# Patient Record
Sex: Male | Born: 1955 | Race: Black or African American | Hispanic: No | Marital: Married | Smoking: Never smoker
Health system: Southern US, Community
[De-identification: ages and names within clinical notes are randomized; demographics above are authoritative.]

## PROBLEM LIST (undated history)

## (undated) DIAGNOSIS — E669 Obesity, unspecified: Secondary | ICD-10-CM

## (undated) DIAGNOSIS — J189 Pneumonia, unspecified organism: Secondary | ICD-10-CM

## (undated) DIAGNOSIS — I5032 Chronic diastolic (congestive) heart failure: Secondary | ICD-10-CM

## (undated) DIAGNOSIS — D649 Anemia, unspecified: Secondary | ICD-10-CM

## (undated) DIAGNOSIS — N189 Chronic kidney disease, unspecified: Secondary | ICD-10-CM

## (undated) DIAGNOSIS — I10 Essential (primary) hypertension: Secondary | ICD-10-CM

## (undated) DIAGNOSIS — G4733 Obstructive sleep apnea (adult) (pediatric): Secondary | ICD-10-CM

## (undated) DIAGNOSIS — J9601 Acute respiratory failure with hypoxia: Secondary | ICD-10-CM

## (undated) HISTORY — DX: Obstructive sleep apnea (adult) (pediatric): G47.33

## (undated) HISTORY — PX: AV FISTULA PLACEMENT: SHX1204

## (undated) HISTORY — DX: Obesity, unspecified: E66.9

## (undated) HISTORY — DX: Chronic kidney disease, unspecified: N18.9

---

## 2002-01-27 ENCOUNTER — Emergency Department (HOSPITAL_COMMUNITY): Admission: EM | Admit: 2002-01-27 | Discharge: 2002-01-27 | Payer: Self-pay | Admitting: Emergency Medicine

## 2004-11-18 ENCOUNTER — Emergency Department (HOSPITAL_COMMUNITY): Admission: EM | Admit: 2004-11-18 | Discharge: 2004-11-18 | Payer: Self-pay | Admitting: Emergency Medicine

## 2006-08-25 ENCOUNTER — Emergency Department (HOSPITAL_COMMUNITY): Admission: EM | Admit: 2006-08-25 | Discharge: 2006-08-25 | Payer: Self-pay | Admitting: Family Medicine

## 2007-12-14 ENCOUNTER — Emergency Department (HOSPITAL_COMMUNITY): Admission: EM | Admit: 2007-12-14 | Discharge: 2007-12-14 | Payer: Self-pay | Admitting: Emergency Medicine

## 2007-12-16 ENCOUNTER — Emergency Department (HOSPITAL_COMMUNITY): Admission: EM | Admit: 2007-12-16 | Discharge: 2007-12-16 | Payer: Self-pay | Admitting: Family Medicine

## 2008-01-11 ENCOUNTER — Encounter
Admission: RE | Admit: 2008-01-11 | Discharge: 2008-01-11 | Payer: Self-pay | Admitting: Physical Medicine and Rehabilitation

## 2008-03-08 ENCOUNTER — Emergency Department (HOSPITAL_COMMUNITY): Admission: EM | Admit: 2008-03-08 | Discharge: 2008-03-08 | Payer: Self-pay | Admitting: Emergency Medicine

## 2011-06-16 LAB — POCT I-STAT, CHEM 8
BUN: 18
Calcium, Ion: 1.12
Chloride: 100
Creatinine, Ser: 1.3
Glucose, Bld: 287 — ABNORMAL HIGH
HCT: 43
Hemoglobin: 14.6
Potassium: 3.4 — ABNORMAL LOW
Sodium: 135
TCO2: 24

## 2011-06-16 LAB — POCT CARDIAC MARKERS
CKMB, poc: 1.1
CKMB, poc: 1.7
Myoglobin, poc: 56.5
Myoglobin, poc: 97.4
Operator id: 272551
Operator id: 277751
Troponin i, poc: <0.05
Troponin i, poc: <0.05

## 2011-06-16 LAB — BASIC METABOLIC PANEL WITH GFR
CO2: 23
Chloride: 99
Creatinine, Ser: 1.1
GFR calc Af Amer: >60
GFR calc non Af Amer: >60
Glucose, Bld: 295 — ABNORMAL HIGH
Potassium: 3.3 — ABNORMAL LOW
Sodium: 133 — ABNORMAL LOW

## 2011-06-16 LAB — DIFFERENTIAL
Monocytes Absolute: 0.5
Neutrophils Relative %: 38 — ABNORMAL LOW

## 2011-06-16 LAB — CBC
Hemoglobin: 14
MCHC: 34.4
RBC: 4.82
RDW: 14

## 2011-06-16 LAB — BASIC METABOLIC PANEL
BUN: 16
Calcium: 9.1

## 2011-06-16 LAB — PROTIME-INR
INR: 0.9
Prothrombin Time: 11.8

## 2011-06-16 LAB — D-DIMER, QUANTITATIVE: D-Dimer, Quant: <0.22

## 2011-06-16 LAB — APTT: aPTT: 23 — ABNORMAL LOW

## 2011-08-08 ENCOUNTER — Emergency Department (HOSPITAL_COMMUNITY)
Admission: EM | Admit: 2011-08-08 | Discharge: 2011-08-09 | Disposition: A | Discharge status: Home / Self Care | Payer: Commercial Indemnity | Attending: Emergency Medicine | Admitting: Emergency Medicine

## 2011-08-08 DIAGNOSIS — R109 Unspecified abdominal pain: Secondary | ICD-10-CM | POA: Insufficient documentation

## 2011-08-08 DIAGNOSIS — J3489 Other specified disorders of nose and nasal sinuses: Secondary | ICD-10-CM | POA: Insufficient documentation

## 2011-08-08 DIAGNOSIS — J029 Acute pharyngitis, unspecified: Secondary | ICD-10-CM | POA: Insufficient documentation

## 2011-08-08 DIAGNOSIS — R05 Cough: Secondary | ICD-10-CM | POA: Insufficient documentation

## 2011-08-08 DIAGNOSIS — R059 Cough, unspecified: Secondary | ICD-10-CM | POA: Insufficient documentation

## 2011-08-08 DIAGNOSIS — R0789 Other chest pain: Secondary | ICD-10-CM

## 2011-08-08 DIAGNOSIS — J40 Bronchitis, not specified as acute or chronic: Secondary | ICD-10-CM

## 2011-08-08 HISTORY — DX: Essential (primary) hypertension: I10

## 2011-08-09 ENCOUNTER — Encounter: Payer: Self-pay | Admitting: *Deleted

## 2011-08-09 ENCOUNTER — Emergency Department (HOSPITAL_COMMUNITY): Payer: Commercial Indemnity

## 2011-08-09 MED ORDER — ALBUTEROL SULFATE HFA 108 (90 BASE) MCG/ACT IN AERS
2.0000 | INHALATION_SPRAY | Freq: Once | RESPIRATORY_TRACT | Status: AC
  Administered 2011-08-09: 2 via RESPIRATORY_TRACT
  Filled 2011-08-09: qty 6.7

## 2011-08-09 MED ORDER — OXYCODONE-ACETAMINOPHEN 5-325 MG PO TABS
2.0000 | ORAL_TABLET | Freq: Once | ORAL | Status: AC
  Administered 2011-08-09: 2 via ORAL
  Filled 2011-08-09: qty 2

## 2011-08-09 MED ORDER — OXYCODONE-ACETAMINOPHEN 5-325 MG PO TABS: ORAL_TABLET | ORAL | Status: DC

## 2011-08-09 NOTE — ED Notes (Signed)
Patient back from xray.  Using splint pillow when coughing

## 2011-08-09 NOTE — Discharge Instructions (Signed)
 No pneumonia seen on chest xray.  No rib fractures seen.  Use inhaler 2 puffs every 6 hours as needed for difficulty breathing and coughing.      Antibiotic Nonuse  Your caregiver felt that the infection or problem was not one that would be helped with an antibiotic. Infections may be caused by viruses or bacteria. Only a caregiver can tell which one of these is the likely cause of an illness. A cold is the most common cause of infection in both adults and children. A cold is a virus. Antibiotic treatment will have no effect on a viral infection. Viruses can lead to many lost days of work caring for sick children and many missed days of school. Children may catch as many as 10 colds or flus per year during which they can be tearful, cranky, and uncomfortable. The goal of treating a virus is aimed at keeping the ill person comfortable. Antibiotics are medications used to help the body fight bacterial infections. There are relatively few types of bacteria that cause infections but there are hundreds of viruses. While both viruses and bacteria cause infection they are very different types of germs. A viral infection will typically go away by itself within 7 to 10 days. Bacterial infections may spread or get worse without antibiotic treatment. Examples of bacterial infections are:  Sore throats (like strep throat or tonsillitis).   Infection in the lung (pneumonia).   Ear and skin infections.  Examples of viral infections are:  Colds or flus.   Most coughs and bronchitis.   Sore throats not caused by Strep.   Runny noses.  It is often best not to take an antibiotic when a viral infection is the cause of the problem. Antibiotics can kill off the helpful bacteria that we have inside our body and allow harmful bacteria to start growing. Antibiotics can cause side effects such as allergies, nausea, and diarrhea without helping to improve the symptoms of the viral infection. Additionally, repeated  uses of antibiotics can cause bacteria inside of our body to become resistant. That resistance can be passed onto harmful bacterial. The next time you have an infection it may be harder to treat if antibiotics are used when they are not needed. Not treating with antibiotics allows our own immune system to develop and take care of infections more efficiently. Also, antibiotics will work better for us  when they are prescribed for bacterial infections. Treatments for a child that is ill may include:  Give extra fluids throughout the day to stay hydrated.   Get plenty of rest.   Only give your child over-the-counter or prescription medicines for pain, discomfort, or fever as directed by your caregiver.   The use of a cool mist humidifier may help stuffy noses.   Cold medications if suggested by your caregiver.  Your caregiver may decide to start you on an antibiotic if:  The problem you were seen for today continues for a longer length of time than expected.   You develop a secondary bacterial infection.  SEEK MEDICAL CARE IF:  Fever lasts longer than 5 days.   Symptoms continue to get worse after 5 to 7 days or become severe.   Difficulty in breathing develops.   Signs of dehydration develop (poor drinking, rare urinating, dark colored urine).   Changes in behavior or worsening tiredness (listlessness or lethargy).  Document Released: 11/17/2001 Document Revised: 05/21/2011 Document Reviewed: 05/16/2009 [facility] Patient Information 2012 [place], [place].Bronchitis Bronchitis is the body's way of  reacting to injury and/or infection (inflammation) of the bronchi. Bronchi are the air tubes that extend from the windpipe into the lungs. If the inflammation becomes severe, it may cause shortness of breath. CAUSES  Inflammation may be caused by:  A virus.   Germs (bacteria).   Dust.   Allergens.   Pollutants and many other irritants.  The cells lining the bronchial tree are covered  with tiny hairs (cilia). These constantly beat upward, away from the lungs, toward the mouth. This keeps the lungs free of pollutants. When these cells become too irritated and are unable to do their job, mucus begins to develop. This causes the characteristic cough of bronchitis. The cough clears the lungs when the cilia are unable to do their job. Without either of these protective mechanisms, the mucus would settle in the lungs. Then you would develop pneumonia. Smoking is a common cause of bronchitis and can contribute to pneumonia. Stopping this habit is the single most important thing you can do to help yourself. TREATMENT   Your caregiver may prescribe an antibiotic if the cough is caused by bacteria. Also, medicines that open up your airways make it easier to breathe. Your caregiver may also recommend or prescribe an expectorant. It will loosen the mucus to be coughed up. Only take over-the-counter or prescription medicines for pain, discomfort, or fever as directed by your caregiver.   Removing whatever causes the problem (smoking, for example) is critical to preventing the problem from getting worse.   Cough suppressants may be prescribed for relief of cough symptoms.   Inhaled medicines may be prescribed to help with symptoms now and to help prevent problems from returning.   For those with recurrent (chronic) bronchitis, there may be a need for steroid medicines.  SEEK IMMEDIATE MEDICAL CARE IF:   During treatment, you develop more pus-like mucus (purulent sputum).   You have a fever.   Your baby is older than 3 months with a rectal temperature of 102 F (38.9 C) or higher.   Your baby is 52 months old or younger with a rectal temperature of 100.4 F (38 C) or higher.   You become progressively more ill.   You have increased difficulty breathing, wheezing, or shortness of breath.  It is necessary to seek immediate medical care if you are elderly or sick from any other  disease. MAKE SURE YOU:   Understand these instructions.   Will watch your condition.   Will get help right away if you are not doing well or get worse.  Document Released: 09/08/2005 Document Revised: 05/21/2011 Document Reviewed: 07/18/2008 [facility] Patient Information 2012 [place], [place].Bronchitis Bronchitis is the body's way of reacting to injury and/or infection (inflammation) of the bronchi. Bronchi are the air tubes that extend from the windpipe into the lungs. If the inflammation becomes severe, it may cause shortness of breath. CAUSES  Inflammation may be caused by:  A virus.   Germs (bacteria).   Dust.   Allergens.   Pollutants and many other irritants.  The cells lining the bronchial tree are covered with tiny hairs (cilia). These constantly beat upward, away from the lungs, toward the mouth. This keeps the lungs free of pollutants. When these cells become too irritated and are unable to do their job, mucus begins to develop. This causes the characteristic cough of bronchitis. The cough clears the lungs when the cilia are unable to do their job. Without either of these protective mechanisms, the mucus would settle in the lungs.  Then you would develop pneumonia. Smoking is a common cause of bronchitis and can contribute to pneumonia. Stopping this habit is the single most important thing you can do to help yourself. TREATMENT   Your caregiver may prescribe an antibiotic if the cough is caused by bacteria. Also, medicines that open up your airways make it easier to breathe. Your caregiver may also recommend or prescribe an expectorant. It will loosen the mucus to be coughed up. Only take over-the-counter or prescription medicines for pain, discomfort, or fever as directed by your caregiver.   Removing whatever causes the problem (smoking, for example) is critical to preventing the problem from getting worse.   Cough suppressants may be prescribed for relief of cough  symptoms.   Inhaled medicines may be prescribed to help with symptoms now and to help prevent problems from returning.   For those with recurrent (chronic) bronchitis, there may be a need for steroid medicines.  SEEK IMMEDIATE MEDICAL CARE IF:   During treatment, you develop more pus-like mucus (purulent sputum).   You have a fever.    temperature of 100.4 F (38 C) or higher.   You become progressively more ill.   You have increased difficulty breathing, wheezing, or shortness of breath.  It is necessary to seek immediate medical care if you are elderly or sick from any other disease. MAKE SURE YOU:   Understand these instructions.   Will watch your condition.   Will get help right away if you are not doing well or get worse.  Document Released: 09/08/2005 Document Revised: 05/21/2011 Document Reviewed: 07/18/2008 [facility] Patient Information 2012 [place], [place].

## 2011-08-09 NOTE — ED Notes (Signed)
Patient stated about 930 tonight he started coughing and felt something pop to his right rib/abd area  Pain has continued 10/10

## 2011-08-09 NOTE — ED Provider Notes (Addendum)
History     CSN: [id number] Arrival date & time: 08/08/2011 11:56 PM   First MD Initiated Contact with Patient 08/09/11 0004      Chief Complaint  Patient presents with  . Abdominal Pain    (Consider location/radiation/quality/duration/timing/severity/associated sxs/prior treatment) HPI Comments: Patient with history of hypertension reports he has not taken his evening blood pressure medications. He is in moderate to severe pain in his right lower rib cage which he attributes to severe coughing. He reports that he began coughing earlier today. His granddaughter has been sick recently with similar symptoms. He denies known fevers. He also has been taking some Flexeril due to some left sciatica pains. He reports that he's had paroxysmal coughing described as moderate to severe and reports he began to have some pain in his right lower rib cage. More recently he had a severe episode of coughing and even though he was holding his right side felt like something crack or "busted" on that right side. He denies any radiation of the pain denies any weakness. He denies any sweats or nausea. Pain is not associated with eating. He denies smoking but has had pneumonia in the past, most recently about 2 or 3 years ago diagnosed here in the emergency department. He denies any blurred vision, focal numbness or weakness, slurred speech or any other focal symptoms related to his elevated blood pressure at this time.  Patient is a 55 y.o. male presenting with abdominal pain. The history is provided by the patient and the spouse.  Abdominal Pain The primary symptoms of the illness include abdominal pain. The primary symptoms of the illness do not include fever or shortness of breath.    History reviewed. No pertinent past medical history.  History reviewed. No pertinent past surgical history.  No family history on file.  History  Substance Use Topics  . Smoking status: Not on file  . Smokeless tobacco: Not  on file  . Alcohol Use: Not on file      Review of Systems  Constitutional: Negative for fever.  HENT: Positive for sore throat and rhinorrhea.   Respiratory: Positive for cough. Negative for chest tightness, shortness of breath and wheezing.   Cardiovascular: Positive for chest pain.  Gastrointestinal: Positive for abdominal pain.  Neurological: Negative for headaches.  Psychiatric/Behavioral: Negative for confusion.  All other systems reviewed and are negative.    Allergies  Review of patient's allergies indicates no known allergies.  Home Medications   Current Outpatient Rx  Name Route Sig Dispense Refill  . CARVEDILOL PO Oral Take 1 tablet by mouth 2 (two) times daily.      . CYCLOBENZAPRINE HCL 10 MG PO TABS Oral Take 5 mg by mouth 3 (three) times daily as needed. For muscle spasms.     . FUROSEMIDE PO Oral Take 1 tablet by mouth daily.      [name] Kitchen GLIPIZIDE PO Oral Take 2 tablets by mouth 2 (two) times daily.      [name] Kitchen LISINOPRIL PO Oral Take 1 tablet by mouth daily.      [name] Kitchen NAPROSYN PO Oral Take 1 tablet by mouth daily as needed.      [name] Kitchen SIMVASTATIN PO Oral Take 1 tablet by mouth daily.        BP 235/104  Pulse 73  Temp(Src) 98 F (36.7 C) (Oral)  Resp 18  SpO2 100%  Physical Exam  Constitutional: He appears well-developed and well-nourished. No distress.  HENT:  Head: Normocephalic and atraumatic.  Eyes:  Pupils are equal, round, and reactive to light. No scleral icterus.  Neck: Neck supple.  Cardiovascular: S1 normal, S2 normal and normal heart sounds.  Exam reveals no friction rub.   Pulmonary/Chest: Effort normal. No respiratory distress.         Patient with coughing but he is trying to hold it in 2 to the focal pain. His lung sounds with shallow breathing are clear in all fields. I do not hear a rub.    ED Course  Procedures (including critical care time)  Labs Reviewed - No data to display No results found.   No diagnosis found.  Sat is 100% on RA and  is normal.  MDM    Patient does not have any evidence of endorgan failure therefore I think his blood pressure albeit elevated is related to this he severity of his pain as well as his chronic history of hypertension having not taken his evening medications. He understands that it is important that he continue to take his medications as prescribed. He was focal pain I suspect the patient may have either fractured a rib or had muscular strain of his rib area. Will get rib x-rays as well as a chest x-ray to rule out a new pneumonia. He is afebrile and has a normal room air sat of 100%. Plan is to give patient 2 Percocet and will need a prescription for pain medicines at home as well. If he indeed does have pneumonia but certainly antibiotics will be prescribed as well.    2:02 AM  CXR which I reviewed shows no pneumonia, no rib fractures per radiologist.  Will d./c home with bronchitis and instructions on pain meds and inhaler.    Saddie Benders. Rupa Lagan, MD 08/09/11 0202  2:02 AM Pt's pain is imrpoved.    Saddie Benders. Dorna Mai, MD 08/09/11 [id number]

## 2012-08-03 ENCOUNTER — Encounter (HOSPITAL_BASED_OUTPATIENT_CLINIC_OR_DEPARTMENT_OTHER): Payer: Commercial Indemnity

## 2012-08-09 ENCOUNTER — Ambulatory Visit (HOSPITAL_BASED_OUTPATIENT_CLINIC_OR_DEPARTMENT_OTHER): Payer: Commercial Indemnity | Attending: Otolaryngology | Admitting: Radiology

## 2012-08-09 VITALS — Ht 69.0 in | Wt 255.0 lb

## 2012-08-09 DIAGNOSIS — G4733 Obstructive sleep apnea (adult) (pediatric): Secondary | ICD-10-CM | POA: Insufficient documentation

## 2012-08-14 DIAGNOSIS — R0989 Other specified symptoms and signs involving the circulatory and respiratory systems: Secondary | ICD-10-CM

## 2012-08-14 DIAGNOSIS — G4733 Obstructive sleep apnea (adult) (pediatric): Secondary | ICD-10-CM

## 2012-08-14 DIAGNOSIS — R0609 Other forms of dyspnea: Secondary | ICD-10-CM

## 2012-08-14 NOTE — Procedures (Cosign Needed)
Micheal [name], Micheal [name]                ACCOUNT NO.:  [id number]  MEDICAL RECORD NO.:  [id number]          PATIENT TYPE:  OUT  LOCATION:  SLEEP CENTER                 FACILITY:  [facility]  PHYSICIAN:  Clinton D. Annamaria Boots, MD, FCCP, FACPDATE OF BIRTH:  03-23-58  DATE OF STUDY:  08/09/2012                           NOCTURNAL POLYSOMNOGRAM  REFERRING PHYSICIAN:  Ileene Hutchinson T. Erik Obey, M.D.  REFERRING PHYSICIAN:  Ileene Hutchinson T. Wolicki, MD  This is unattended home sleep study.  The data records are filed in epic in the notes section.  INDICATION FOR STUDY:  Hypersomnia with sleep apnea.  EPWORTH SLEEPINESS SCORE:  4/24.  BMI 38, weight 255 pounds.  Height 5 feet 9 inches, neck 18 inches.  HOME MEDICATIONS:  Reviewed.  IMPRESSION: 1. Severe obstructive sleep apnea/hypopnea syndrome, AHI 71.5 per     hour. 2. Snoring with oxygen desaturation to a nadir of 85% with a mean     saturation through the study of 95% on room air. 3. Regular cardiac rhythm with mean heart rate 68.7 per minute.  RECOMMENDATIONS:  Scores in this range are usually addressed first with CPAP.  Arrangements can be made through the sleep disorders center for dedicated CPAP titration study if appropriate.     Clinton D. Annamaria Boots, MD, [facility], [place], [place] Board of Sleep Medicine    CDY/MEDQ  D:  08/14/2012 10:01:49  T:  08/14/2012 22:32:23  Job:  [id number]

## 2014-02-14 ENCOUNTER — Encounter: Payer: Self-pay | Admitting: Cardiology

## 2014-02-14 ENCOUNTER — Ambulatory Visit (INDEPENDENT_AMBULATORY_CARE_PROVIDER_SITE_OTHER): Payer: Commercial Indemnity | Admitting: Cardiology

## 2014-02-14 VITALS — BP 130/50 | HR 55 | Ht 69.0 in | Wt 261.0 lb

## 2014-02-14 DIAGNOSIS — E669 Obesity, unspecified: Secondary | ICD-10-CM | POA: Insufficient documentation

## 2014-02-14 DIAGNOSIS — I10 Essential (primary) hypertension: Secondary | ICD-10-CM | POA: Insufficient documentation

## 2014-02-14 DIAGNOSIS — N189 Chronic kidney disease, unspecified: Secondary | ICD-10-CM | POA: Insufficient documentation

## 2014-02-14 DIAGNOSIS — G4733 Obstructive sleep apnea (adult) (pediatric): Secondary | ICD-10-CM

## 2014-02-14 NOTE — Progress Notes (Signed)
[place], [place], [place]  [address] Phone: [phone] Fax:  [phone]  Date:  02/14/2014   ID:  Micheal [name], DOB [birth year], MRN [id number]  PCP:  No primary provider on file.  Cardiologist:  Fransico Him, MD     History of Present Illness: Micheal [name] is a 58 y.o. male with a history of HTN, DM and OSA who presents today for evaluation.  He had a PSG done showing severe OSA with an AHI of 71/hr.  He underwent home CPAP titration and the 95th % pressure was 14.3 with an AHI of 3.6/hr.  He has been followed by Dr. Erik Obey.  He is here today at his wife's request.  She says that he is not using the CPAP device.  He can only sleep for about an hour with it and then he says that his arms and hands go numb and starts having drainage and starts to cough.  He had been on Nasonex but has not used it recently.  He feels that the pressure is too high.  He uses a full face mask which he does not like.  He does not think he breathes through his mouth.  His wife says he is snoring.  He feels tired when he gets up in the am and has to nap occasionally during the day.     Wt Readings from Last 3 Encounters:  02/14/14 261 lb (118.389 kg)  08/09/12 255 lb (115.667 kg)     Past Medical History  Diagnosis Date  . Hypertension   . Diabetes mellitus   . OSA (obstructive sleep apnea)     severe with AHI 72/hr  . Obesity (BMI 30-39.9)   . Chronic kidney disease     he is s/p AVF but not on HD yet    Current Outpatient Prescriptions  Medication Sig Dispense Refill  . carvedilol (COREG) 25 MG tablet Take 25 mg by mouth 2 (two) times daily.        . Cholecalciferol (VITAMIN D-3) 5000 UNITS TABS Take 1 tablet by mouth once a week.        . furosemide (LASIX) 20 MG tablet Take 20 mg by mouth daily.        [name] Kitchen glipiZIDE (GLUCOTROL) 10 MG tablet Take 20 mg by mouth 2 (two) times daily.        . hydrALAZINE (APRESOLINE) 50 MG tablet Take 50 mg by mouth 3 (three) times daily.          . insulin NPH (HUMULIN N,NOVOLIN N) 100 UNIT/ML injection Inject 52 Units into the skin 2 (two) times daily.        [name] Kitchen lisinopril (PRINIVIL,ZESTRIL) 40 MG tablet Take 40 mg by mouth daily.        [name] Kitchen omega-3 acid ethyl esters (LOVAZA) 1 G capsule Take 1 g by mouth daily.        [name] Kitchen omeprazole (PRILOSEC) 20 MG capsule Take 20 mg by mouth daily.        . simvastatin (ZOCOR) 20 MG tablet Take 20 mg by mouth daily.         No current facility-administered medications for this visit.    Allergies:   No Known Allergies  Social History:  The patient  reports that he has never smoked. He does not have any smokeless tobacco history on file. He reports that he does not drink alcohol or use illicit drugs.   Family History:  The patient's family history  includes Heart disease in his father; Heart failure in his father and other.   ROS:  Please see the history of present illness.      All other systems reviewed and negative.   PHYSICAL EXAM: VS:  BP 130/50  Pulse 55  Ht 5\' 9"  (1.753 m)  Wt 261 lb (118.389 kg)  BMI 38.53 kg/m2 Well nourished, well developed, in no acute distress HEENT: normal Neck: no JVD Cardiac:  normal S1, S2; RRR; no murmur Lungs:  clear to auscultation bilaterally, no wheezing, rhonchi or rales Abd: soft, nontender, no hepatomegaly Ext: no edema Skin: warm and dry Neuro:  CNs 2-12 intact, no focal abnormalities noted  EKG:  NSR with rightward axis and no ST changes     ASSESSMENT AND PLAN:  1. Severe OSA on CPAP at 14cm H2O but not tolerating well - he feels he is getting too much air and is also not tolerating the mask.  He still has daytime sleepiness to the point that he has to take a nap - I have encouarged him to use Nasonex 1 spray each nostril daily and nasal saline spray BID - I will set him up for an in lab CPAP titration using a nasal pillow mask with chin strap 2. HTN well controlled - continue carvedilol/Hydralazine/lisinopril 3. Obesity  Followup with me  in 6 months  Signed, Fransico Him, MD 02/14/2014 10:04 AM

## 2014-02-14 NOTE — Addendum Note (Signed)
Addended by: Alvina Filbert B on: 02/14/2014 11:06 AM   Modules accepted: Orders

## 2014-02-14 NOTE — Patient Instructions (Signed)
START USING YOUR NASONEX 1 SPRAY EACH NOSTRIL DAILY  START NASAL SALINE 1 SPRAY EACH NOSTRIL TWICE A DAY  Will arrange for CPAP titration and call you with the information  Your physician wants you to follow-up in: 6 month ov You will receive a reminder letter in the mail two months in advance. If you don't receive a letter, please call our office to schedule the follow-up appointment.

## 2014-02-15 ENCOUNTER — Other Ambulatory Visit: Payer: Self-pay | Admitting: *Deleted

## 2014-02-15 MED ORDER — MOMETASONE FUROATE 50 MCG/ACT NA SUSP: NASAL | Status: DC

## 2014-03-13 ENCOUNTER — Telehealth: Payer: Self-pay | Admitting: Cardiology

## 2014-03-13 NOTE — Telephone Encounter (Signed)
New message    From last office visit patient was suppose to be set up for another sleep study.    Wife calling checking on status.

## 2014-03-13 NOTE — Telephone Encounter (Signed)
lmtrc

## 2014-03-13 NOTE — Telephone Encounter (Signed)
Called [facility] and spoke with AMY and they are waiting on PA from [place] still. They will schedule as soon as they receive PA approval  Will call pt and make aware.

## 2014-03-13 NOTE — Telephone Encounter (Signed)
Spoke to pts wife and made aware. She is going to call [place] and see why they are taking so long to PA. I told her that if [employer] needs anything they need to call us to make [place] aware.

## 2014-04-07 ENCOUNTER — Other Ambulatory Visit: Payer: Self-pay | Admitting: General Surgery

## 2014-04-07 DIAGNOSIS — G4733 Obstructive sleep apnea (adult) (pediatric): Secondary | ICD-10-CM

## 2014-05-26 ENCOUNTER — Encounter (HOSPITAL_BASED_OUTPATIENT_CLINIC_OR_DEPARTMENT_OTHER): Payer: Commercial Indemnity

## 2014-05-26 ENCOUNTER — Telehealth: Payer: Self-pay | Admitting: Cardiology

## 2014-05-26 NOTE — Telephone Encounter (Signed)
Pt's sleep study canceled.  Jodi Mourning, CMA for Dr. Fransico Him left message for pt to call back to complete info needed for [employer] to precert sleep study.  Waiting on pt to return call

## 2015-02-16 ENCOUNTER — Encounter (HOSPITAL_BASED_OUTPATIENT_CLINIC_OR_DEPARTMENT_OTHER): Payer: Self-pay

## 2015-02-16 ENCOUNTER — Emergency Department (HOSPITAL_BASED_OUTPATIENT_CLINIC_OR_DEPARTMENT_OTHER)
Admission: EM | Admit: 2015-02-16 | Discharge: 2015-02-16 | Disposition: A | Discharge status: Home / Self Care | Payer: 59 | Attending: Emergency Medicine | Admitting: Emergency Medicine

## 2015-02-16 DIAGNOSIS — N189 Chronic kidney disease, unspecified: Secondary | ICD-10-CM | POA: Insufficient documentation

## 2015-02-16 DIAGNOSIS — E119 Type 2 diabetes mellitus without complications: Secondary | ICD-10-CM | POA: Diagnosis not present

## 2015-02-16 DIAGNOSIS — I129 Hypertensive chronic kidney disease with stage 1 through stage 4 chronic kidney disease, or unspecified chronic kidney disease: Secondary | ICD-10-CM | POA: Diagnosis not present

## 2015-02-16 DIAGNOSIS — Z794 Long term (current) use of insulin: Secondary | ICD-10-CM | POA: Insufficient documentation

## 2015-02-16 DIAGNOSIS — E669 Obesity, unspecified: Secondary | ICD-10-CM | POA: Insufficient documentation

## 2015-02-16 DIAGNOSIS — Z79899 Other long term (current) drug therapy: Secondary | ICD-10-CM | POA: Insufficient documentation

## 2015-02-16 DIAGNOSIS — H81399 Other peripheral vertigo, unspecified ear: Secondary | ICD-10-CM | POA: Insufficient documentation

## 2015-02-16 DIAGNOSIS — R42 Dizziness and giddiness: Secondary | ICD-10-CM | POA: Diagnosis present

## 2015-02-16 LAB — CBC WITH DIFFERENTIAL/PLATELET
BASOS ABS: 0.1 10*3/uL (ref 0.0–0.1)
Basophils Relative: 1 % (ref 0–1)
Eosinophils Absolute: 0.2 10*3/uL (ref 0.0–0.7)
Eosinophils Relative: 2 % (ref 0–5)
HCT: 29.9 % — ABNORMAL LOW (ref 39.0–52.0)
Hemoglobin: 9.9 g/dL — ABNORMAL LOW (ref 13.0–17.0)
LYMPHS ABS: 2 10*3/uL (ref 0.7–4.0)
Lymphocytes Relative: 22 % (ref 12–46)
MCH: 26.8 pg (ref 26.0–34.0)
MCHC: 33.1 g/dL (ref 30.0–36.0)
MCV: 81 fL (ref 78.0–100.0)
MONO ABS: 0.8 10*3/uL (ref 0.1–1.0)
Monocytes Relative: 9 % (ref 3–12)
NEUTROS ABS: 6.1 10*3/uL (ref 1.7–7.7)
NEUTROS PCT: 66 % (ref 43–77)
Platelets: 257 10*3/uL (ref 150–400)
RBC: 3.69 MIL/uL — AB (ref 4.22–5.81)
RDW: 14.6 % (ref 11.5–15.5)
WBC: 9.2 10*3/uL (ref 4.0–10.5)

## 2015-02-16 LAB — BASIC METABOLIC PANEL
Anion gap: 10 (ref 5–15)
BUN: 76 mg/dL — ABNORMAL HIGH (ref 6–20)
CO2: 22 mmol/L (ref 22–32)
CREATININE: 4.86 mg/dL — AB (ref 0.61–1.24)
Calcium: 8.3 mg/dL — ABNORMAL LOW (ref 8.9–10.3)
Chloride: 103 mmol/L (ref 101–111)
GFR calc Af Amer: 14 mL/min — ABNORMAL LOW (ref >60)
GFR, EST NON AFRICAN AMERICAN: 12 mL/min — AB (ref >60)
Glucose, Bld: 191 mg/dL — ABNORMAL HIGH (ref 65–99)
Potassium: 4.6 mmol/L (ref 3.5–5.1)
Sodium: 135 mmol/L (ref 135–145)

## 2015-02-16 LAB — CBG MONITORING, ED: Glucose-Capillary: 185 mg/dL — ABNORMAL HIGH (ref 65–99)

## 2015-02-16 MED ORDER — MECLIZINE HCL 25 MG PO TABS: 25.0000 mg | ORAL_TABLET | Freq: Three times a day (TID) | ORAL | Status: DC | PRN

## 2015-02-16 MED ORDER — MECLIZINE HCL 25 MG PO TABS
25.0000 mg | ORAL_TABLET | Freq: Once | ORAL | Status: AC
  Administered 2015-02-16: 25 mg via ORAL
  Filled 2015-02-16: qty 1

## 2015-02-16 NOTE — Discharge Instructions (Signed)
Benign Positional Vertigo Vertigo means you feel like you or your surroundings are moving when they are not. Benign positional vertigo is the most common form of vertigo. Benign means that the cause of your condition is not serious. Benign positional vertigo is more common in older adults. CAUSES  Benign positional vertigo is the result of an upset in the labyrinth system. This is an area in the middle ear that helps control your balance. This may be caused by a viral infection, head injury, or repetitive motion. However, often no specific cause is found. SYMPTOMS  Symptoms of benign positional vertigo occur when you move your head or eyes in different directions. Some of the symptoms may include:  Loss of balance and falls.  Vomiting.  Blurred vision.  Dizziness.  Nausea.  Involuntary eye movements (nystagmus). DIAGNOSIS  Benign positional vertigo is usually diagnosed by physical exam. If the specific cause of your benign positional vertigo is unknown, your caregiver may perform imaging tests, such as magnetic resonance imaging (MRI) or computed tomography (CT). TREATMENT  Your caregiver may recommend movements or procedures to correct the benign positional vertigo. Medicines such as meclizine, benzodiazepines, and medicines for nausea may be used to treat your symptoms. In rare cases, if your symptoms are caused by certain conditions that affect the inner ear, you may need surgery. HOME CARE INSTRUCTIONS   Follow your caregiver's instructions.  Move slowly. Do not make sudden body or head movements.  Avoid driving.  Avoid operating heavy machinery.  Avoid performing any tasks that would be dangerous to you or others during a vertigo episode.  Drink enough fluids to keep your urine clear or pale yellow. SEEK IMMEDIATE MEDICAL CARE IF:   You develop problems with walking, weakness, numbness, or using your arms, hands, or legs.  You have difficulty speaking.  You develop  severe headaches.  Your nausea or vomiting continues or gets worse.  You develop visual changes.  Your family or friends notice any behavioral changes.  Your condition gets worse.  You have a fever.  You develop a stiff neck or sensitivity to light. MAKE SURE YOU:   Understand these instructions.  Will watch your condition.  Will get help right away if you are not doing well or get worse. Document Released: 06/16/2006 Document Revised: 12/01/2011 Document Reviewed: 05/29/2011 [employer] Patient Information 2015 [employer]. This information is not intended to replace advice given to you by your health care provider. Make sure you discuss any questions you have with your health care provider.    

## 2015-02-16 NOTE — ED Notes (Signed)
Pt ambulated per EDP order - able to ambulate without difficulty - steady gait - pt denies feeling dizzy with ambulation.

## 2015-02-16 NOTE — ED Provider Notes (Signed)
CSN: [id number]     Arrival date & time 02/16/15  1716 History   First MD Initiated Contact with Patient 02/16/15 1722     Chief Complaint  Patient presents with  . Dizziness     (Consider location/radiation/quality/duration/timing/severity/associated sxs/prior Treatment) Patient is a 59 y.o. male presenting with dizziness. The history is provided by the patient.  Dizziness Quality:  Imbalance Severity:  Moderate Onset quality:  Gradual Duration:  1 day Timing:  Intermittent Progression:  Unchanged Chronicity:  New Context: bending over, physical activity and standing up   Relieved by:  Being still Worsened by:  Movement and standing up Ineffective treatments:  None tried Associated symptoms: nausea   Associated symptoms: no chest pain, no diarrhea, no headaches, no palpitations, no shortness of breath, no vision changes, no vomiting and no weakness   Associated symptoms comment:  No unilateral numbness or weakness. No speech or swallowing difficulty. Last week patient had a headache and sinus congestion and started amoxicillin on Monday. Headache has resolved still some mild sinus congestion and still currently taking amoxicillin. Risk factors: no hx of stroke and no hx of vertigo   Risk factors comment:  Chronic kidney disease with fistula placed but not on dialysis. Taking all of his medications as prescribed and has been on those medications for a while except for the amoxicillin which is new   Past Medical History  Diagnosis Date  . Diabetes mellitus   . OSA (obstructive sleep apnea)     severe with AHI 72/hr  . Obesity (BMI 30-39.9)   . Chronic kidney disease     he is s/p AVF but not on HD yet  . Hypertension    Past Surgical History  Procedure Laterality Date  . Av fistula placement     Family History  Problem Relation Age of Onset  . Heart failure Father   . Heart disease Father   . Heart failure Other    History  Substance Use Topics  . Smoking status:  Never Smoker   . Smokeless tobacco: Not on file  . Alcohol Use: No    Review of Systems  Respiratory: Negative for shortness of breath.   Cardiovascular: Negative for chest pain and palpitations.  Gastrointestinal: Positive for nausea. Negative for vomiting and diarrhea.  Neurological: Positive for dizziness. Negative for weakness and headaches.  All other systems reviewed and are negative.     Allergies  Review of patient's allergies indicates no known allergies.  Home Medications   Prior to Admission medications   Medication Sig Start Date End Date Taking? Authorizing Provider  carvedilol (COREG) 25 MG tablet Take 25 mg by mouth 2 (two) times daily.      Historical Provider, MD  Cholecalciferol (VITAMIN D-3) 5000 UNITS TABS Take 1 tablet by mouth once a week.      Historical Provider, MD  furosemide (LASIX) 20 MG tablet Take 20 mg by mouth daily.      Historical Provider, MD  glipiZIDE (GLUCOTROL) 10 MG tablet Take 20 mg by mouth 2 (two) times daily.      Historical Provider, MD  hydrALAZINE (APRESOLINE) 50 MG tablet Take 50 mg by mouth 3 (three) times daily.      Historical Provider, MD  insulin NPH (HUMULIN N,NOVOLIN N) 100 UNIT/ML injection Inject 52 Units into the skin 2 (two) times daily.      Historical Provider, MD  lisinopril (PRINIVIL,ZESTRIL) 40 MG tablet Take 40 mg by mouth daily.  Historical Provider, MD  mometasone (NASONEX) 50 MCG/ACT nasal spray 1 spray each nostril daily 02/15/14   Sueanne Margarita, MD  omega-3 acid ethyl esters (LOVAZA) 1 G capsule Take 1 g by mouth daily.      Historical Provider, MD  omeprazole (PRILOSEC) 20 MG capsule Take 20 mg by mouth daily.      Historical Provider, MD  SALINE NASAL SPRAY NA Place 1 spray into the nose 2 (two) times daily. 1 spray in each nostril    Historical Provider, MD  simvastatin (ZOCOR) 20 MG tablet Take 20 mg by mouth daily.      Historical Provider, MD   BP 174/66 mmHg  Pulse 72  Temp(Src) 98.3 F (36.8 C)  (Oral)  Resp 18  Ht 5\' 9"  (1.753 m)  Wt 260 lb (117.935 kg)  BMI 38.38 kg/m2  SpO2 100% Physical Exam  Constitutional: He is oriented to person, place, and time. He appears well-developed and well-nourished. No distress.  OBese  HENT:  Head: Normocephalic and atraumatic.  Right Ear: A middle ear effusion is present.  Left Ear:  No middle ear effusion.  Nose: Mucosal edema present. Right sinus exhibits no maxillary sinus tenderness and no frontal sinus tenderness. Left sinus exhibits no maxillary sinus tenderness and no frontal sinus tenderness.  Mouth/Throat: Oropharynx is clear and moist.  Eyes: Conjunctivae and EOM are normal. Pupils are equal, round, and reactive to light.  Neck: Normal range of motion. Neck supple.  Cardiovascular: Normal rate, regular rhythm and intact distal pulses.   No murmur heard. Pulmonary/Chest: Effort normal and breath sounds normal. No respiratory distress. He has no wheezes. He has no rales.  Abdominal: Soft. He exhibits no distension. There is no tenderness. There is no rebound and no guarding.  Musculoskeletal: Normal range of motion. He exhibits edema. He exhibits no tenderness.  Trace edema in bilateral lower extremities.  Fistula present in the left wrist with palpable thrill  Neurological: He is alert and oriented to person, place, and time. He has normal strength. No cranial nerve deficit or sensory deficit. Coordination normal.  No pronator drift. Normal heel-to-shin testing. Normal gait.  Skin: Skin is warm and dry. No rash noted. No erythema.  Psychiatric: He has a normal mood and affect. His behavior is normal.  Nursing note and vitals reviewed.   ED Course  Procedures (including critical care time) Labs Review Labs Reviewed  CBC WITH DIFFERENTIAL/PLATELET - Abnormal; Notable for the following:    RBC 3.69 (*)    Hemoglobin 9.9 (*)    HCT 29.9 (*)    All other components within normal limits  BASIC METABOLIC PANEL - Abnormal; Notable  for the following:    Glucose, Bld 191 (*)    BUN 76 (*)    Creatinine, Ser 4.86 (*)    Calcium 8.3 (*)    GFR calc non Af Amer 12 (*)    GFR calc Af Amer 14 (*)    All other components within normal limits  CBG MONITORING, ED - Abnormal; Notable for the following:    Glucose-Capillary 185 (*)    All other components within normal limits    Imaging Review No results found.   EKG Interpretation   Date/Time:  Friday Feb 16 2015 17:28:09 EDT Ventricular Rate:  69 PR Interval:  160 QRS Duration: 86 QT Interval:  418 QTC Calculation: 447 R Axis:   103 Text Interpretation:  Normal sinus rhythm with sinus arrhythmia Rightward  axis No previous tracing  Confirmed by Maryan Rued  MD, Loree Fee ([id number]) on  02/16/2015 5:34:31 PM      MDM   Final diagnoses:  Peripheral vertigo, unspecified laterality    Pt with sx most consistent with peripheral vertigo that has worsened today.  States recently started treatment with amoxicillin for sinus infection and using nasal spray but feels dizzy.  No systemic or infectious sx.  Normal neuro exam without weakness, ataxia or cerebellar findings on exam.  Normal vision.  Sx are reproducible with attempting to walk.  No hx of Stroke and low likelihood however pt does have MMP including CKD with graft placement 1 year ago but not on dialysis, DM and HTN.   Patient denies chest pain, worsening swelling, shortness of breath or any changes in his urination making worsening of his renal disease less likely. We'll check a CBC and BMP. His wife went to get records from the [place] of his normal renal function. Will treat for peripheral vertigo and re-eval.    6:32 PM Patient's labs today are stable compared to his labs from the [place] done earlier in April. There's been no change. Discussed with patient the dose of amoxicillin he's taking is actually too high and based on his renal function of 20% he needs to decrease the amoxicillin to twice a day which she will do.  After meclizine patient is significantly improved and able to ambulate without difficulty.  Blanchie Dessert, MD 02/16/15 [phone]

## 2015-02-16 NOTE — ED Notes (Signed)
Pt reports one week of dizziness - intermittent, worsened today - states treated over a week ago by Urgent Care for headache/facial pain - medication for sinus infection. Pt alert, oriented x4, FAST screen negative. Dizziness worsens with movement, intermittent nausea.

## 2015-02-16 NOTE — ED Notes (Signed)
MD at bedside. 

## 2015-03-20 ENCOUNTER — Ambulatory Visit: Payer: Non-veteran care | Admitting: Cardiology

## 2015-03-21 ENCOUNTER — Encounter: Payer: Self-pay | Admitting: Cardiology

## 2015-03-29 ENCOUNTER — Encounter: Payer: Self-pay | Admitting: Cardiology

## 2015-05-25 ENCOUNTER — Encounter (HOSPITAL_BASED_OUTPATIENT_CLINIC_OR_DEPARTMENT_OTHER): Payer: Self-pay | Admitting: *Deleted

## 2015-05-25 ENCOUNTER — Emergency Department (HOSPITAL_BASED_OUTPATIENT_CLINIC_OR_DEPARTMENT_OTHER)
Admission: EM | Admit: 2015-05-25 | Discharge: 2015-05-25 | Disposition: A | Discharge status: Home / Self Care | Payer: 59 | Attending: Emergency Medicine | Admitting: Emergency Medicine

## 2015-05-25 ENCOUNTER — Emergency Department (HOSPITAL_BASED_OUTPATIENT_CLINIC_OR_DEPARTMENT_OTHER): Payer: 59

## 2015-05-25 DIAGNOSIS — E669 Obesity, unspecified: Secondary | ICD-10-CM | POA: Diagnosis not present

## 2015-05-25 DIAGNOSIS — N189 Chronic kidney disease, unspecified: Secondary | ICD-10-CM | POA: Diagnosis not present

## 2015-05-25 DIAGNOSIS — Z79899 Other long term (current) drug therapy: Secondary | ICD-10-CM | POA: Diagnosis not present

## 2015-05-25 DIAGNOSIS — Z8669 Personal history of other diseases of the nervous system and sense organs: Secondary | ICD-10-CM | POA: Insufficient documentation

## 2015-05-25 DIAGNOSIS — Z7951 Long term (current) use of inhaled steroids: Secondary | ICD-10-CM | POA: Diagnosis not present

## 2015-05-25 DIAGNOSIS — E119 Type 2 diabetes mellitus without complications: Secondary | ICD-10-CM | POA: Diagnosis not present

## 2015-05-25 DIAGNOSIS — I129 Hypertensive chronic kidney disease with stage 1 through stage 4 chronic kidney disease, or unspecified chronic kidney disease: Secondary | ICD-10-CM | POA: Insufficient documentation

## 2015-05-25 DIAGNOSIS — F419 Anxiety disorder, unspecified: Secondary | ICD-10-CM | POA: Diagnosis not present

## 2015-05-25 DIAGNOSIS — Z794 Long term (current) use of insulin: Secondary | ICD-10-CM | POA: Diagnosis not present

## 2015-05-25 LAB — COMPREHENSIVE METABOLIC PANEL
ALBUMIN: 3.8 g/dL (ref 3.5–5.0)
ALK PHOS: 64 U/L (ref 38–126)
ALT: 20 U/L (ref 17–63)
AST: 22 U/L (ref 15–41)
Anion gap: 13 (ref 5–15)
BUN: 70 mg/dL — ABNORMAL HIGH (ref 6–20)
CALCIUM: 8 mg/dL — AB (ref 8.9–10.3)
CHLORIDE: 104 mmol/L (ref 101–111)
CO2: 20 mmol/L — AB (ref 22–32)
CREATININE: 5.78 mg/dL — AB (ref 0.61–1.24)
GFR calc Af Amer: 11 mL/min — ABNORMAL LOW (ref >60)
GFR calc non Af Amer: 10 mL/min — ABNORMAL LOW (ref >60)
GLUCOSE: 206 mg/dL — AB (ref 65–99)
Potassium: 4.7 mmol/L (ref 3.5–5.1)
SODIUM: 137 mmol/L (ref 135–145)
Total Bilirubin: 0.4 mg/dL (ref 0.3–1.2)
Total Protein: 7.7 g/dL (ref 6.5–8.1)

## 2015-05-25 LAB — CBC
HCT: 28 % — ABNORMAL LOW (ref 39.0–52.0)
HEMOGLOBIN: 9.2 g/dL — AB (ref 13.0–17.0)
MCH: 27.3 pg (ref 26.0–34.0)
MCHC: 32.9 g/dL (ref 30.0–36.0)
MCV: 83.1 fL (ref 78.0–100.0)
Platelets: 241 10*3/uL (ref 150–400)
RBC: 3.37 MIL/uL — AB (ref 4.22–5.81)
RDW: 14.8 % (ref 11.5–15.5)
WBC: 10.1 10*3/uL (ref 4.0–10.5)

## 2015-05-25 LAB — TROPONIN I: Troponin I: <0.03 ng/mL (ref <0.031)

## 2015-05-25 LAB — TSH: TSH: 1.374 u[IU]/mL (ref 0.350–4.500)

## 2015-05-25 NOTE — ED Notes (Signed)
Pt and family sts he was driving in the car tonight and suddenly pulled over and got out of the car because "he thought he was going to freak out." Pt denies CP, SOB, dizzy, nausea, or feeling like his heart was racing. Pt unable to describe exactly how he's feeling. Pt very vague in describing incident.

## 2015-05-25 NOTE — ED Notes (Signed)
MD at bedside. 

## 2015-05-25 NOTE — Discharge Instructions (Signed)

## 2015-05-25 NOTE — ED Provider Notes (Signed)
CSN: [id number]     Arrival date & time 05/25/15  1902 History  This chart was scribe for Evelina Bucy, MD by Judithann Sauger, ED Scribe. The patient was seen in room MH02/MH02 and the patient's care was started at 7:32 PM.    Chief Complaint  Patient presents with  . Anxiety   Patient is a 59 y.o. male presenting with anxiety. The history is provided by the patient. No language interpreter was used.  Anxiety This is a recurrent problem. The current episode started more than 1 week ago. The problem occurs constantly. The problem has been resolved. Pertinent negatives include no chest pain and no shortness of breath. The symptoms are aggravated by stress. Nothing relieves the symptoms. He has tried nothing for the symptoms.   HPI Comments: DAMONEY WOZNY is a 59 y.o. male with a hx of DM, HTN, and chronic kidney disease who presents to the Emergency Department complaining of anxiety attacks that usually lasts for about 15-20 seconds onset 1 week ago. He reports that tonight, he was driving in the car and had a panic attack, feeling as if something is wrong and being nervous about something. He states that he has had a couple similar episodes this week. He denies CP or chest pressure, tachycardia, palpitations, SOB, diaphoresis, N/V/D, or dizziness. He also denies any fighting or stress. He denies any past medical hx of MI but adds that his grandmother died from an MI. He reports that he takes medication for his swollen bilateral feet and it has not increased in size.   Past Medical History  Diagnosis Date  . Diabetes mellitus   . OSA (obstructive sleep apnea)     severe with AHI 72/hr  . Obesity (BMI 30-39.9)   . Chronic kidney disease     he is s/p AVF but not on HD yet  . Hypertension    Past Surgical History  Procedure Laterality Date  . Av fistula placement     Family History  Problem Relation Age of Onset  . Heart failure Father   . Heart disease Father   . Heart failure Other     Social History  Substance Use Topics  . Smoking status: Never Smoker   . Smokeless tobacco: None  . Alcohol Use: No    Review of Systems  Constitutional: Negative for diaphoresis.  Respiratory: Negative for chest tightness and shortness of breath.   Cardiovascular: Negative for chest pain and palpitations.  Gastrointestinal: Negative for nausea and diarrhea.  Neurological: Negative for dizziness.  Psychiatric/Behavioral: The patient is nervous/anxious (has since resolved).   All other systems reviewed and are negative.     Allergies  Review of patient's allergies indicates no known allergies.  Home Medications   Prior to Admission medications   Medication Sig Start Date End Date Taking? Authorizing Provider  calcium acetate (PHOSLO) 667 MG capsule Take by mouth 3 (three) times daily with meals.   Yes Historical Provider, MD  carvedilol (COREG) 25 MG tablet Take 25 mg by mouth 2 (two) times daily.      Historical Provider, MD  Cholecalciferol (VITAMIN D-3) 5000 UNITS TABS Take 1 tablet by mouth once a week.      Historical Provider, MD  furosemide (LASIX) 20 MG tablet Take 40 mg by mouth daily.     Historical Provider, MD  glipiZIDE (GLUCOTROL) 10 MG tablet Take 20 mg by mouth 2 (two) times daily.      Historical Provider, MD  hydrALAZINE (APRESOLINE)  50 MG tablet Take 50 mg by mouth 3 (three) times daily.      Historical Provider, MD  insulin NPH (HUMULIN N,NOVOLIN N) 100 UNIT/ML injection Inject 52 Units into the skin 2 (two) times daily.      Historical Provider, MD  lisinopril (PRINIVIL,ZESTRIL) 40 MG tablet Take 40 mg by mouth daily.      Historical Provider, MD  meclizine (ANTIVERT) 25 MG tablet Take 1 tablet (25 mg total) by mouth 3 (three) times daily as needed for dizziness. 02/16/15   Blanchie Dessert, MD  mometasone (NASONEX) 50 MCG/ACT nasal spray 1 spray each nostril daily 02/15/14   Sueanne Margarita, MD  omega-3 acid ethyl esters (LOVAZA) 1 G capsule Take 1 g by mouth  daily.      Historical Provider, MD  omeprazole (PRILOSEC) 20 MG capsule Take 20 mg by mouth daily.      Historical Provider, MD  SALINE NASAL SPRAY NA Place 1 spray into the nose 2 (two) times daily. 1 spray in each nostril    Historical Provider, MD  simvastatin (ZOCOR) 20 MG tablet Take 20 mg by mouth daily.      Historical Provider, MD   BP 172/55 mmHg  Pulse 73  Temp(Src) 99 F (37.2 C) (Oral)  Resp 18  Ht 5\' 9"  (1.753 m)  Wt 276 lb (125.193 kg)  BMI 40.74 kg/m2  SpO2 100% Physical Exam  Constitutional: He is oriented to person, place, and time. He appears well-developed and well-nourished. No distress.  HENT:  Head: Normocephalic and atraumatic.  Mouth/Throat: No oropharyngeal exudate.  Eyes: EOM are normal. Pupils are equal, round, and reactive to light.  Neck: Normal range of motion. Neck supple.  Cardiovascular: Normal rate and regular rhythm.  Exam reveals no friction rub.   No murmur heard. Pulmonary/Chest: Effort normal and breath sounds normal. No respiratory distress. He has no wheezes. He has no rales.  Abdominal: Soft. He exhibits no distension. There is no tenderness. There is no rebound.  Musculoskeletal: Normal range of motion. He exhibits no edema.  Neurological: He is alert and oriented to person, place, and time. No cranial nerve deficit. He exhibits normal muscle tone. Coordination normal.  Skin: No rash noted. He is not diaphoretic.  Nursing note and vitals reviewed.   ED Course  Procedures (including critical care time) DIAGNOSTIC STUDIES: Oxygen Saturation is 100% on RA, normal by my interpretation.    COORDINATION OF CARE: 7:40 PM- Pt advised of plan for treatment and pt agrees.    Labs Review Labs Reviewed  CBC  COMPREHENSIVE METABOLIC PANEL  TROPONIN I    Imaging Review No results found. I have personally reviewed and evaluated these images and lab results as part of my medical decision-making.   EKG Interpretation   Date/Time:  Friday  May 25 2015 19:15:13 EDT Ventricular Rate:  71 PR Interval:  152 QRS Duration: 88 QT Interval:  414 QTC Calculation: 449 R Axis:   109 Text Interpretation:  Normal sinus rhythm Rightward axis Borderline ECG No  significant change since last tracing Confirmed by Mingo Amber  MD, [place]  ([id number]) on 05/25/2015 7:23:40 PM      MDM   Final diagnoses:  Anxiety    59 year old male with hypertension and diabetes presents with concern for anxiety attacks. He's had multiple episodes of the past 2 weeks where he'll have a sensation of flushing with nausea. He denies chest pain or pressure, shortness of breath, diaphoresis. He had his most severe episode  tonight, a last 30 seconds. Denies any dizziness or feelings of syncope.  here he is well appearing. Vitals are stable. He has no history of heart problems. He does have some risk factors for coronary artery disease. This is seems like very atypical coronary symptoms since he has no chest pain or shortness of breath. Will obtain labs. EKG is normal.   Labs ok. Stable for discharge.   I personally performed the services described in this documentation, which was scribed in my presence. The recorded information has been reviewed and is accurate.     Evelina Bucy, MD 05/25/15 [phone]

## 2015-05-30 ENCOUNTER — Telehealth: Payer: Self-pay | Admitting: Physician Assistant

## 2015-05-30 ENCOUNTER — Ambulatory Visit (INDEPENDENT_AMBULATORY_CARE_PROVIDER_SITE_OTHER): Payer: 59 | Admitting: Physician Assistant

## 2015-05-30 ENCOUNTER — Encounter: Payer: Self-pay | Admitting: Physician Assistant

## 2015-05-30 ENCOUNTER — Ambulatory Visit (INDEPENDENT_AMBULATORY_CARE_PROVIDER_SITE_OTHER): Payer: 59

## 2015-05-30 VITALS — BP 166/68 | HR 63 | Ht 69.0 in | Wt 271.2 lb

## 2015-05-30 DIAGNOSIS — R002 Palpitations: Secondary | ICD-10-CM

## 2015-05-30 DIAGNOSIS — G4733 Obstructive sleep apnea (adult) (pediatric): Secondary | ICD-10-CM | POA: Diagnosis not present

## 2015-05-30 DIAGNOSIS — R011 Cardiac murmur, unspecified: Secondary | ICD-10-CM | POA: Diagnosis not present

## 2015-05-30 DIAGNOSIS — Z9989 Dependence on other enabling machines and devices: Secondary | ICD-10-CM

## 2015-05-30 MED ORDER — HYDRALAZINE HCL 50 MG PO TABS: 75.0000 mg | ORAL_TABLET | Freq: Three times a day (TID) | ORAL | Status: DC

## 2015-05-30 NOTE — Progress Notes (Signed)
Cardiology Office Note   Date:  05/30/2015   ID:  Micheal [name], DOB 10-10-1955, MRN [id number]  PCP:  Lynne Logan, MD  Cardiologist:  new Rosaria Ferries, PA-C   Chief Complaint  Patient presents with  . Advice Only    patient having anxiety issues. was seen in the ED last friday. [facility] on Sunday.    History of Present Illness: Micheal [name] is a 59 y.o. male with a history of HTN, DM and OSA.  He has had problems with the masks and so was not currently on C Pap.   Micheal [name] presents for  Evaluation of palpitations.   Micheal [name] has a history of palpitations. He was told they were possibly anxiety attacks, but does not feel extremely anxious at baseline. The palpitations will start without warning. He will feel a sharp pain, and then his heart starts to race. He does not know of anything that makes it better or worse. It will eventually resolve. He has shorter palpitations and skips as well. He has no other kind of chest pain such as exertional chest pain. The chest pain lasts only a few seconds. He never has chest pain except when he is getting the palpitations.   He is getting the palpitations at least every day. He has never had a stress test or an echocardiogram.   When he was in the emergency room, he does not believe he had any palpitations while he was on their monitor. Currently he is resting comfortably. He has had some problems recently with just not feeling very well and has noted his blood pressure to be higher than normal. His renal function is followed by his kidney doctors.   Past Medical History  Diagnosis Date  . Diabetes mellitus   . OSA (obstructive sleep apnea)     severe with AHI 72/hr  . Obesity (BMI 30-39.9)   . Chronic kidney disease     he is s/p AVF but not on HD yet  . Hypertension     Past Surgical History  Procedure Laterality Date  . Av fistula placement      Current Outpatient Prescriptions  Medication Sig Dispense  Refill  . ALPRAZolam (XANAX) 0.25 MG tablet TK 1 T PO BID PRF PANIC ATTACK  0  . amLODipine (NORVASC) 10 MG tablet Take 10 mg by mouth daily.    . calcium acetate (PHOSLO) 667 MG capsule Take by mouth 3 (three) times daily with meals.    . carvedilol (COREG) 25 MG tablet Take 25 mg by mouth 2 (two) times daily.      . fluticasone (FLONASE) 50 MCG/ACT nasal spray Place 2 sprays into both nostrils daily.  5  . furosemide (LASIX) 20 MG tablet Take 40 mg by mouth daily.     [name] Kitchen glipiZIDE (GLUCOTROL) 10 MG tablet Take 20 mg by mouth 2 (two) times daily.      . hydrALAZINE (APRESOLINE) 50 MG tablet Take 1.5 tablets (75 mg total) by mouth 3 (three) times daily. 405 tablet 3  . insulin NPH (HUMULIN N,NOVOLIN N) 100 UNIT/ML injection Inject 52 Units into the skin 2 (two) times daily.      [name] Kitchen lisinopril (PRINIVIL,ZESTRIL) 40 MG tablet Take 40 mg by mouth daily.      . mometasone (NASONEX) 50 MCG/ACT nasal spray 1 spray each nostril daily 17 g 5  . omega-3 acid ethyl esters (LOVAZA) 1 G capsule Take 1 g by mouth daily.      [name] Kitchen  omeprazole (PRILOSEC) 20 MG capsule Take 20 mg by mouth daily.      [name] Kitchen SALINE NASAL SPRAY NA Place 1 spray into the nose 2 (two) times daily. 1 spray in each nostril    . sertraline (ZOLOFT) 50 MG tablet Take 0.5 tablets by mouth daily. For 1st week, then 1 tablet daily  0  . simvastatin (ZOCOR) 20 MG tablet Take 20 mg by mouth daily.       No current facility-administered medications for this visit.    Allergies:   Review of patient's allergies indicates no known allergies.    Social History:  The patient  reports that he has never smoked. He does not have any smokeless tobacco history on file. He reports that he does not drink alcohol or use illicit drugs.   Family History:  The patient's family history includes Heart disease in his father; Heart failure in his father and other.    ROS:  Please see the history of present illness. All other systems are reviewed and negative.     PHYSICAL EXAM: VS:  BP 166/68 mmHg  Pulse 63  Ht 5\' 9"  (1.753 m)  Wt 271 lb 3.2 oz (123.016 kg)  BMI 40.03 kg/m2 , BMI Body mass index is 40.03 kg/(m^2). GEN: Well nourished, well developed, in no acute distress HEENT: normal Neck: no JVD,  Murmur radiates to the left carotid,  no masses Cardiac: RRR;  2/6 systolic murmur at the left lower sternal border, no rubs, or gallops, trace lower extremity edema  Respiratory:  clear to auscultation bilaterally, normal work of breathing GI: soft, nontender, nondistended, + BS MS: no deformity or atrophy Skin: warm and dry, no rash Neuro:  Strength and sensation are intact Psych: euthymic mood, full affect   EKG:  EKG is not ordered today. The ekg ordered 05/25/2015 demonstrates sinus rhythm, rightward axis, possible left atrial enlargement   Recent Labs: 05/25/2015: ALT 20; BUN 70*; Creatinine, Ser 5.78*; Hemoglobin 9.2*; Platelets 241; Potassium 4.7; Sodium 137; TSH 1.374    Lipid Panel No results found for: CHOL, TRIG, HDL, CHOLHDL, VLDL, LDLCALC, LDLDIRECT   Wt Readings from Last 3 Encounters:  05/30/15 271 lb 3.2 oz (123.016 kg)  05/25/15 276 lb (125.193 kg)  02/16/15 260 lb (117.935 kg)     Other studies Reviewed: Additional studies/ records that were reviewed today include:  ER records, see prior records , previous ECG.  ASSESSMENT AND PLAN:  1.   Palpitations: they occur frequently. We will order an event monitor for 2 weeks. He will follow-up with Dr. Oval Linsey after this.   2. OSA : we will order titration to trying get the company to work with him and get a mask and appropriate titration of CPAP. With the nasal prongs, he doesn't feel like he is getting enough air and the other masks do not fit him well. Explained to him how his heart would benefit from controlling the OSA   3. Hypertension: his blood pressures not been well controlled recently. We will increase the hydralazine from 50 mg 3 times a day to 75 mg 3 times  a day. He is on maximum dose Coreg 25 mg twice a day and lisinopril 40 mg daily.    4. Chronic kidney disease stage IV-5 : His Nephrologist follows him closely but he is not yet on dialysis. BUN and creatinine on 09/02 were 70/5.78   5. Heart murmur: his murmur is radiating up into his carotids. He has never had an echocardiogram.  We will check this.   Current medicines are reviewed at length with the patient today.  The patient does not have concerns regarding medicines.  The following changes have been made:   Increase hydralazine  Labs/ tests ordered today include:   Orders Placed This Encounter  Procedures  . Cardiac event monitor  . ECHOCARDIOGRAM COMPLETE     Disposition:   FU with  Dr. Oval Linsey after the event monitor.   Augusto Garbe  05/30/2015 1:19 PM    [facility] [place], [place], [place]  [address] Phone: [phone]; Fax: [phone]

## 2015-05-30 NOTE — Patient Instructions (Signed)
Medication Instructions:  INCREASE Hydralazine to 75 mg - take 1.5 tablets (75 mg total) by mouth THREE times daily.  Labwork: NONE  Testing/Procedures: Your physician has requested that you have an echocardiogram. Echocardiography is a painless test that uses sound waves to create images of your heart. It provides your doctor with information about the size and shape of your heart and how well your heart's chambers and valves are working. This procedure takes approximately one hour. There are no restrictions for this procedure.  Your physician has recommended that you wear an event monitor. Event monitors are medical devices that record the heart's electrical activity. Doctors most often [place] these monitors to diagnose arrhythmias. Arrhythmias are problems with the speed or rhythm of the heartbeat. The monitor is a small, portable device. You can wear one while you do your normal daily activities. This is usually used to diagnose what is causing palpitations/syncope (passing out).  Your physician has requested that you have a CPAP titration. This will be scheduled at [facility]. We will call you after the sleep study to set you up to see Dr Claiborne Billings.  Follow-Up: Rosaria Ferries, PA-C, recommends that you schedule a follow-up appointment in 3-4 weeks with Dr Oval Linsey.  Any Other Special Instructions Will Be Listed Below (If Applicable).  Your Doctor has ordered you to wear a heart monitor. You will wear this for 14 days.   TIPS -  REMINDERS 1. The sensor is the lanyard that is worn around your neck every day - this is powered by a battery that needs to be changed every day 2. The monitor is the device that allows you to record symptoms - this will need to be charged daily 3. The sensor & monitor need to be within 100 feet of each other at all times 4. The sensor connects to the electrodes (stickers) - these should be changed every 24-48 hours (you do not have to remove them when you  bathe, just make sure they are dry when you connect it back to the sensor 5. If you need more supplies (electrodes, batteries), please call the 1-800 # on the back of the pamphlet and [employer] will mail you more supplies 6. If your skin becomes sensitive, please try the sample pack of sensitive skin electrodes (the white packet in your silver box) and call [employer] to have them mail you more of these type of electrodes 7. When you are finish wearing the monitor, please place all supplies back in the silver box, place the silver box in the pre-packaged UPS bag and drop off at [employer] or call them so they can come pick it up   Cardiac Event Monitoring A cardiac event monitor is a small recording device used to help detect abnormal heart rhythms (arrhythmias). The monitor is used to record heart rhythm when noticeable symptoms such as the following occur:  Fast heartbeats (palpitations), such as heart racing or fluttering.  Dizziness.  Fainting or light-headedness.  Unexplained weakness. The monitor is wired to two electrodes placed on your chest. Electrodes are flat, sticky disks that attach to your skin. The monitor can be worn for up to 30 days. You will wear the monitor at all times, except when bathing.  HOW TO USE YOUR CARDIAC EVENT MONITOR A technician will prepare your chest for the electrode placement. The technician will show you how to place the electrodes, how to work the monitor, and how to replace the batteries. Take time to practice using the monitor  before you leave the office. Make sure you understand how to send the information from the monitor to your health care provider. This requires a telephone with a landline, not a cell phone. You need to:  Wear your monitor at all times, except when you are in water:  Do not get the monitor wet.  Take the monitor off when bathing. Do not swim or use a hot tub with it on.  Keep your skin clean. Do not put body lotion or moisturizer on  your chest.  Change the electrodes daily or any time they stop sticking to your skin. You might need to use tape to keep them on.  It is possible that your skin under the electrodes could become irritated. To keep this from happening, try to put the electrodes in slightly different places on your chest. However, they must remain in the area under your left breast and in the upper right section of your chest.  Make sure the monitor is safely clipped to your clothing or in a location close to your body that your health care provider recommends.  Press the button to record when you feel symptoms of heart trouble, such as dizziness, weakness, light-headedness, palpitations, thumping, shortness of breath, unexplained weakness, or a fluttering or racing heart. The monitor is always on and records what happened slightly before you pressed the button, so do not worry about being too late to get good information.  Keep a diary of your activities, such as walking, doing chores, and taking medicine. It is especially important to note what you were doing when you pushed the button to record your symptoms. This will help your health care provider determine what might be contributing to your symptoms. The information stored in your monitor will be reviewed by your health care provider alongside your diary entries.  Send the recorded information as recommended by your health care provider. It is important to understand that it will take some time for your health care provider to process the results.  Change the batteries as recommended by your health care provider. SEEK IMMEDIATE MEDICAL CARE IF:   You have chest pain.  You have extreme difficulty breathing or shortness of breath.  You develop a very fast heartbeat that persists.  You develop dizziness that does not go away.  You faint or constantly feel you are about to faint. Document Released: 06/17/2008 Document Revised: 01/23/2014 Document Reviewed:  03/07/2013 [facility] Patient Information 2015 [place], [place]. This information is not intended to replace advice given to you by your health care provider. Make sure you discuss any questions you have with your health care provider.

## 2015-05-30 NOTE — Telephone Encounter (Signed)
Pt's wife called in stating that the pt was in to have a monitor placed but it was not activated. She states that the pt has had a couple of episodes since the  monitor has been placed but they did not get recorded due to the monitor not being activated. Please call him back to let him know that this has been taken care of.   Thanks

## 2015-05-30 NOTE — Telephone Encounter (Signed)
Instructed patient to cut it off for 10 minutes and then turn it back on.  There was some initial delay with getting monitor registered  Patient understood

## 2015-05-31 NOTE — Telephone Encounter (Signed)
Can this encounter be closed? Was he able to get his monitor working?

## 2015-06-06 ENCOUNTER — Other Ambulatory Visit: Payer: Self-pay

## 2015-06-06 ENCOUNTER — Ambulatory Visit (HOSPITAL_COMMUNITY): Payer: 59 | Attending: Cardiology

## 2015-06-06 DIAGNOSIS — I517 Cardiomegaly: Secondary | ICD-10-CM | POA: Insufficient documentation

## 2015-06-06 DIAGNOSIS — E119 Type 2 diabetes mellitus without complications: Secondary | ICD-10-CM | POA: Diagnosis not present

## 2015-06-06 DIAGNOSIS — I5189 Other ill-defined heart diseases: Secondary | ICD-10-CM | POA: Diagnosis not present

## 2015-06-06 DIAGNOSIS — G4733 Obstructive sleep apnea (adult) (pediatric): Secondary | ICD-10-CM | POA: Insufficient documentation

## 2015-06-06 DIAGNOSIS — I313 Pericardial effusion (noninflammatory): Secondary | ICD-10-CM | POA: Diagnosis not present

## 2015-06-06 DIAGNOSIS — Z6841 Body Mass Index (BMI) 40.0 and over, adult: Secondary | ICD-10-CM | POA: Diagnosis not present

## 2015-06-06 DIAGNOSIS — I129 Hypertensive chronic kidney disease with stage 1 through stage 4 chronic kidney disease, or unspecified chronic kidney disease: Secondary | ICD-10-CM | POA: Insufficient documentation

## 2015-06-06 DIAGNOSIS — E669 Obesity, unspecified: Secondary | ICD-10-CM | POA: Insufficient documentation

## 2015-06-06 DIAGNOSIS — R011 Cardiac murmur, unspecified: Secondary | ICD-10-CM | POA: Diagnosis not present

## 2015-06-06 DIAGNOSIS — N189 Chronic kidney disease, unspecified: Secondary | ICD-10-CM | POA: Diagnosis not present

## 2015-06-27 ENCOUNTER — Encounter: Payer: Self-pay | Admitting: Cardiovascular Disease

## 2015-06-27 ENCOUNTER — Ambulatory Visit (INDEPENDENT_AMBULATORY_CARE_PROVIDER_SITE_OTHER): Payer: 59 | Admitting: Cardiovascular Disease

## 2015-06-27 VITALS — BP 154/64 | HR 63 | Wt 258.0 lb

## 2015-06-27 DIAGNOSIS — R5383 Other fatigue: Secondary | ICD-10-CM

## 2015-06-27 DIAGNOSIS — I10 Essential (primary) hypertension: Secondary | ICD-10-CM | POA: Diagnosis not present

## 2015-06-27 DIAGNOSIS — I493 Ventricular premature depolarization: Secondary | ICD-10-CM

## 2015-06-27 MED ORDER — ISOSORBIDE DINITRATE 30 MG PO TABS: 30.0000 mg | ORAL_TABLET | Freq: Three times a day (TID) | ORAL | Status: DC

## 2015-06-27 MED ORDER — METOPROLOL SUCCINATE ER 200 MG PO TB24: 200.0000 mg | ORAL_TABLET | Freq: Every day | ORAL | Status: DC

## 2015-06-27 NOTE — Patient Instructions (Signed)
Your physician has requested that you have en exercise stress myoview. For further information please visit [email or web address]. Please follow instruction sheet, as given.  Stop coreg  Start metoprolol succ 200 mg one tablet daily  Isosorbide dinitrate 30 mg three times a day  Your physician recommends that you schedule a follow-up appointment in  1 months kristin   Your physician recommends that you schedule a follow-up appointment in 6 months with Rosaria Ferries PA

## 2015-06-27 NOTE — Progress Notes (Signed)
Cardiology Office Note   Date:  06/27/2015   ID:  Micheal [name], DOB Oct 28, 1955, MRN [id number]  PCP:  Lynne Logan, MD  Cardiologist:   Sharol Harness, MD   Chief Complaint  Patient presents with  . New Evaluation      History of Present Illness: Micheal [name] is a 59 y.o. 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  He was seen by Rosaria Ferries on 05/30/15 and sent for a 48 hour Holter monitor.  He was also referred for titration of his CPAP, as he was having difficulty wearing it.  His antihypertensives were titrated up due to poor BP control.  His Holter showed sinus rhythm with occasional PVCs that correlated to his report of his heart skipping beats.  Since he was last seen he continues to complain of skipped heart beats.  It occurs daily and is bothersome to him.  He denies chest pain, shortness of breath, lightheadedness, dizziness, LE edema, orthopnea or PND.    Micheal [name] does not get much exercise.  His most strenuous activity is walking up the stairs.  He denies chest pain, lightheadedness, palpitations or shortness of breath with this activity.   Micheal [name] h/h was noted to be low.  He denies melena or hematochezia.  Past Medical History  Diagnosis Date  . Diabetes mellitus   . OSA (obstructive sleep apnea)     severe with AHI 72/hr  . Obesity (BMI 30-39.9)   . Chronic kidney disease     he is s/p AVF but not on HD yet  . Hypertension     Past Surgical History  Procedure Laterality Date  . Av fistula placement       Current Outpatient Prescriptions  Medication Sig Dispense Refill  . ALPRAZolam (XANAX) 0.25 MG tablet Take 1 tablet as needed 3 times a day for panic attacks  0  . amLODipine (NORVASC) 10 MG tablet Take 10 mg by mouth daily.    . calcium acetate (PHOSLO) 667 MG capsule Take by mouth 3 (three) times daily with meals.    . fluticasone (FLONASE) 50 MCG/ACT nasal spray Place 2 sprays into  both nostrils daily.  5  . furosemide (LASIX) 20 MG tablet Take 40 mg by mouth daily.     [name] Kitchen glipiZIDE (GLUCOTROL) 10 MG tablet Take 20 mg by mouth 2 (two) times daily.      . hydrALAZINE (APRESOLINE) 50 MG tablet Take 1.5 tablets (75 mg total) by mouth 3 (three) times daily. 405 tablet 3  . insulin NPH (HUMULIN N,NOVOLIN N) 100 UNIT/ML injection Inject 52 Units into the skin 2 (two) times daily.      [name] Kitchen lisinopril (PRINIVIL,ZESTRIL) 40 MG tablet Take 40 mg by mouth daily.      [name] Kitchen omega-3 acid ethyl esters (LOVAZA) 1 G capsule Take 1 g by mouth daily.      [name] Kitchen omeprazole (PRILOSEC) 20 MG capsule Take 20 mg by mouth daily.      . sertraline (ZOLOFT) 50 MG tablet Take 0.5 tablets by mouth daily.   0  . simvastatin (ZOCOR) 20 MG tablet Take 20 mg by mouth daily.      . isosorbide dinitrate (ISORDIL) 30 MG tablet Take 1 tablet (30 mg total) by mouth 3 (three) times daily. 90 tablet 11  . metoprolol (TOPROL XL) 200 MG 24 hr tablet Take 1 tablet (200 mg total) by mouth daily. 30 tablet 11  No current facility-administered medications for this visit.    Allergies:   Review of patient's allergies indicates no known allergies.    Social History:  The patient  reports that he has never smoked. He does not have any smokeless tobacco history on file. He reports that he does not drink alcohol or use illicit drugs.   Family History:  The patient's family history includes Heart disease in his father; Heart failure in his father and other.    ROS:  Please see the history of present illness.   Otherwise, review of systems are positive for none.   All other systems are reviewed and negative.    PHYSICAL EXAM: VS:  BP 154/64 mmHg  Pulse 63  Wt 117.028 kg (258 lb) , BMI Body mass index is 38.08 kg/(m^2). GENERAL:  Well appearing HEENT:  Pupils equal round and reactive, fundi not visualized, oral mucosa unremarkable NECK:  No jugular venous distention, waveform within normal limits, carotid upstroke brisk  and symmetric, no bruits, no thyromegaly LYMPHATICS:  No cervical adenopathy LUNGS:  Clear to auscultation bilaterally HEART:  RRR.  PMI not displaced or sustained,S1 and S2 within normal limits, no S3, no S4, no clicks, no rubs, II/VI systolic murmur at the RUSB. ABD:  Flat, positive bowel sounds normal in frequency in pitch, no bruits, no rebound, no guarding, no midline pulsatile mass, no hepatomegaly, no splenomegaly EXT:  2 plus pulses throughout.  Thrill over L radial pulse.  No edema, no cyanosis no clubbing SKIN:  No rashes no nodules NEURO:  Cranial nerves II through XII grossly intact, motor grossly intact throughout PSYCH:  Cognitively intact, oriented to person place and time    EKG:  EKG is ordered today. The ekg ordered today demonstrates sinus rhythm at 63 bpm.    48 hour Holter 05/31/15: Quality: Fair. Baseline artifact. Predominant rhythm: sinus rhythm Pauses >2.5 seconds: 0 Occasional monomorphic PVCs. Patient did submit a symptom diary. He reported feeling skipped beats 7 time. Each time there was sinus rhythm with a PVC.  Echo 06/06/15: Study Conclusions  - Left ventricle: The cavity size was normal. Wall thickness was normal. Systolic function was normal. The estimated ejection fraction was in the range of 60% to 65%. Wall motion was normal; there were no regional wall motion abnormalities. Features are consistent with a pseudonormal left ventricular filling pattern, with concomitant abnormal relaxation and increased filling pressure (grade 2 diastolic dysfunction). - Aortic valve: There was no stenosis. - Mitral valve: There was no significant regurgitation. - Left atrium: The atrium was mildly dilated. - Right ventricle: The cavity size was normal. Systolic function was normal. - Right atrium: The atrium was mildly dilated. - Pulmonary arteries: No complete TR doppler jet so unable to estimate PA systolic pressure. - Inferior vena cava:  The vessel was normal in size. The respirophasic diameter changes were in the normal range (>= 50%), consistent with normal central venous pressure. - Pericardium, extracardiac: A trivial pericardial effusion was identified posterior to the heart.  Impressions:  - Normal LV size with EF 60-65%. Moderate diastolic dysfunction. Normal RV size and systolic function. No significant valvular abnormalities.   Recent Labs: 05/25/2015: ALT 20; BUN 70*; Creatinine, Ser 5.78*; Hemoglobin 9.2*; Platelets 241; Potassium 4.7; Sodium 137; TSH 1.374    Lipid Panel No results found for: CHOL, TRIG, HDL, CHOLHDL, VLDL, LDLCALC, LDLDIRECT    Wt Readings from Last 3 Encounters:  06/27/15 117.028 kg (258 lb)  05/30/15 123.016 kg (271 lb 3.2 oz)  05/25/15 125.193 kg (276 lb)      Other studies Reviewed: Additional studies/ records that were reviewed today include: . Review of the above records demonstrates:  Please see elsewhere in the note.     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  His BP is poorly-controlled, so we discussed my hesitation with switching coreg to metoprolol.  However he wants to try something to stop the palpitations.  Therefore we have recommended stopping carvedilol and starting metoprolol succinate 200 mg daily. He does not qualify for antiarrhythmic therapy based on this low number of PVCs and no associated cardiomyopathy. Will, however, assess for ischemia with a treadmill stress test. This will also show if he has more PVCs with exertion.  # Hypertension: BP poorly-controlled as above.  Stopping carvedilol and starting metoprolol in an effort to reduce his PVC burden. He is to continue amlodipine, hydralazine, and lisinopril at current doses. If his blood pressure continues to be poorly controlled his hydralazine can be titrated. We are also adding isosorbide dinitrate 20 mg 3 times a day.  We will have him follow  up with our pharmacist, Tommy Medal, in 1 month for titration of his antihypertensives.  # Chronic diastolic heart failure:   Blood pressure is poorly controlled as above. We are adding isosorbide dinitrate, which may help with afterload reduction and his grade 2 diastolic dysfunction. He is to continue on his other antihypertensives as above. He appears she will limit on exam.   Current medicines are reviewed at length with the patient today.  The patient does not have concerns regarding medicines.  The following changes have been made:  Stop carvedilol and start metoprolol 200 mg daily.  Start isosorbide dinitrate 20mg  tid.  Labs/ tests ordered today include:   Orders Placed This Encounter  Procedures  . Myocardial Perfusion Imaging  . EKG 12-Lead     Disposition:   FU with Tommy Medal in 1 month and Rhonda Barrett in 6 months.   Signed, Sharol Harness, MD  06/27/2015 12:17 PM    [facility]

## 2015-07-05 ENCOUNTER — Inpatient Hospital Stay (HOSPITAL_COMMUNITY): Admission: RE | Admit: 2015-07-05 | Payer: 59 | Source: Ambulatory Visit

## 2015-08-02 ENCOUNTER — Encounter: Payer: Self-pay | Admitting: Pharmacist Clinician (PhC)/ Clinical Pharmacy Specialist

## 2015-08-02 ENCOUNTER — Ambulatory Visit (INDEPENDENT_AMBULATORY_CARE_PROVIDER_SITE_OTHER): Payer: 59 | Admitting: Pharmacist Clinician (PhC)/ Clinical Pharmacy Specialist

## 2015-08-02 ENCOUNTER — Other Ambulatory Visit: Payer: Self-pay | Admitting: Cardiovascular Disease

## 2015-08-02 VITALS — BP 162/74 | HR 72 | Ht 69.0 in | Wt 264.0 lb

## 2015-08-02 DIAGNOSIS — I10 Essential (primary) hypertension: Secondary | ICD-10-CM | POA: Diagnosis not present

## 2015-08-02 LAB — BASIC METABOLIC PANEL
BUN: 55 mg/dL — ABNORMAL HIGH (ref 7–25)
CALCIUM: 8.4 mg/dL — AB (ref 8.6–10.3)
CO2: 23 mmol/L (ref 20–31)
CREATININE: 5.38 mg/dL — AB (ref 0.70–1.33)
Chloride: 103 mmol/L (ref 98–110)
Glucose, Bld: 185 mg/dL — ABNORMAL HIGH (ref 65–99)
Potassium: 4.8 mmol/L (ref 3.5–5.3)
SODIUM: 135 mmol/L (ref 135–146)

## 2015-08-02 NOTE — Assessment & Plan Note (Signed)
His BP remains elevated today at 162/74.  All readings are on his right arm as he has a fistula in place for dialysis on the left.  It was placed over a year ago, but so far has managed to avoid dialysis.  He has only been taking the hydralazine twice daily, stating it's hard to remember that middle day dose.  However, he has been doing the isosorbide three times daily without problem.  I have asked him to start taking the mid-day hydralazine at the same time as his isosorbide.  We also had a long discussion about doing some exercise.  I encouraged him to get on his treadmill or bike daily for at least 10-15 minutes.  Suggested that he watch TV while exercising as a distraction.  Pointed out that since he is not working, he can easily find a few minutes for exercise.  Also suggested he not eat out quite so much, instead look for easy to prepare foods that he can make at home, which would be lower in sodium than eating out.  I will see him again in 1 month for follow up.

## 2015-08-02 NOTE — Patient Instructions (Signed)
Return for a a follow up appointment on Dec 15  Your blood pressure today is 162/74  (goal is <140/90)  Check your blood pressure at home daily and keep record of the readings.  Take your BP meds as follows: increase hydralazine to 75 mg (1.5 tabs) three times daily.  All other medications will remain the same  Bring all of your meds, your BP cuff and your record of home blood pressures to your next appointment.  Exercise as you're able, try to walk approximately 30 minutes per day.  Keep salt intake to a minimum, especially watch canned and prepared boxed foods.  Eat more fresh fruits and vegetables and fewer canned items.  Avoid eating in fast food restaurants.    HOW TO TAKE YOUR BLOOD PRESSURE: . Rest 5 minutes before taking your blood pressure. .  Don't smoke or drink caffeinated beverages for at least 30 minutes before. . Take your blood pressure before (not after) you eat. . Sit comfortably with your back supported and both feet on the floor (don't cross your legs). . Elevate your arm to heart level on a table or a desk. . Use the proper sized cuff. It should fit smoothly and snugly around your bare upper arm. There should be enough room to slip a fingertip under the cuff. The bottom edge of the cuff should be 1 inch above the crease of the elbow. . Ideally, take 3 measurements at one sitting and record the average.

## 2015-08-02 NOTE — Progress Notes (Signed)
     08/02/2015 Micheal [name] 06-24-1956 [id number]   HPI:  Micheal [name] is a 59 y.o. male patient of Dr Oval Linsey, with a [place] below who presents today for hypertension clinic evaluation.   He has had poor BP control for some time and is now trying to get better control.  He is also currently seeing a nephrologist at the [facility] Hx: OSA, CKD stage 4-5, obesity, palpitations  Family Hx: mother with hypertension now in her 39's, father died in his 62's from an MI  Social Hx: no tobacco or alcohol, drinks occasional caffeine  Diet: eats out 75% of his meals, but at home adds no salt  Exercise: has a treadmill and stationary bike, has not been using either  Home BP readings: has cuff, but has not used recently  Current antihypertensive medications: amlodipine 10 mg qd, hydralazine 75 mg bid, lisinopril 40 mg qd, metoprolol succ 200 mg qd, isosorbide dinitrate 20 mg tid   Current Outpatient Prescriptions  Medication Sig Dispense Refill  . ALPRAZolam (XANAX) 0.25 MG tablet Take 1 tablet as needed 3 times a day for panic attacks  0  . amLODipine (NORVASC) 10 MG tablet Take 10 mg by mouth daily.    . calcium acetate (PHOSLO) 667 MG capsule Take by mouth 3 (three) times daily with meals.    . fluticasone (FLONASE) 50 MCG/ACT nasal spray Place 2 sprays into both nostrils daily.  5  . furosemide (LASIX) 20 MG tablet Take 40 mg by mouth 2 (two) times daily. 40 mg qam, 60 mg midday.    [name] Kitchen glipiZIDE (GLUCOTROL) 10 MG tablet Take 20 mg by mouth 2 (two) times daily.      . hydrALAZINE (APRESOLINE) 50 MG tablet Take 1.5 tablets (75 mg total) by mouth 3 (three) times daily. 405 tablet 3  . insulin NPH (HUMULIN N,NOVOLIN N) 100 UNIT/ML injection Inject 52 Units into the skin 2 (two) times daily.      . isosorbide dinitrate (ISORDIL) 30 MG tablet Take 1 tablet (30 mg total) by mouth 3 (three) times daily. 90 tablet 11  . lisinopril (PRINIVIL,ZESTRIL) 40 MG tablet Take 40 mg by mouth daily.        . metoprolol (TOPROL XL) 200 MG 24 hr tablet Take 1 tablet (200 mg total) by mouth daily. 30 tablet 11  . omega-3 acid ethyl esters (LOVAZA) 1 G capsule Take 1 g by mouth daily.      [name] Kitchen omeprazole (PRILOSEC) 20 MG capsule Take 20 mg by mouth daily.      . sertraline (ZOLOFT) 50 MG tablet Take 0.5 tablets by mouth daily.   0  . simvastatin (ZOCOR) 20 MG tablet Take 20 mg by mouth daily.       No current facility-administered medications for this visit.    No Known Allergies  Past Medical History  Diagnosis Date  . Diabetes mellitus   . OSA (obstructive sleep apnea)     severe with AHI 72/hr  . Obesity (BMI 30-39.9)   . Chronic kidney disease     he is s/p AVF but not on HD yet  . Hypertension     Blood pressure 162/74, pulse 72, height 5\' 9"  (1.753 m), weight 264 lb (119.75 kg).    Tommy Medal PharmD CPP [facility]

## 2015-08-09 ENCOUNTER — Telehealth: Payer: Self-pay | Admitting: *Deleted

## 2015-08-09 NOTE — Telephone Encounter (Signed)
-----   Message from Skeet Latch, MD sent at 08/08/2015  8:45 AM EST ----- Kidney function is still very high.  It will be very important to keep blood pressure and glucose under control.  His glucose is still high.  Please schedule follow up with your PCP and make sure you are seen by a kidney doctor.

## 2015-08-09 NOTE — Telephone Encounter (Signed)
LEFT MESSAGE TO CALL BACK AS TO SPEAK TO TRIAIGE 08/09/15

## 2015-08-14 NOTE — Telephone Encounter (Signed)
Left message to call back  

## 2015-08-15 NOTE — Telephone Encounter (Signed)
Lab results given to patient and wife.  Voiced understanding and will make appts with his PCP and kidney doctor.

## 2015-08-24 ENCOUNTER — Telehealth: Payer: Self-pay | Admitting: *Deleted

## 2015-08-24 NOTE — Telephone Encounter (Signed)
Disregard phone call Patient received information on 08/15/15

## 2015-08-24 NOTE — Telephone Encounter (Signed)
-----   Message from Skeet Latch, MD sent at 08/08/2015  8:45 AM EST ----- Kidney function is still very high.  It will be very important to keep blood pressure and glucose under control.  His glucose is still high.  Please schedule follow up with your PCP and make sure you are seen by a kidney doctor.

## 2015-08-24 NOTE — Telephone Encounter (Signed)
Left message to call back  

## 2015-09-06 ENCOUNTER — Ambulatory Visit: Payer: 59 | Admitting: Pharmacist Clinician (PhC)/ Clinical Pharmacy Specialist

## 2015-09-12 ENCOUNTER — Ambulatory Visit: Payer: 59 | Admitting: Pharmacist Clinician (PhC)/ Clinical Pharmacy Specialist

## 2015-09-18 ENCOUNTER — Ambulatory Visit: Payer: 59 | Admitting: Pharmacist Clinician (PhC)/ Clinical Pharmacy Specialist

## 2016-04-11 ENCOUNTER — Inpatient Hospital Stay (HOSPITAL_BASED_OUTPATIENT_CLINIC_OR_DEPARTMENT_OTHER)
Admission: EM | Admit: 2016-04-11 | Discharge: 2016-04-21 | DRG: 291 | Disposition: A | Discharge status: Home / Self Care | Payer: Non-veteran care | Attending: Internal Medicine | Admitting: Internal Medicine

## 2016-04-11 ENCOUNTER — Encounter (HOSPITAL_BASED_OUTPATIENT_CLINIC_OR_DEPARTMENT_OTHER): Payer: Self-pay | Admitting: Emergency Medicine

## 2016-04-11 DIAGNOSIS — E875 Hyperkalemia: Secondary | ICD-10-CM | POA: Diagnosis present

## 2016-04-11 DIAGNOSIS — E872 Acidosis, unspecified: Secondary | ICD-10-CM | POA: Diagnosis present

## 2016-04-11 DIAGNOSIS — J9600 Acute respiratory failure, unspecified whether with hypoxia or hypercapnia: Secondary | ICD-10-CM | POA: Diagnosis present

## 2016-04-11 DIAGNOSIS — D631 Anemia in chronic kidney disease: Secondary | ICD-10-CM | POA: Diagnosis present

## 2016-04-11 DIAGNOSIS — Z6836 Body mass index (BMI) 36.0-36.9, adult: Secondary | ICD-10-CM

## 2016-04-11 DIAGNOSIS — D509 Iron deficiency anemia, unspecified: Secondary | ICD-10-CM | POA: Diagnosis present

## 2016-04-11 DIAGNOSIS — J810 Acute pulmonary edema: Secondary | ICD-10-CM | POA: Diagnosis present

## 2016-04-11 DIAGNOSIS — N179 Acute kidney failure, unspecified: Secondary | ICD-10-CM | POA: Diagnosis present

## 2016-04-11 DIAGNOSIS — I5033 Acute on chronic diastolic (congestive) heart failure: Secondary | ICD-10-CM | POA: Diagnosis present

## 2016-04-11 DIAGNOSIS — Z79899 Other long term (current) drug therapy: Secondary | ICD-10-CM

## 2016-04-11 DIAGNOSIS — D649 Anemia, unspecified: Secondary | ICD-10-CM | POA: Diagnosis present

## 2016-04-11 DIAGNOSIS — I132 Hypertensive heart and chronic kidney disease with heart failure and with stage 5 chronic kidney disease, or end stage renal disease: Principal | ICD-10-CM | POA: Diagnosis present

## 2016-04-11 DIAGNOSIS — E669 Obesity, unspecified: Secondary | ICD-10-CM | POA: Diagnosis present

## 2016-04-11 DIAGNOSIS — R0600 Dyspnea, unspecified: Secondary | ICD-10-CM

## 2016-04-11 DIAGNOSIS — Z992 Dependence on renal dialysis: Secondary | ICD-10-CM

## 2016-04-11 DIAGNOSIS — J189 Pneumonia, unspecified organism: Secondary | ICD-10-CM | POA: Diagnosis present

## 2016-04-11 DIAGNOSIS — Z794 Long term (current) use of insulin: Secondary | ICD-10-CM

## 2016-04-11 DIAGNOSIS — J9601 Acute respiratory failure with hypoxia: Secondary | ICD-10-CM | POA: Diagnosis present

## 2016-04-11 DIAGNOSIS — Z8249 Family history of ischemic heart disease and other diseases of the circulatory system: Secondary | ICD-10-CM

## 2016-04-11 DIAGNOSIS — I10 Essential (primary) hypertension: Secondary | ICD-10-CM | POA: Insufficient documentation

## 2016-04-11 DIAGNOSIS — I953 Hypotension of hemodialysis: Secondary | ICD-10-CM | POA: Diagnosis not present

## 2016-04-11 DIAGNOSIS — N189 Chronic kidney disease, unspecified: Secondary | ICD-10-CM | POA: Diagnosis present

## 2016-04-11 DIAGNOSIS — I5032 Chronic diastolic (congestive) heart failure: Secondary | ICD-10-CM | POA: Diagnosis present

## 2016-04-11 DIAGNOSIS — N186 End stage renal disease: Secondary | ICD-10-CM

## 2016-04-11 DIAGNOSIS — G4733 Obstructive sleep apnea (adult) (pediatric): Secondary | ICD-10-CM | POA: Diagnosis present

## 2016-04-11 DIAGNOSIS — E1122 Type 2 diabetes mellitus with diabetic chronic kidney disease: Secondary | ICD-10-CM | POA: Diagnosis present

## 2016-04-11 DIAGNOSIS — Z23 Encounter for immunization: Secondary | ICD-10-CM

## 2016-04-11 DIAGNOSIS — F329 Major depressive disorder, single episode, unspecified: Secondary | ICD-10-CM | POA: Diagnosis present

## 2016-04-11 DIAGNOSIS — Z9119 Patient's noncompliance with other medical treatment and regimen: Secondary | ICD-10-CM

## 2016-04-11 HISTORY — DX: Chronic diastolic (congestive) heart failure: I50.32

## 2016-04-11 HISTORY — DX: Pneumonia, unspecified organism: J18.9

## 2016-04-11 HISTORY — DX: Acute respiratory failure with hypoxia: J96.01

## 2016-04-11 HISTORY — DX: Anemia, unspecified: D64.9

## 2016-04-11 NOTE — ED Notes (Signed)
Patient reports that he has been SOB x just today. Patient called EMS and an EKG was done, with the patient on arrival. Patient with decreased O2 sats in triage. No history of Breathing issues in the past.

## 2016-04-12 ENCOUNTER — Emergency Department (HOSPITAL_BASED_OUTPATIENT_CLINIC_OR_DEPARTMENT_OTHER): Payer: Non-veteran care

## 2016-04-12 ENCOUNTER — Encounter (HOSPITAL_COMMUNITY): Payer: Self-pay | Admitting: *Deleted

## 2016-04-12 DIAGNOSIS — J189 Pneumonia, unspecified organism: Secondary | ICD-10-CM | POA: Diagnosis not present

## 2016-04-12 DIAGNOSIS — Z6836 Body mass index (BMI) 36.0-36.9, adult: Secondary | ICD-10-CM | POA: Diagnosis not present

## 2016-04-12 DIAGNOSIS — E875 Hyperkalemia: Secondary | ICD-10-CM | POA: Diagnosis present

## 2016-04-12 DIAGNOSIS — J9601 Acute respiratory failure with hypoxia: Secondary | ICD-10-CM | POA: Diagnosis present

## 2016-04-12 DIAGNOSIS — Z8249 Family history of ischemic heart disease and other diseases of the circulatory system: Secondary | ICD-10-CM | POA: Diagnosis not present

## 2016-04-12 DIAGNOSIS — I132 Hypertensive heart and chronic kidney disease with heart failure and with stage 5 chronic kidney disease, or end stage renal disease: Secondary | ICD-10-CM | POA: Diagnosis present

## 2016-04-12 DIAGNOSIS — D649 Anemia, unspecified: Secondary | ICD-10-CM | POA: Diagnosis present

## 2016-04-12 DIAGNOSIS — R0600 Dyspnea, unspecified: Secondary | ICD-10-CM | POA: Diagnosis present

## 2016-04-12 DIAGNOSIS — E1122 Type 2 diabetes mellitus with diabetic chronic kidney disease: Secondary | ICD-10-CM | POA: Diagnosis not present

## 2016-04-12 DIAGNOSIS — Z23 Encounter for immunization: Secondary | ICD-10-CM | POA: Diagnosis not present

## 2016-04-12 DIAGNOSIS — I5033 Acute on chronic diastolic (congestive) heart failure: Secondary | ICD-10-CM | POA: Diagnosis not present

## 2016-04-12 DIAGNOSIS — N179 Acute kidney failure, unspecified: Secondary | ICD-10-CM | POA: Diagnosis present

## 2016-04-12 DIAGNOSIS — J9600 Acute respiratory failure, unspecified whether with hypoxia or hypercapnia: Secondary | ICD-10-CM | POA: Diagnosis present

## 2016-04-12 DIAGNOSIS — E872 Acidosis, unspecified: Secondary | ICD-10-CM | POA: Diagnosis present

## 2016-04-12 DIAGNOSIS — F329 Major depressive disorder, single episode, unspecified: Secondary | ICD-10-CM | POA: Diagnosis not present

## 2016-04-12 DIAGNOSIS — J810 Acute pulmonary edema: Secondary | ICD-10-CM | POA: Diagnosis present

## 2016-04-12 DIAGNOSIS — D631 Anemia in chronic kidney disease: Secondary | ICD-10-CM | POA: Diagnosis not present

## 2016-04-12 DIAGNOSIS — Z794 Long term (current) use of insulin: Secondary | ICD-10-CM | POA: Diagnosis not present

## 2016-04-12 DIAGNOSIS — Z992 Dependence on renal dialysis: Secondary | ICD-10-CM | POA: Diagnosis not present

## 2016-04-12 DIAGNOSIS — D509 Iron deficiency anemia, unspecified: Secondary | ICD-10-CM | POA: Diagnosis not present

## 2016-04-12 DIAGNOSIS — N189 Chronic kidney disease, unspecified: Secondary | ICD-10-CM

## 2016-04-12 DIAGNOSIS — I5032 Chronic diastolic (congestive) heart failure: Secondary | ICD-10-CM | POA: Diagnosis present

## 2016-04-12 DIAGNOSIS — E669 Obesity, unspecified: Secondary | ICD-10-CM | POA: Diagnosis not present

## 2016-04-12 DIAGNOSIS — G4733 Obstructive sleep apnea (adult) (pediatric): Secondary | ICD-10-CM | POA: Diagnosis not present

## 2016-04-12 DIAGNOSIS — I509 Heart failure, unspecified: Secondary | ICD-10-CM | POA: Diagnosis not present

## 2016-04-12 DIAGNOSIS — N186 End stage renal disease: Secondary | ICD-10-CM | POA: Diagnosis not present

## 2016-04-12 DIAGNOSIS — Z79899 Other long term (current) drug therapy: Secondary | ICD-10-CM | POA: Diagnosis not present

## 2016-04-12 DIAGNOSIS — I953 Hypotension of hemodialysis: Secondary | ICD-10-CM | POA: Diagnosis not present

## 2016-04-12 DIAGNOSIS — I10 Essential (primary) hypertension: Secondary | ICD-10-CM | POA: Diagnosis not present

## 2016-04-12 DIAGNOSIS — Z9119 Patient's noncompliance with other medical treatment and regimen: Secondary | ICD-10-CM | POA: Diagnosis not present

## 2016-04-12 LAB — COMPREHENSIVE METABOLIC PANEL
ALBUMIN: 3.3 g/dL — AB (ref 3.5–5.0)
ALT: 13 U/L — ABNORMAL LOW (ref 17–63)
AST: 18 U/L (ref 15–41)
Alkaline Phosphatase: 70 U/L (ref 38–126)
Anion gap: 12 (ref 5–15)
BUN: 81 mg/dL — ABNORMAL HIGH (ref 6–20)
CALCIUM: 7.4 mg/dL — AB (ref 8.9–10.3)
CO2: 14 mmol/L — AB (ref 22–32)
CREATININE: 9.62 mg/dL — AB (ref 0.61–1.24)
Chloride: 110 mmol/L (ref 101–111)
GFR calc non Af Amer: 5 mL/min — ABNORMAL LOW (ref >60)
GFR, EST AFRICAN AMERICAN: 6 mL/min — AB (ref >60)
Glucose, Bld: 135 mg/dL — ABNORMAL HIGH (ref 65–99)
Potassium: 5.4 mmol/L — ABNORMAL HIGH (ref 3.5–5.1)
SODIUM: 136 mmol/L (ref 135–145)
Total Bilirubin: 0.5 mg/dL (ref 0.3–1.2)
Total Protein: 7.6 g/dL (ref 6.5–8.1)

## 2016-04-12 LAB — CBC WITH DIFFERENTIAL/PLATELET
BASOS PCT: 1 %
Basophils Absolute: 0.1 10*3/uL (ref 0.0–0.1)
EOS ABS: 0.2 10*3/uL (ref 0.0–0.7)
EOS PCT: 1 %
HCT: 25.2 % — ABNORMAL LOW (ref 39.0–52.0)
Hemoglobin: 8.1 g/dL — ABNORMAL LOW (ref 13.0–17.0)
LYMPHS ABS: 1.8 10*3/uL (ref 0.7–4.0)
Lymphocytes Relative: 15 %
MCH: 26.8 pg (ref 26.0–34.0)
MCHC: 32.1 g/dL (ref 30.0–36.0)
MCV: 83.4 fL (ref 78.0–100.0)
MONOS PCT: 6 %
Monocytes Absolute: 0.8 10*3/uL (ref 0.1–1.0)
Neutro Abs: 9.6 10*3/uL — ABNORMAL HIGH (ref 1.7–7.7)
Neutrophils Relative %: 77 %
PLATELETS: 281 10*3/uL (ref 150–400)
RBC: 3.02 MIL/uL — ABNORMAL LOW (ref 4.22–5.81)
RDW: 16.1 % — AB (ref 11.5–15.5)
WBC: 12.4 10*3/uL — ABNORMAL HIGH (ref 4.0–10.5)

## 2016-04-12 LAB — IRON AND TIBC
Iron: 18 ug/dL — ABNORMAL LOW (ref 45–182)
SATURATION RATIOS: 7 % — AB (ref 17.9–39.5)
TIBC: 242 ug/dL — AB (ref 250–450)
UIBC: 224 ug/dL

## 2016-04-12 LAB — RETICULOCYTES
RBC.: 3.15 MIL/uL — AB (ref 4.22–5.81)
RETIC CT PCT: 1.7 % (ref 0.4–3.1)
Retic Count, Absolute: 53.6 10*3/uL (ref 19.0–186.0)

## 2016-04-12 LAB — FERRITIN: FERRITIN: 160 ng/mL (ref 24–336)

## 2016-04-12 LAB — MRSA PCR SCREENING: MRSA BY PCR: POSITIVE — AB

## 2016-04-12 LAB — VITAMIN B12: Vitamin B-12: 304 pg/mL (ref 180–914)

## 2016-04-12 LAB — GLUCOSE, CAPILLARY
GLUCOSE-CAPILLARY: 156 mg/dL — AB (ref 65–99)
Glucose-Capillary: 187 mg/dL — ABNORMAL HIGH (ref 65–99)
Glucose-Capillary: 277 mg/dL — ABNORMAL HIGH (ref 65–99)
Glucose-Capillary: 181 mg/dL — ABNORMAL HIGH (ref 65–99)

## 2016-04-12 LAB — BRAIN NATRIURETIC PEPTIDE: B Natriuretic Peptide: 140.1 pg/mL — ABNORMAL HIGH (ref 0.0–100.0)

## 2016-04-12 LAB — FOLATE: Folate: 6.8 ng/mL (ref >5.9)

## 2016-04-12 MED ORDER — ENSURE ENLIVE PO LIQD
237.0000 mL | Freq: Two times a day (BID) | ORAL | Status: DC
  Administered 2016-04-12 (×2): 237 mL via ORAL

## 2016-04-12 MED ORDER — PNEUMOCOCCAL VAC POLYVALENT 25 MCG/0.5ML IJ INJ
0.5000 mL | INJECTION | Freq: Once | INTRAMUSCULAR | Status: AC
Start: 2016-04-12 — End: 2016-04-12
  Administered 2016-04-12: 0.5 mL via INTRAMUSCULAR
  Filled 2016-04-12: qty 0.5

## 2016-04-12 MED ORDER — BISACODYL 10 MG RE SUPP
10.0000 mg | Freq: Every day | RECTAL | Status: DC | PRN
Start: 2016-04-12 — End: 2016-04-21

## 2016-04-12 MED ORDER — FUROSEMIDE 10 MG/ML IJ SOLN
40.0000 mg | Freq: Once | INTRAMUSCULAR | Status: AC
  Administered 2016-04-12: 40 mg via INTRAVENOUS
  Filled 2016-04-12: qty 4

## 2016-04-12 MED ORDER — INSULIN ASPART 100 UNIT/ML ~~LOC~~ SOLN
0.0000 [IU] | Freq: Every day | SUBCUTANEOUS | Status: DC
  Administered 2016-04-13 – 2016-04-15 (×3): 3 [IU] via SUBCUTANEOUS
  Administered 2016-04-16 – 2016-04-18 (×3): 2 [IU] via SUBCUTANEOUS
  Administered 2016-04-20: 3 [IU] via SUBCUTANEOUS

## 2016-04-12 MED ORDER — ALPRAZOLAM 0.25 MG PO TABS
0.2500 mg | ORAL_TABLET | Freq: Three times a day (TID) | ORAL | Status: DC | PRN
  Filled 2016-04-12: qty 1

## 2016-04-12 MED ORDER — FLUTICASONE PROPIONATE 50 MCG/ACT NA SUSP
2.0000 | Freq: Every day | NASAL | Status: DC
  Administered 2016-04-12 – 2016-04-20 (×9): 2 via NASAL
  Filled 2016-04-12: qty 16

## 2016-04-12 MED ORDER — ALBUTEROL SULFATE (2.5 MG/3ML) 0.083% IN NEBU
2.5000 mg | INHALATION_SOLUTION | Freq: Four times a day (QID) | RESPIRATORY_TRACT | Status: DC
  Administered 2016-04-13 (×4): 2.5 mg via RESPIRATORY_TRACT
  Filled 2016-04-12 (×4): qty 3

## 2016-04-12 MED ORDER — ONDANSETRON HCL 4 MG/2ML IJ SOLN: 4.0000 mg | Freq: Four times a day (QID) | INTRAMUSCULAR | Status: DC | PRN

## 2016-04-12 MED ORDER — ALBUTEROL SULFATE (2.5 MG/3ML) 0.083% IN NEBU
2.5000 mg | INHALATION_SOLUTION | RESPIRATORY_TRACT | Status: DC
  Administered 2016-04-12 (×3): 2.5 mg via RESPIRATORY_TRACT
  Filled 2016-04-12 (×4): qty 3

## 2016-04-12 MED ORDER — SIMVASTATIN 20 MG PO TABS
20.0000 mg | ORAL_TABLET | Freq: Every day | ORAL | Status: DC
  Administered 2016-04-12 – 2016-04-21 (×10): 20 mg via ORAL
  Filled 2016-04-12 (×10): qty 1

## 2016-04-12 MED ORDER — HYDRALAZINE HCL 50 MG PO TABS
75.0000 mg | ORAL_TABLET | Freq: Three times a day (TID) | ORAL | Status: DC
  Administered 2016-04-12 – 2016-04-15 (×11): 75 mg via ORAL
  Filled 2016-04-12 (×12): qty 1

## 2016-04-12 MED ORDER — FUROSEMIDE 10 MG/ML IJ SOLN
160.0000 mg | Freq: Once | INTRAVENOUS | Status: AC
  Administered 2016-04-12: 160 mg via INTRAVENOUS
  Filled 2016-04-12: qty 16

## 2016-04-12 MED ORDER — DEXTROSE 5 % IV SOLN
500.0000 mg | INTRAVENOUS | Status: DC
  Administered 2016-04-12: 500 mg via INTRAVENOUS
  Filled 2016-04-12 (×2): qty 500

## 2016-04-12 MED ORDER — LISINOPRIL 40 MG PO TABS: 40.0000 mg | ORAL_TABLET | Freq: Every day | ORAL | Status: DC

## 2016-04-12 MED ORDER — ONDANSETRON HCL 4 MG PO TABS: 4.0000 mg | ORAL_TABLET | Freq: Four times a day (QID) | ORAL | Status: DC | PRN

## 2016-04-12 MED ORDER — ACETAMINOPHEN 325 MG PO TABS
650.0000 mg | ORAL_TABLET | Freq: Four times a day (QID) | ORAL | Status: DC | PRN
  Administered 2016-04-12 – 2016-04-15 (×4): 650 mg via ORAL
  Filled 2016-04-12 (×5): qty 2

## 2016-04-12 MED ORDER — LIDOCAINE-PRILOCAINE 2.5-2.5 % EX CREA: 1.0000 "application " | TOPICAL_CREAM | CUTANEOUS | Status: DC | PRN

## 2016-04-12 MED ORDER — NEPRO/CARBSTEADY PO LIQD: 237.0000 mL | Freq: Every day | ORAL | Status: DC | PRN

## 2016-04-12 MED ORDER — INSULIN ASPART 100 UNIT/ML ~~LOC~~ SOLN
0.0000 [IU] | Freq: Three times a day (TID) | SUBCUTANEOUS | Status: DC
Start: 2016-04-12 — End: 2016-04-21
  Administered 2016-04-12 (×2): 2 [IU] via SUBCUTANEOUS
  Administered 2016-04-12: 7 [IU] via SUBCUTANEOUS
  Administered 2016-04-13: 2 [IU] via SUBCUTANEOUS
  Administered 2016-04-13: 3 [IU] via SUBCUTANEOUS
  Administered 2016-04-13 – 2016-04-15 (×4): 2 [IU] via SUBCUTANEOUS
  Administered 2016-04-15 – 2016-04-16 (×2): 3 [IU] via SUBCUTANEOUS
  Administered 2016-04-16 – 2016-04-17 (×2): 2 [IU] via SUBCUTANEOUS
  Administered 2016-04-17: 3 [IU] via SUBCUTANEOUS
  Administered 2016-04-17 – 2016-04-18 (×3): 2 [IU] via SUBCUTANEOUS
  Administered 2016-04-19: 3 [IU] via SUBCUTANEOUS
  Administered 2016-04-19: 5 [IU] via SUBCUTANEOUS
  Administered 2016-04-19: 2 [IU] via SUBCUTANEOUS
  Administered 2016-04-20 (×3): 3 [IU] via SUBCUTANEOUS
  Administered 2016-04-21: 7 [IU] via SUBCUTANEOUS

## 2016-04-12 MED ORDER — HEPARIN SODIUM (PORCINE) 1000 UNIT/ML DIALYSIS: 20.0000 [IU]/kg | INTRAMUSCULAR | Status: DC | PRN

## 2016-04-12 MED ORDER — SODIUM CHLORIDE 0.9 % IV SOLN
510.0000 mg | Freq: Once | INTRAVENOUS | Status: AC
  Administered 2016-04-12: 510 mg via INTRAVENOUS
  Filled 2016-04-12: qty 17

## 2016-04-12 MED ORDER — CHLORHEXIDINE GLUCONATE CLOTH 2 % EX PADS
6.0000 | MEDICATED_PAD | Freq: Every day | CUTANEOUS | Status: AC
  Administered 2016-04-12 – 2016-04-16 (×5): 6 via TOPICAL

## 2016-04-12 MED ORDER — ISOSORBIDE DINITRATE 10 MG PO TABS
30.0000 mg | ORAL_TABLET | Freq: Three times a day (TID) | ORAL | Status: DC
  Administered 2016-04-12 – 2016-04-20 (×25): 30 mg via ORAL
  Filled 2016-04-12 (×26): qty 3

## 2016-04-12 MED ORDER — CETYLPYRIDINIUM CHLORIDE 0.05 % MT LIQD
7.0000 mL | Freq: Two times a day (BID) | OROMUCOSAL | Status: DC
  Administered 2016-04-12 – 2016-04-19 (×16): 7 mL via OROMUCOSAL

## 2016-04-12 MED ORDER — ALBUTEROL SULFATE (2.5 MG/3ML) 0.083% IN NEBU: 2.5000 mg | INHALATION_SOLUTION | RESPIRATORY_TRACT | Status: DC | PRN

## 2016-04-12 MED ORDER — SIMVASTATIN 20 MG PO TABS: 20.0000 mg | ORAL_TABLET | Freq: Every day | ORAL | Status: DC

## 2016-04-12 MED ORDER — SODIUM CHLORIDE 0.9 % IV SOLN: 100.0000 mL | INTRAVENOUS | Status: DC | PRN

## 2016-04-12 MED ORDER — ALTEPLASE 2 MG IJ SOLR: 2.0000 mg | Freq: Once | INTRAMUSCULAR | Status: DC | PRN

## 2016-04-12 MED ORDER — HEPARIN SODIUM (PORCINE) 1000 UNIT/ML DIALYSIS: 1000.0000 [IU] | INTRAMUSCULAR | Status: DC | PRN

## 2016-04-12 MED ORDER — MORPHINE SULFATE (PF) 2 MG/ML IV SOLN
1.0000 mg | INTRAVENOUS | Status: DC | PRN
  Administered 2016-04-15: 1 mg via INTRAVENOUS
  Filled 2016-04-12: qty 1

## 2016-04-12 MED ORDER — SERTRALINE HCL 50 MG PO TABS
25.0000 mg | ORAL_TABLET | Freq: Every day | ORAL | Status: DC
  Administered 2016-04-12 – 2016-04-21 (×10): 25 mg via ORAL
  Filled 2016-04-12 (×10): qty 1

## 2016-04-12 MED ORDER — DARBEPOETIN ALFA 100 MCG/0.5ML IJ SOSY
100.0000 ug | PREFILLED_SYRINGE | INTRAMUSCULAR | Status: DC
  Administered 2016-04-12: 100 ug via INTRAVENOUS

## 2016-04-12 MED ORDER — DARBEPOETIN ALFA 100 MCG/0.5ML IJ SOSY
PREFILLED_SYRINGE | INTRAMUSCULAR | Status: AC
  Administered 2016-04-12: 100 ug via INTRAVENOUS
  Filled 2016-04-12: qty 0.5

## 2016-04-12 MED ORDER — AMLODIPINE BESYLATE 10 MG PO TABS
10.0000 mg | ORAL_TABLET | Freq: Every day | ORAL | Status: DC
  Administered 2016-04-12 – 2016-04-13 (×2): 10 mg via ORAL
  Filled 2016-04-12 (×2): qty 1

## 2016-04-12 MED ORDER — PENTAFLUOROPROP-TETRAFLUOROETH EX AERO: 1.0000 "application " | INHALATION_SPRAY | CUTANEOUS | Status: DC | PRN

## 2016-04-12 MED ORDER — CEFTRIAXONE SODIUM 1 G IJ SOLR
1.0000 g | INTRAMUSCULAR | Status: AC
  Administered 2016-04-12 – 2016-04-16 (×5): 1 g via INTRAVENOUS
  Filled 2016-04-12 (×5): qty 10

## 2016-04-12 MED ORDER — DEXTROSE 5 % IV SOLN
1.0000 g | Freq: Once | INTRAVENOUS | Status: AC
  Administered 2016-04-12: 1 g via INTRAVENOUS
  Filled 2016-04-12: qty 10

## 2016-04-12 MED ORDER — PANTOPRAZOLE SODIUM 40 MG PO TBEC
40.0000 mg | DELAYED_RELEASE_TABLET | Freq: Every day | ORAL | Status: DC
  Administered 2016-04-12 – 2016-04-20 (×9): 40 mg via ORAL
  Filled 2016-04-12 (×9): qty 1

## 2016-04-12 MED ORDER — IPRATROPIUM-ALBUTEROL 0.5-2.5 (3) MG/3ML IN SOLN
3.0000 mL | RESPIRATORY_TRACT | Status: DC | PRN
  Administered 2016-04-12: 3 mL via RESPIRATORY_TRACT
  Filled 2016-04-12: qty 3

## 2016-04-12 MED ORDER — PHENOL 1.4 % MT LIQD
1.0000 | OROMUCOSAL | Status: DC | PRN
  Administered 2016-04-12: 1 via OROMUCOSAL
  Filled 2016-04-12: qty 177

## 2016-04-12 MED ORDER — METOPROLOL SUCCINATE ER 100 MG PO TB24
200.0000 mg | ORAL_TABLET | Freq: Every day | ORAL | Status: DC
  Administered 2016-04-12 – 2016-04-13 (×2): 200 mg via ORAL
  Filled 2016-04-12 (×2): qty 2

## 2016-04-12 MED ORDER — AZITHROMYCIN 500 MG IV SOLR
INTRAVENOUS | Status: AC
  Filled 2016-04-12: qty 500

## 2016-04-12 MED ORDER — DEXTROSE 5 % IV SOLN
500.0000 mg | Freq: Once | INTRAVENOUS | Status: AC
  Administered 2016-04-12: 500 mg via INTRAVENOUS

## 2016-04-12 MED ORDER — LIDOCAINE HCL (PF) 1 % IJ SOLN: 5.0000 mL | INTRAMUSCULAR | Status: DC | PRN

## 2016-04-12 MED ORDER — MUPIROCIN 2 % EX OINT
1.0000 "application " | TOPICAL_OINTMENT | Freq: Two times a day (BID) | CUTANEOUS | Status: AC
  Administered 2016-04-12 – 2016-04-16 (×10): 1 via NASAL
  Filled 2016-04-12 (×3): qty 22

## 2016-04-12 MED ORDER — HYDRALAZINE HCL 20 MG/ML IJ SOLN: 20.0000 mg | Freq: Four times a day (QID) | INTRAMUSCULAR | Status: DC | PRN

## 2016-04-12 MED ORDER — ACETAMINOPHEN 650 MG RE SUPP: 650.0000 mg | Freq: Four times a day (QID) | RECTAL | Status: DC | PRN

## 2016-04-12 NOTE — ED Notes (Signed)
Patient ambulatory to the restroom. Patient very dyspneic after ambulation back to room.  - 02 sats are 94 percent on room air. Patient supported and returned bed resting. Patient given ginger ale and crackers

## 2016-04-12 NOTE — Progress Notes (Signed)
New Admission Note:   Arrival Method: Via [facility] from [facility] Mental Orientation:  A & O x 4 Telemetry: Placed on Tele 6E25 Assessment: Completed Skin:  Dry but intact IV:  Rt forearm  Pain: Denies Tubes:  None Safety Measures: Safety Fall Prevention Plan has been given, discussed and signed Admission: Completed [address] [place] Orientation: Patient has been orientated to the room, unit and staff.  Family:  Wife at bedside  Orders have been reviewed and implemented. Will continue to monitor the patient. Call light has been placed within reach and bed alarm has been activated.   Earleen Reaper RN- [place], [place] Phone number: [phone]

## 2016-04-12 NOTE — ED Notes (Signed)
Patient transported to X-ray 

## 2016-04-12 NOTE — Progress Notes (Signed)
CRITICAL VALUE ALERT  Critical value received:  MRSA PCR nasal swab = positive  Date of notification:  04-12-2016  Time of notification:  08:45  Critical value read back:Yes.    Nurse who received alert:  Genelle Bal, RN  MD notified (1st page):  Eulas Post  Time of first page:  08:45  MD notified (2nd page):  Time of second page:  Responding MD:  Eulas Post  Time MD responded:  08:45

## 2016-04-12 NOTE — Consult Note (Signed)
Date: 04/12/2016               Patient Name:  Micheal [name] MRN: [id number]  DOB: 1955-12-11 Age / Sex: 60 y.o., male   PCP: Maurice Small, MD         Requesting Physician: Dr. Lily Kocher, MD    Consulting Reason:  Worsening renal function; hyperkalemia     Chief Complaint: SOB  History of Present Illness: Patient is a 60 yo M with a PMHx of CKD stage 5, DM2, HTN, dCHF (EF 60-65% in 05/2015), OSA, severe obesity presenting with a 1 day history of acute onset SOB thought to be secondary to acute respiratory failure with hypoxia and acute pulmonary edema in the setting of worsening renal function; also possible CAP. Nephrology was consulted due to patient's worsening renal function - BUN 81, SCr 9.6, GFR 6. He is also mildly hyperkalemic (K 5.4). Patient states he was last seen by a nephrologist at the [facility] over a year ago. He does have a L arm AVF which was placed 3 years ago but he has never received HD. Reports having good UOP and no trouble with passing urine. However, patient does mention feeling progressively more tired over time. States his appetite has decreased over time and he believes he has lost 12-13 lbs weight in the past 3 months (does take lasix for heart failure). Denies having any nausea, vomiting, pruritis, or mental status changes/ confusion.  States his legs are chronically swollen.   Meds: Current Facility-Administered Medications  Medication Dose Route Frequency Provider Last Rate Last Dose  . acetaminophen (TYLENOL) tablet 650 mg  650 mg Oral Q6H PRN Radene Gunning, NP   650 mg at 04/12/16 [id number]   Or  . acetaminophen (TYLENOL) suppository 650 mg  650 mg Rectal Q6H PRN Radene Gunning, NP      . albuterol (PROVENTIL) (2.5 MG/3ML) 0.083% nebulizer solution 2.5 mg  2.5 mg Nebulization Q4H Lezlie Octave Black, NP   2.5 mg at 04/12/16 1420  . albuterol (PROVENTIL) (2.5 MG/3ML) 0.083% nebulizer solution 2.5 mg  2.5 mg Nebulization Q2H PRN Radene Gunning, NP      . ALPRAZolam Duanne Moron) tablet  0.25 mg  0.25 mg Oral TID PRN Radene Gunning, NP      . amLODipine (NORVASC) tablet 10 mg  10 mg Oral Daily Radene Gunning, NP   10 mg at 04/12/16 [id number]  . antiseptic oral rinse (CPC / CETYLPYRIDINIUM CHLORIDE 0.05%) solution 7 mL  7 mL Mouth Rinse BID Rondell A Smith, MD   7 mL at 04/12/16 1000  . azithromycin (ZITHROMAX) 500 mg in dextrose 5 % 250 mL IVPB  500 mg Intravenous Q24H Lezlie Octave Black, NP      . azithromycin (ZITHROMAX) 500 MG injection           . bisacodyl (DULCOLAX) suppository 10 mg  10 mg Rectal Daily PRN Radene Gunning, NP      . cefTRIAXone (ROCEPHIN) 1 g in dextrose 5 % 50 mL IVPB  1 g Intravenous Q24H Radene Gunning, NP      . Chlorhexidine Gluconate Cloth 2 % PADS 6 each  6 each Topical Q0600 Lily Kocher, MD   6 each at 04/12/16 1326  . Darbepoetin Alfa (ARANESP) injection 100 mcg  100 mcg Intravenous Q Sat-HD Shela Leff, MD      . feeding supplement (ENSURE ENLIVE) (ENSURE ENLIVE) liquid 237 mL  237 mL Oral BID BM Rondell A  Tamala Julian, MD   237 mL at 04/12/16 1423  . fluticasone (FLONASE) 50 MCG/ACT nasal spray 2 spray  2 spray Each Nare Daily Radene Gunning, NP   2 spray at 04/12/16 0955  . furosemide (LASIX) 160 mg in dextrose 5 % 50 mL IVPB  160 mg Intravenous Once Radene Gunning, NP   160 mg at 04/12/16 1421  . hydrALAZINE (APRESOLINE) injection 20 mg  20 mg Intravenous Q6H PRN Lily Kocher, MD      . hydrALAZINE (APRESOLINE) tablet 75 mg  75 mg Oral Q8H Radene Gunning, NP   75 mg at 04/12/16 1420  . insulin aspart (novoLOG) injection 0-5 Units  0-5 Units Subcutaneous QHS Lezlie Octave Black, NP      . insulin aspart (novoLOG) injection 0-9 Units  0-9 Units Subcutaneous TID WC Radene Gunning, NP   7 Units at 04/12/16 1235  . isosorbide dinitrate (ISORDIL) tablet 30 mg  30 mg Oral TID Radene Gunning, NP   30 mg at 04/12/16 0951  . metoprolol succinate (TOPROL-XL) 24 hr tablet 200 mg  200 mg Oral Daily Lezlie Octave Black, NP   200 mg at 04/12/16 1000  . morphine 2 MG/ML injection 1 mg  1 mg  Intravenous Q3H PRN Lezlie Octave Black, NP      . mupirocin ointment (BACTROBAN) 2 % 1 application  1 application Nasal BID Lily Kocher, MD   1 application at [id number] [phone]  . ondansetron (ZOFRAN) tablet 4 mg  4 mg Oral Q6H PRN Radene Gunning, NP       Or  . ondansetron [facility]) injection 4 mg  4 mg Intravenous Q6H PRN Radene Gunning, NP      . pantoprazole (PROTONIX) EC tablet 40 mg  40 mg Oral Daily Radene Gunning, NP   40 mg at 04/12/16 0951  . sertraline (ZOLOFT) tablet 25 mg  25 mg Oral Daily Radene Gunning, NP   25 mg at 04/12/16 [id number]  . simvastatin (ZOCOR) tablet 20 mg  20 mg Oral q1800 Lily Kocher, MD        Allergies: Allergies as of 04/11/2016  . (No Known Allergies)   Past Medical History  Diagnosis Date  . Diabetes mellitus   . OSA (obstructive sleep apnea)     severe with AHI 72/hr  . Obesity (BMI 30-39.9)   . Chronic kidney disease     he is s/p AVF but not on HD yet  . Hypertension   . Anemia   . Acute respiratory failure with hypoxia ([place])   . CAP (community acquired pneumonia)   . Chronic diastolic heart failure [facility])    Past Surgical History  Procedure Laterality Date  . Av fistula placement     Family History  Problem Relation Age of Onset  . Heart failure Father   . Heart disease Father   . Heart failure Other    Social History   Social History  . Marital Status: Married    Spouse Name: N/A  . Number of Children: N/A  . Years of Education: N/A   Occupational History  . Not on file.   Social History Main Topics  . Smoking status: Never Smoker   . Smokeless tobacco: Not on file  . Alcohol Use: No  . Drug Use: No  . Sexual Activity: Not Currently   Other Topics Concern  . Not on file   Social History Narrative    Review of Systems: Pertinent  positives mentioned in HPI. Remainder of all ROS negative.   Physical Exam: Blood pressure 185/53, pulse 69, temperature 98.6 F (37 C), temperature source Oral, resp. rate 20, height 5\' 8"  (1.727 m),  weight 250 lb 3.6 oz (113.5 kg), SpO2 97 %. Physical Exam  Constitutional: He is oriented to person, place, and time. He appears well-developed and well-nourished.  HENT:  Head: Normocephalic and atraumatic.  Mouth/Throat: Oropharynx is clear and moist.  Eyes: EOM are normal.  Neck: Neck supple. No tracheal deviation present.  Cardiovascular: Normal rate, regular rhythm and intact distal pulses.  Exam reveals no gallop and no friction rub.   No murmur heard. Pulmonary/Chest:  On supplemental O2 via [place] Decreased bibasilar breath sounds with crackles.   Abdominal: Bowel sounds are normal. There is no tenderness. There is no guarding.  Obese  Musculoskeletal:  L arm AVF: palpable thrill  Trace pitting edema of LLE +1 pitting edema of RLE  Neurological: He is alert and oriented to person, place, and time.  Skin: Skin is warm and dry.    Lab results: Basic Metabolic Panel:  Recent Labs  04/12/16 0002  NA 136  K 5.4*  CL 110  CO2 14*  GLUCOSE 135*  BUN 81*  CREATININE 9.62*  CALCIUM 7.4*   Liver Function Tests:  Recent Labs  04/12/16 0002  AST 18  ALT 13*  ALKPHOS 70  BILITOT 0.5  PROT 7.6  ALBUMIN 3.3*    CBC:  Recent Labs  04/12/16 0002  WBC 12.4*  NEUTROABS 9.6*  HGB 8.1*  HCT 25.2*  MCV 83.4  PLT 281   CBG:  Recent Labs  04/12/16 0759 04/12/16 1136  GLUCAP 187* 277*   Anemia Panel:  Recent Labs  04/12/16 1026  VITAMINB12 304  FOLATE 6.8  FERRITIN 160  TIBC 242*  IRON 18*  RETICCTPCT 1.7   Imaging results:  Dg Chest 2 View  04/12/2016  CLINICAL DATA:  Shortness of breath beginning 1 hour ago. Dry cough for 1 week. EXAM: CHEST  2 VIEW COMPARISON:  05/25/2015 FINDINGS: Mild cardiomegaly. Small pleural effusion suggested laterally. There is perihilar opacity that is asymmetric. There is some fissural thickening but no Kerley lines or cephalized blood flow. IMPRESSION: Bilateral opacity favoring pneumonia over asymmetric edema. Followup PA  and lateral chest X-ray is recommended in 3-4 weeks following therapy. Electronically Signed   By: Monte Fantasia M.D.   On: 04/12/2016 00:35   Assessment, Plan, & Recommendations by Problem: Principal Problem:   Acute respiratory failure with hypoxia (HCC) Active Problems:   OSA (obstructive sleep apnea)   Obesity (BMI 30-39.9)   Chronic kidney disease   Hypertension   Acute on chronic renal failure (HCC)   CAP (community acquired pneumonia)   Anemia   Acute pulmonary edema (HCC)   Chronic diastolic heart failure (HCC)   Hyperkalemia   Acidosis   Accelerated hypertension  CKD stage V Patient is presenting with a 1 day history of acute onset SOB in the setting of worsening renal function. BUN 81, SCr 9.6, GFR 6. He is also mildly hyperkalemic (K 5.4). Recent labs done in 01/2016 at the [facility] showing similar renal function - BUN 74, SCr 9.5, GFR 7. As such, his renal function is likely not acutely worse; rather representing indolent progression of his CKD. Patient has not followed up with his [place] nephrologist in over a year. He does have a functioning L arm AVF but has never received HD. He is experiencing uremic symptoms  -  progressively more tired and appetite worsening over time. In addition, he is fluid overloaded on exam - SBP in 170s-180s, CXR suggestive of pulmonary edema, and has b/l LE edema. Due to patient's worsening renal function with uremic symptoms and fluid overload, it is reasonable to start HD at this time. Patient agrees with the plan. -HD today: UF goal 1L, BFR 200 -F/u am renal function panel -UA pending -PTH pending   Hyperkalemia Mildly hyperkalemic with K 5.4 in the setting of CKD V. He is not taking any K supplement at home.  -HD today -F/u am renal function panel   IDA Hgb 8.1; baseline is approximately 9. Iron studies showing low iron (18) and low saturation ratio (7%). Ferritin normal (160).  -IV Feraheme 160 mg once  -Aranesp 100 mcg once a week  Acute  hypoxic respiratory failure and CAP CXR is suggestive of pulmonary edema along with PNA.  -Continue supplemental O2, nebs, IV antibiotics (Rocephin and Azithromycin)  dCHF Echo from 05/2015 showing EF 60-65%. He is fluid overloaded on exam - SBP in 170s-180s, CXR suggestive of pulmonary edema, and has b/l LE edema. -Patient has already received a total of 180 mg of IV lasix today; will reassess tomorrow to decide further dosing  -Daily weight; strict I/Os  HTN BP in 170s-180s.  -HD today  -Hold ACEi -Already received Lasix as above  -Continue other home meds: Amlodipine, hydralazine, isosorbide dinitrate, and metoprolol    DM2 -Mgmt per primary team   BONES Check phos and PTH tomorrow AM and treat as needed   Dispo: Patient seems to have a good understanding of his medical condition and agrees to start dialysis today. His wife does mention that they are leaving for a 1 week long non-refundable vacation to [place] in a week which presents a challenge. Patient and wife do understand that they need to go to an emergency room with his records during the vacation as placement at a dialysis center at [facility] at such a short notice might be difficult.    Signed: Shela Leff, MD 04/12/2016, 2:23 PM   Patient seen and examined, agree with above note with above modifications. 60 year old BM with advanced [facility] has not seen a nephrologist for over a year but does have AVF in place.  He presents to the hospital mostly with volume overload symptoms +/- PNA but upon further investigation he does seem to have uremic symptoms.  I explained to him that the most efficient thing to correct all of his issues would be to start HD- he is agreeable.  For first treatment today via AVF (hopefully usable) second on Monday- will also CLIP to OP HD unit and treat anemia and secondary hyperparathyroidism as needed  Micheal Parish, MD 04/12/2016

## 2016-04-12 NOTE — ED Notes (Signed)
Report to Avis, RN

## 2016-04-12 NOTE — Procedures (Signed)
Patient was seen on dialysis and the procedure was supervised.  BFR 150  Via AVF BP is  172/67.   Patient appears to be tolerating treatment well  Micheal [name] A 04/12/2016

## 2016-04-12 NOTE — Progress Notes (Signed)
Initial Nutrition Assessment  DOCUMENTATION CODES:   Obesity unspecified  INTERVENTION:  Provide Nepro Shake po once daily PRN, each supplement provides 425 kcal and 19 grams protein  Monitor PO intake for adequacy  NUTRITION DIAGNOSIS:   Unintentional weight loss related to poor appetite as evidenced by per patient/family report.   GOAL:   Patient will meet greater than or equal to 90% of their needs   MONITOR:   PO intake, Supplement acceptance, Labs, I & O's, Weight trends  REASON FOR ASSESSMENT:   Malnutrition Screening Tool    ASSESSMENT:   60 yo M with a PMHx of CKD stage 5, DM2, HTN, dCHF (EF 60-65% in 05/2015), OSA, severe obesity presenting with a 1 day history of acute onset SOB thought to be secondary to acute respiratory failure with hypoxia and acute pulmonary edema in the setting of worsening renal function; also possible CAP.   Limited assessment due to pt getting ready to head to dHD at time of visit. Pt reports having a decreased appetite PTA, but is unsure how long this has been going on. Wife at bedside states that patient has lost about 10 lbs in the past 90 days. Based on wife's report, pt has lost about 4% of his body weight which is not significant for time frame. Pt reports eating well at breakfast (75% per nursing notes), but less at lunch. He drank an Ensure earlier today. Pt states that he does not follow a renal diet at home. Pt appears well-nourished. Per MD note, pt eats out once daily. RD left "Food Pyramid for Healthy Eating with Kidney Disease" handout at pt's bedside.   Labs: low iron, low TIBC, low hemoglobin, low calcium, low GFR  Diet Order:  Diet renal/carb modified with fluid restriction Diet-HS Snack?: Nothing; Room service appropriate?: Yes; Fluid consistency:: Thin  Skin:  Reviewed, no issues  Last BM:  7/21  Height:   Ht Readings from Last 1 Encounters:  04/12/16 5\' 8"  (1.727 m)    Weight:   Wt Readings from Last 1  Encounters:  04/12/16 250 lb 3.6 oz (113.5 kg)    Ideal Body Weight:  70 kg  BMI:  Body mass index is 38.05 kg/(m^2).  Estimated Nutritional Needs:   Kcal:  2100-2300  Protein:  70-80 grams  Fluid:  per MD  EDUCATION NEEDS:   No education needs identified at this time  [place], LDN Inpatient Clinical Dietitian Pager: [phone] After Hours Pager: [phone]

## 2016-04-12 NOTE — Progress Notes (Signed)
Accepted to telemetry bed at [facility] under inpatient status. Coming from [facility] (Dr. Florina Ou, [place]) where he p/w several days worsening SOB. 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 CXR features b/l opacities read as likely PNA rather than edema. There is a leukocytosis, but no fever. BUN now 81 and SCr 9.62, up from 70 and 5-6 range previously. Vitals have been stable and there has not been a supplemental O2 requirement. Started on empiric Rocephin and azithromycin. Will need a nephrology consultation.

## 2016-04-12 NOTE — ED Provider Notes (Addendum)
CSN: [id number]     Arrival date & time 04/11/16  2341 History   First MD Initiated Contact with Patient 04/12/16 0226     Chief Complaint  Patient presents with  . Shortness of Breath     (Consider location/radiation/quality/duration/timing/severity/associated sxs/prior Treatment) HPI  This is a 60 year old male with chronic renal disease. He has not yet on hemodialysis. He is here with a three-day history of cough and shortness of breath began yesterday evening. The shortness of breath is moderate and worse with exertion. He denies fever, chills, chest pain, nausea, vomiting or diarrhea. He was given an albuterol neb treatment by EMS prior to arrival with equivocal relief.  Past Medical History  Diagnosis Date  . Diabetes mellitus   . OSA (obstructive sleep apnea)     severe with AHI 72/hr  . Obesity (BMI 30-39.9)   . Chronic kidney disease     he is s/p AVF but not on HD yet  . Hypertension    Past Surgical History  Procedure Laterality Date  . Av fistula placement     Family History  Problem Relation Age of Onset  . Heart failure Father   . Heart disease Father   . Heart failure Other    Social History  Substance Use Topics  . Smoking status: Never Smoker   . Smokeless tobacco: None  . Alcohol Use: No    Review of Systems  All other systems reviewed and are negative.   Allergies  Review of patient's allergies indicates no known allergies.  Home Medications   Prior to Admission medications   Medication Sig Start Date End Date Taking? Authorizing Provider  ALPRAZolam Duanne Moron) 0.25 MG tablet Take 1 tablet as needed 3 times a day for panic attacks 05/27/15   Historical Provider, MD  amLODipine (NORVASC) 10 MG tablet Take 10 mg by mouth daily.    Historical Provider, MD  calcium acetate (PHOSLO) 667 MG capsule Take by mouth 3 (three) times daily with meals.    Historical Provider, MD  fluticasone (FLONASE) 50 MCG/ACT nasal spray Place 2 sprays into both nostrils  daily. 04/24/15   Historical Provider, MD  furosemide (LASIX) 20 MG tablet Take 40 mg by mouth 2 (two) times daily. 40 mg qam, 60 mg midday.    Historical Provider, MD  glipiZIDE (GLUCOTROL) 10 MG tablet Take 20 mg by mouth 2 (two) times daily.      Historical Provider, MD  hydrALAZINE (APRESOLINE) 50 MG tablet Take 1.5 tablets (75 mg total) by mouth 3 (three) times daily. 05/30/15   Rhonda G Barrett, PA-C  insulin NPH (HUMULIN N,NOVOLIN N) 100 UNIT/ML injection Inject 52 Units into the skin 2 (two) times daily.      Historical Provider, MD  isosorbide dinitrate (ISORDIL) 30 MG tablet Take 1 tablet (30 mg total) by mouth 3 (three) times daily. 06/27/15   Skeet Latch, MD  lisinopril (PRINIVIL,ZESTRIL) 40 MG tablet Take 40 mg by mouth daily.      Historical Provider, MD  metoprolol (TOPROL XL) 200 MG 24 hr tablet Take 1 tablet (200 mg total) by mouth daily. 06/27/15   Skeet Latch, MD  omega-3 acid ethyl esters (LOVAZA) 1 G capsule Take 1 g by mouth daily.      Historical Provider, MD  omeprazole (PRILOSEC) 20 MG capsule Take 20 mg by mouth daily.      Historical Provider, MD  sertraline (ZOLOFT) 50 MG tablet Take 0.5 tablets by mouth daily.  05/27/15   Historical  Provider, MD  simvastatin (ZOCOR) 20 MG tablet Take 20 mg by mouth daily.      Historical Provider, MD   BP 164/57 mmHg  Pulse 71  Temp(Src) 98.9 F (37.2 C) (Oral)  Resp 22  Ht 5\' 8"  (1.727 m)  Wt 250 lb (113.399 kg)  BMI 38.02 kg/m2  SpO2 95%   Physical Exam  General: Well-developed, well-nourished male in no acute distress; appearance consistent with age of record HENT: normocephalic; atraumatic Eyes: pupils equal, round and reactive to light; extraocular muscles intact Neck: supple Heart: regular rate and rhythm Lungs: clear to auscultation bilaterally Abdomen: soft; nondistended; nontender; bowel sounds present Extremities: No deformity; full range of motion; pulses normal; dialysis fistula left forearm with pulse and  thrill Neurologic: Awake, alert and oriented; motor function intact in all extremities and symmetric; no facial droop Skin: Warm and dry Psychiatric: Normal mood and affect    ED Course  Procedures (including critical care time)   EKG Interpretation   Date/Time:  Saturday April 12 2016 00:07:59 EDT Ventricular Rate:  67 PR Interval:  184 QRS Duration: 86 QT Interval:  448 QTC Calculation: 473 R Axis:   102 Text Interpretation:  Normal sinus rhythm with sinus arrhythmia Rightward  axis Borderline ECG No significant change was found Confirmed by Younique Casad   MD, Jenny Reichmann ([id number]) on 04/12/2016 1:48:45 AM      MDM   Nursing notes and vitals signs, including pulse oximetry, reviewed.  Summary of this visit's results, reviewed by myself:  Labs:  Results for orders placed or performed during the hospital encounter of 04/11/16 (from the past 24 hour(s))  CBC with Differential     Status: Abnormal   Collection Time: 04/12/16 12:02 AM  Result Value Ref Range   WBC 12.4 (H) 4.0 - 10.5 K/uL   RBC 3.02 (L) 4.22 - 5.81 MIL/uL   Hemoglobin 8.1 (L) 13.0 - 17.0 g/dL   HCT 25.2 (L) 39.0 - 52.0 %   MCV 83.4 78.0 - 100.0 fL   MCH 26.8 26.0 - 34.0 pg   MCHC 32.1 30.0 - 36.0 g/dL   RDW 16.1 (H) 11.5 - 15.5 %   Platelets 281 150 - 400 K/uL   Neutrophils Relative % 77 %   Neutro Abs 9.6 (H) 1.7 - 7.7 K/uL   Lymphocytes Relative 15 %   Lymphs Abs 1.8 0.7 - 4.0 K/uL   Monocytes Relative 6 %   Monocytes Absolute 0.8 0.1 - 1.0 K/uL   Eosinophils Relative 1 %   Eosinophils Absolute 0.2 0.0 - 0.7 K/uL   Basophils Relative 1 %   Basophils Absolute 0.1 0.0 - 0.1 K/uL  Comprehensive metabolic panel     Status: Abnormal   Collection Time: 04/12/16 12:02 AM  Result Value Ref Range   Sodium 136 135 - 145 mmol/L   Potassium 5.4 (H) 3.5 - 5.1 mmol/L   Chloride 110 101 - 111 mmol/L   CO2 14 (L) 22 - 32 mmol/L   Glucose, Bld 135 (H) 65 - 99 mg/dL   BUN 81 (H) 6 - 20 mg/dL   Creatinine, Ser 9.62 (H)  0.61 - 1.24 mg/dL   Calcium 7.4 (L) 8.9 - 10.3 mg/dL   Total Protein 7.6 6.5 - 8.1 g/dL   Albumin 3.3 (L) 3.5 - 5.0 g/dL   AST 18 15 - 41 U/L   ALT 13 (L) 17 - 63 U/L   Alkaline Phosphatase 70 38 - 126 U/L   Total Bilirubin 0.5 0.3 -  1.2 mg/dL   GFR calc non Af Amer 5 (L) >60 mL/min   GFR calc Af Amer 6 (L) >60 mL/min   Anion gap 12 5 - 15  Brain natriuretic peptide     Status: Abnormal   Collection Time: 04/12/16 12:02 AM  Result Value Ref Range   B Natriuretic Peptide 140.1 (H) 0.0 - 100.0 pg/mL    Imaging Studies: Dg Chest 2 View  04/12/2016  CLINICAL DATA:  Shortness of breath beginning 1 hour ago. Dry cough for 1 week. EXAM: CHEST  2 VIEW COMPARISON:  05/25/2015 FINDINGS: Mild cardiomegaly. Small pleural effusion suggested laterally. There is perihilar opacity that is asymmetric. There is some fissural thickening but no Kerley lines or cephalized blood flow. IMPRESSION: Bilateral opacity favoring pneumonia over asymmetric edema. Followup PA and lateral chest X-ray is recommended in 3-4 weeks following therapy. Electronically Signed   By: Monte Fantasia M.D.   On: 04/12/2016 00:35   2:37 AM  Patient's BUN/creatinine of substantially worsened in the past 8 months. I suspect that time his come for him to be dialyzed. Patient has not had a fever and I suspect his x-ray findings are more likely due to pulmonary edema from renal failure as opposed to an infectious process. We will give Rocephin and Zithromax as a precaution.  3:08 AM Dr. Myna Hidalgo accepts for transfer to [facility].   Shanon Rosser, MD 04/12/16 [id number]  Shanon Rosser, MD 04/12/16 [id number]  Shanon Rosser, MD 04/12/16 [phone]

## 2016-04-12 NOTE — H&P (Signed)
History and Physical    Micheal [name] [id number] DOB: [birth year] DOA: 04/11/2016  PCP: Jonathon Bellows, MD Patient coming from: home  Chief Complaint: sob  HPI: Micheal [name] is a 60 y.o. male with medical history significant for diabetes, hypertension, chronic diastolic heart failure, chronic kidney disease stage IV, obesity presents to the emergency department with chief complaint shortness of breath. Initial evaluation reveals acute respiratory failure with hypoxia, pulmonary edema, acute on chronic renal failure.  Information is obtained from the patient and his wife who is at the bedside. He states he awakened this morning with sudden onset shortness of breath. He walked to the bathroom in the shortness of breath became worse. Associated symptoms include nonproductive cough for the last 2-3 days. Also endorses some generalized chest pain with coughing. He denies fever chills headache dizziness syncope or near-syncope. He denies nausea diaphoresis abdominal pain dysuria hematuria frequency or urgency. He denies diarrhea constipation melena or bright red blood per rectum. He reports checking his blood sugar "every now and then".    ED Course: In the emergency department he is afebrile blood pressure high and of normal oxygen saturation level 88% on room air. He is given Rocephin and azithromycin in the emergency department  Review of Systems: As per HPI otherwise 10 point review of systems negative.   Ambulatory Status: He ambulates independently with steady gait  Past Medical History  Diagnosis Date  . Diabetes mellitus   . OSA (obstructive sleep apnea)     severe with AHI 72/hr  . Obesity (BMI 30-39.9)   . Chronic kidney disease     he is s/p AVF but not on HD yet  . Hypertension   . Anemia   . Acute respiratory failure with hypoxia ([place])   . CAP (community acquired pneumonia)   . Chronic diastolic heart failure [facility])     Past Surgical History  Procedure Laterality Date   . Av fistula placement      Social History   Social History  . Marital Status: Married    Spouse Name: N/A  . Number of Children: N/A  . Years of Education: N/A   Occupational History  . Not on file.   Social History Main Topics  . Smoking status: Never Smoker   . Smokeless tobacco: Not on file  . Alcohol Use: No  . Drug Use: No  . Sexual Activity: Not Currently   Other Topics Concern  . Not on file   Social History Narrative   He lives at home with his wife No Known Allergies  Family History  Problem Relation Age of Onset  . Heart failure Father   . Heart disease Father   . Heart failure Other     Prior to Admission medications   Medication Sig Start Date End Date Taking? Authorizing Provider  ALPRAZolam Duanne Moron) 0.25 MG tablet Take 1 tablet as needed 3 times a day for panic attacks 05/27/15  Yes Historical Provider, MD  amLODipine (NORVASC) 10 MG tablet Take 10 mg by mouth daily.   Yes Historical Provider, MD  calcium acetate (PHOSLO) 667 MG capsule Take by mouth 3 (three) times daily with meals.   Yes Historical Provider, MD  fluticasone (FLONASE) 50 MCG/ACT nasal spray Place 2 sprays into both nostrils daily. 04/24/15  Yes Historical Provider, MD  furosemide (LASIX) 20 MG tablet Take 40 mg by mouth 2 (two) times daily. 40 mg qam, 60 mg midday.   Yes Historical Provider, MD  glipiZIDE (  GLUCOTROL) 10 MG tablet Take 20 mg by mouth 2 (two) times daily.     Yes Historical Provider, MD  hydrALAZINE (APRESOLINE) 50 MG tablet Take 1.5 tablets (75 mg total) by mouth 3 (three) times daily. 05/30/15  Yes Rhonda G Barrett, PA-C  insulin NPH (HUMULIN N,NOVOLIN N) 100 UNIT/ML injection Inject 20 Units into the skin 2 (two) times daily.    Yes Historical Provider, MD  isosorbide dinitrate (ISORDIL) 30 MG tablet Take 1 tablet (30 mg total) by mouth 3 (three) times daily. 06/27/15  Yes Skeet Latch, MD  lisinopril (PRINIVIL,ZESTRIL) 40 MG tablet Take 40 mg by mouth daily.     Yes  Historical Provider, MD  metoprolol (TOPROL XL) 200 MG 24 hr tablet Take 1 tablet (200 mg total) by mouth daily. 06/27/15  Yes Skeet Latch, MD  omega-3 acid ethyl esters (LOVAZA) 1 G capsule Take 1 g by mouth daily.     Yes Historical Provider, MD  omeprazole (PRILOSEC) 20 MG capsule Take 20 mg by mouth daily.     Yes Historical Provider, MD  sertraline (ZOLOFT) 50 MG tablet Take 0.5 tablets by mouth daily.  05/27/15  Yes Historical Provider, MD  simvastatin (ZOCOR) 20 MG tablet Take 20 mg by mouth daily.     Yes Historical Provider, MD    Physical Exam: Filed Vitals:   04/12/16 [id number] 04/12/16 [id number] 04/12/16 0623 04/12/16 0801  BP: 173/57   185/53  Pulse: 71 72  69  Temp: 98.5 F (36.9 C)   98.6 F (37 C)  TempSrc: Oral   Oral  Resp: 20     Height: 5\' 8"  (1.727 m)     Weight: 113.5 kg (250 lb 3.6 oz)     SpO2: 93% 93% 97% 97%     General:  Appears calm and comfortable, obese Eyes:  PERRL, EOMI, normal lids, iris ENT:  grossly normal hearing, lips & tongue, mucous membranes of his mouth are slightly dry Neck:  no LAD, masses or thyromegaly Cardiovascular:  RRR, no m/r/g. Trace LE edema.  Respiratory:  Breath sounds somewhat distant throughout., no w/r/r. Normal respiratory effort. Abdomen:  soft, ntnd, positive bowel sounds throughout no guarding or rebounding Skin:  no rash or induration seen on limited exam Musculoskeletal:  grossly normal tone BUE/BLE, good ROM, no bony abnormality Psychiatric:  grossly normal mood and affect, speech fluent and appropriate, AOx3 Neurologic:  CN 2-12 grossly intact, moves all extremities in coordinated fashion, sensation intact  Labs on Admission: I have personally reviewed following labs and imaging studies  CBC:  Recent Labs Lab 04/12/16 0002  WBC 12.4*  NEUTROABS 9.6*  HGB 8.1*  HCT 25.2*  MCV 83.4  PLT [id number]   Basic Metabolic Panel:  Recent Labs Lab 04/12/16 0002  NA 136  K 5.4*  CL 110  CO2 14*  GLUCOSE 135*  BUN 81*    CREATININE 9.62*  CALCIUM 7.4*   GFR: Estimated Creatinine Clearance: 10.4 mL/min (by C-G formula based on Cr of 9.62). Liver Function Tests:  Recent Labs Lab 04/12/16 0002  AST 18  ALT 13*  ALKPHOS 70  BILITOT 0.5  PROT 7.6  ALBUMIN 3.3*   No results for input(s): LIPASE, AMYLASE in the last 168 hours. No results for input(s): AMMONIA in the last 168 hours. Coagulation Profile: No results for input(s): INR, PROTIME in the last 168 hours. Cardiac Enzymes: No results for input(s): CKTOTAL, CKMB, CKMBINDEX, TROPONINI in the last 168 hours. BNP (last 3 results) No results  for input(s): PROBNP in the last 8760 hours. HbA1C: No results for input(s): HGBA1C in the last 72 hours. CBG:  Recent Labs Lab 04/12/16 0759  GLUCAP 187*   Lipid Profile: No results for input(s): CHOL, HDL, LDLCALC, TRIG, CHOLHDL, LDLDIRECT in the last 72 hours. Thyroid Function Tests: No results for input(s): TSH, T4TOTAL, FREET4, T3FREE, THYROIDAB in the last 72 hours. Anemia Panel: No results for input(s): VITAMINB12, FOLATE, FERRITIN, TIBC, IRON, RETICCTPCT in the last 72 hours. Urine analysis: No results found for: COLORURINE, APPEARANCEUR, LABSPEC, PHURINE, GLUCOSEU, HGBUR, BILIRUBINUR, KETONESUR, PROTEINUR, UROBILINOGEN, NITRITE, LEUKOCYTESUR  Creatinine Clearance: Estimated Creatinine Clearance: 10.4 mL/min (by C-G formula based on Cr of 9.62).  Sepsis Labs: @LABRCNTIP (procalcitonin:4,lacticidven:4) ) Recent Results (from the past 240 hour(s))  MRSA PCR Screening     Status: Abnormal   Collection Time: 04/12/16  6:46 AM  Result Value Ref Range Status   MRSA by PCR POSITIVE (A) NEGATIVE Final    Comment:        The GeneXpert MRSA Assay (FDA approved for NASAL specimens only), is one component of a comprehensive MRSA colonization surveillance program. It is not intended to diagnose MRSA infection nor to guide or monitor treatment for MRSA infections. RESULT CALLED TO, READ BACK  BY AND VERIFIED WITH: A. BRAKE 07.22.2017 [id number] N.MORRIS       Radiological Exams on Admission: Dg Chest 2 View  04/12/2016  CLINICAL DATA:  Shortness of breath beginning 1 hour ago. Dry cough for 1 week. EXAM: CHEST  2 VIEW COMPARISON:  05/25/2015 FINDINGS: Mild cardiomegaly. Small pleural effusion suggested laterally. There is perihilar opacity that is asymmetric. There is some fissural thickening but no Kerley lines or cephalized blood flow. IMPRESSION: Bilateral opacity favoring pneumonia over asymmetric edema. Followup PA and lateral chest X-ray is recommended in 3-4 weeks following therapy. Electronically Signed   By: Monte Fantasia M.D.   On: 04/12/2016 00:35    EKG: Independently reviewed. Normal sinus rhythm with sinus arrhythmia Rightward axis Borderline ECG No significant change was found  Assessment/Plan Principal Problem:   Acute respiratory failure with hypoxia (HCC) Active Problems:   OSA (obstructive sleep apnea)   Obesity (BMI 30-39.9)   Chronic kidney disease   Hypertension   Acute on chronic renal failure (HCC)   CAP (community acquired pneumonia)   Anemia   Acute pulmonary edema (HCC)   Chronic diastolic heart failure (HCC)   Hyperkalemia   Acidosis   1. Acute respiratory failure with hypoxia likely related to acute pulmonary edema secondary to acute on chronic renal failure and possible community-acquired pneumonia. Oxygen saturation level 88% on room air. Chest x-ray reveals bilateral opacity favoring pneumonia over asymmetric edema. WBCs 12.4 BNP 140.  Creatinine 9.62 up from 5.3 10 months ago. She is afebrile and nontoxic appearing -Admit to telemetry -Oxygen supplementation -IV Lasix -Hold ACE inhibitor for now -Nebulizers -Continue antibiotics for 24 more hours -Nephrology consult  #2. Acute pulmonary edema/acute on chronic diastolic heart failure secondary to acute on chronic renal failure.  medications include Lasix 40 mg in the morning and 60 mg  midday. Echo done September 2016 reveals EF 55% and grade 2 diastolic dysfunction. Chest x-ray as noted above -IV Lasix 1 for now -Nephrology consult requested and recommended additional 160mg  lasix -Obtain daily weights -Monitor intake and output -Continue home beta blocker  #3. Acute on chronic renal failure stage V. Creatinine 9.62 on admission. Chart review indicates 10 months ago creatinine 5.3. Had a fistula placed in left wrist via  the [place] 2 years ago. Not currently on dialysis. Chart review indicates patient with a history of difficult to control blood pressure -Nephrology consult -IV Lasix 1 as noted above -Monitor intake and output  #4. CAP. Chest x-ray with some concern for pneumonia. He does have a mild leukocytosis but is afebrile and nontoxic appearing. He received Rocephin and azithromycin in the emergency department -We'll continue antibiotics for now -Strep pneumo urine antigen -legionella antigen -influenza pcr -blood cultures -Nebulizers  #5. Hypertension. Fair control in the emergency department. Home medications include amlodipine, Lasix, hydralazine, isosorbide dinitrate, Cipro, metoprolol -Hold ACE inhibitor for now -Continue home medications otherwise -Monitor  #6. Anemia. Likely of chronic disease. Hemoglobin 8.1 on admission. Most recent lab work in chart 10 months ago hemoglobin 9.2. No signs symptoms of active bleeding. -Anemia panel -Monitor  #7. Acidosis/hyperkalemia. Likely related to #3. Potassium is 5.4 on admission. -Nephrology consult -Monitor  #8. Obstructive sleep apnea. Noncompliance with CPap -respiratory consult for sleep apnea    DVT prophylaxis: scd Code Status: full  Family Communication: wife at bedside  Disposition Plan:  home Consults called: nephrology  Admission status: obs    Radene Gunning MD [facility]  If 7PM-7AM, please contact night-coverage [email or web address] Password [facility]  04/12/2016, 8:49 AM

## 2016-04-13 DIAGNOSIS — J9601 Acute respiratory failure with hypoxia: Secondary | ICD-10-CM

## 2016-04-13 DIAGNOSIS — G4733 Obstructive sleep apnea (adult) (pediatric): Secondary | ICD-10-CM

## 2016-04-13 DIAGNOSIS — E875 Hyperkalemia: Secondary | ICD-10-CM

## 2016-04-13 DIAGNOSIS — I5032 Chronic diastolic (congestive) heart failure: Secondary | ICD-10-CM

## 2016-04-13 LAB — URINE MICROSCOPIC-ADD ON

## 2016-04-13 LAB — GLUCOSE, CAPILLARY
GLUCOSE-CAPILLARY: 238 mg/dL — AB (ref 65–99)
GLUCOSE-CAPILLARY: 162 mg/dL — AB (ref 65–99)
Glucose-Capillary: 186 mg/dL — ABNORMAL HIGH (ref 65–99)
Glucose-Capillary: 251 mg/dL — ABNORMAL HIGH (ref 65–99)

## 2016-04-13 LAB — URINALYSIS, ROUTINE W REFLEX MICROSCOPIC
Bilirubin Urine: NEGATIVE
GLUCOSE, UA: 100 mg/dL — AB
Ketones, ur: NEGATIVE mg/dL
Leukocytes, UA: NEGATIVE
NITRITE: NEGATIVE
PH: 6.5 (ref 5.0–8.0)
Protein, ur: >300 mg/dL — AB
SPECIFIC GRAVITY, URINE: 1.016 (ref 1.005–1.030)

## 2016-04-13 LAB — RENAL FUNCTION PANEL
ANION GAP: 10 (ref 5–15)
Albumin: 2.7 g/dL — ABNORMAL LOW (ref 3.5–5.0)
BUN: 61 mg/dL — ABNORMAL HIGH (ref 6–20)
CALCIUM: 7.6 mg/dL — AB (ref 8.9–10.3)
CHLORIDE: 106 mmol/L (ref 101–111)
CO2: 19 mmol/L — AB (ref 22–32)
Creatinine, Ser: 7.75 mg/dL — ABNORMAL HIGH (ref 0.61–1.24)
GFR calc non Af Amer: 7 mL/min — ABNORMAL LOW (ref >60)
GFR, EST AFRICAN AMERICAN: 8 mL/min — AB (ref >60)
Glucose, Bld: 196 mg/dL — ABNORMAL HIGH (ref 65–99)
POTASSIUM: 4.5 mmol/L (ref 3.5–5.1)
Phosphorus: 4.9 mg/dL — ABNORMAL HIGH (ref 2.5–4.6)
Sodium: 135 mmol/L (ref 135–145)

## 2016-04-13 LAB — CBC
HEMATOCRIT: 23.1 % — AB (ref 39.0–52.0)
HEMOGLOBIN: 7.5 g/dL — AB (ref 13.0–17.0)
MCH: 26.5 pg (ref 26.0–34.0)
MCHC: 32.5 g/dL (ref 30.0–36.0)
MCV: 81.6 fL (ref 78.0–100.0)
Platelets: 258 10*3/uL (ref 150–400)
RBC: 2.83 MIL/uL — AB (ref 4.22–5.81)
RDW: 16.4 % — ABNORMAL HIGH (ref 11.5–15.5)
WBC: 11.3 10*3/uL — ABNORMAL HIGH (ref 4.0–10.5)

## 2016-04-13 LAB — PROCALCITONIN: Procalcitonin: 1.45 ng/mL

## 2016-04-13 LAB — HEPATITIS B CORE ANTIBODY, TOTAL: Hep B Core Total Ab: NEGATIVE

## 2016-04-13 LAB — STREP PNEUMONIAE URINARY ANTIGEN: Strep Pneumo Urinary Antigen: NEGATIVE

## 2016-04-13 LAB — HEPATITIS B SURFACE ANTIBODY,QUALITATIVE: Hep B S Ab: NONREACTIVE

## 2016-04-13 LAB — HIV ANTIBODY (ROUTINE TESTING W REFLEX): HIV SCREEN 4TH GENERATION: NONREACTIVE

## 2016-04-13 LAB — HEPATITIS B SURFACE ANTIGEN: HEP B S AG: NEGATIVE

## 2016-04-13 MED ORDER — DEXTROSE 5 % IV SOLN
160.0000 mg | Freq: Two times a day (BID) | INTRAVENOUS | Status: AC
Start: 2016-04-13 — End: 2016-04-14
  Administered 2016-04-13: 160 mg via INTRAVENOUS
  Filled 2016-04-13 (×2): qty 16

## 2016-04-13 MED ORDER — AZITHROMYCIN 500 MG PO TABS
500.0000 mg | ORAL_TABLET | ORAL | Status: AC
  Administered 2016-04-13 – 2016-04-16 (×4): 500 mg via ORAL
  Filled 2016-04-13 (×4): qty 1

## 2016-04-13 MED ORDER — ALBUTEROL SULFATE (2.5 MG/3ML) 0.083% IN NEBU
2.5000 mg | INHALATION_SOLUTION | Freq: Two times a day (BID) | RESPIRATORY_TRACT | Status: DC
  Administered 2016-04-14 (×2): 2.5 mg via RESPIRATORY_TRACT
  Filled 2016-04-13 (×2): qty 3

## 2016-04-13 NOTE — Progress Notes (Signed)
Nephrology daily progress note  Subjective: Patient was seen and examined at bedside. Reports feeling well and states dialysis went very well. States his breathing is better. Removed one liter- 200 BFR with HD yest  Objective: Vital signs in last 24 hours: Vitals:   04/12/16 1806 04/12/16 1946 04/13/16 0537 04/13/16 0812  BP: (!) 180/62 (!) 122/49 (!) 157/55 (!) 179/60  Pulse: 72 68 73 76  Resp: (!) 21 (!) 21 20 18   Temp: 98.9 F (37.2 C) 98 F (36.7 C) 99.4 F (37.4 C) 99.3 F (37.4 C)  TempSrc: Oral Oral Oral Oral  SpO2: 99% 99% 96% 98%  Weight:      Height:       Physical Exam Constitutional: He is oriented to person, place, and time. He appears well-developed and well-nourished.  Eyes: EOM are normal.  Cardiovascular: Normal rate, regular rhythm and intact distal pulses.  Exam reveals no gallop and no friction rub. No murmur heard. Pulmonary/Chest:  On supplemental O2 via [place] Decreased bibasilar breath sounds with crackles (improved since yesterday)   Abdominal: Bowel sounds are normal. There is no tenderness. There is no guarding.  Obese  Musculoskeletal:  L arm AVF: palpable thrill  Trace pitting edema of b/l LEs Neurological: He is alert and oriented to person, place, and time.  Skin: Skin is warm and dry.   Assessment/Plan: Principal Problem:   Acute respiratory failure with hypoxia (HCC) Active Problems:   OSA (obstructive sleep apnea)   Obesity (BMI 30-39.9)   Chronic kidney disease   Hypertension   Acute on chronic renal failure (HCC)   CAP (community acquired pneumonia)   Anemia   Acute pulmonary edema (HCC)   Chronic diastolic heart failure (HCC)   Hyperkalemia   Acidosis   Accelerated hypertension  CKD stage V Patient presenting with progression of his CKD, uremic symptoms, and fluid overload - BUN 81, SCr 9.6, GFR 6. He was also mildly hyperkalemic (K 5.4) on admission. He underwent his first HD yesterday (UF 1L). Today, BP continues to be elevated  (systolic in [id number]), however, lung sound better on exam and lower extremity edema has decreased. He tolerated the procedure well. Plan is for him to receive HD on Monday (7/24), will use IV Lasix today. -IV Lasix 160 mg twice today -HD on 7/24 -F/u am renal function panel - making arrangements for OP dialysis   Hyperkalemia (resolved) Mildly hyperkalemic on admission with K 5.4 in the setting of CKD V. Received HD yesterday; K improved to 4.5 today.  -CTM -F/u am renal function panel   Bones Phos mildly high at 4.9. No binder yet -CTM -HD on 7/24   -PTH pending   IDA Hgb 7.5 today; baseline is approximately 9. Iron studies c/w IDA. He received a dose of IV Feraheme 160 mg yesterday.   -Continue Aranesp 100 mcg once a week  Acute hypoxic respiratory failure and CAP CXR is suggestive of pulmonary edema along with PNA.  -Continue supplemental O2, nebs, IV antibiotics (Rocephin and Azithromycin)  dCHF Echo from 05/2015 showing EF 60-65%. Fluid overloaded - BP continues to be elevated (systolic in [id number]), however, lung sound better on exam and lower extremity edema has decreased. -IV Lasix 160 mg twice today   -Daily weight; strict I/Os  HTN BP in 160s-180s.  -Next HD on 7/24  -Continue to hold ACEi -IV Lasix 160 mg twice today -Continue other home meds: Amlodipine, hydralazine, isosorbide dinitrate, and metoprolol  - should be able to titrate down I  think when volume status is better with HD  DM2 -Mgmt per primary team   Shela Leff, MD 04/13/2016, 10:24 AM Pager: [phone]  Patient seen and examined, agree with above note with above modifications. Did well with first HD treatment- AVF usable- will give IV lasix today then have him go for second treatment tomorrow via AVF- on ESA/iron- PTH pending- making arrangements for OP HD  Corliss Parish, MD 04/13/2016

## 2016-04-13 NOTE — Progress Notes (Signed)
PROGRESS NOTE  Micheal [name] [id number] DOB: [birth year] DOA: 04/11/2016 PCP: Jonathon Bellows, MD  Brief History:  60 year old male with a history of CKD stage V, diabetes mellitus, hypertension, diastolic CHF, OSA presented with one-day history of shortness of breath. The patient woke up with shortness of breath on the morning of 04/11/2016. He complains of some chest discomfort with coughing. Denies any fevers, chills, nausea, vomiting, diarrhea, abdominal pain. However, the patient stated that he has had some dyspnea on exertion for the better part of one month prior to his decompensation on 04/11/2016. Upon presentation, the patient was noted to be hypoxic with oxygen saturation of 88% on room air. Chest x-ray showed right lower lobe and left upper lobe opacity with increased interstitial markings. The patient was started on ceftriaxone and azithromycin. Nephrology was consulted, and the patient was initiated on dialysis.  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 Darrick Grinder on 7/24 -May 2017 serum creatinine 9.5 -d/c lisinopril -06/06/2015 echo EF 60-65%, grade 2 DD, no WMA  Acute on chronic diastolic CHF -I feel this is more a function of the patient's progressive renal disease -Echocardiogram -Daily weights -Accurate I's and O's -Dialysis fluid removal -Continue metoprolol succinate -Continue hydralazine and Isordil -not using ACEi due to CKD  CAP? -Check procalcitonin -Continue azithromycin and ceftriaxone  Hypertension -Continue amlodipine, metoprolol succinate and hydralazine  Anemia of CKD/iron deficiency anemia -Iron saturation 7%, ferritin  160 -Iron supplementation -Continue Aranesp per nephrology  Diabetes mellitus type 2 -Hemoglobin A1c -novolog SSI  Depression -Continue Zoloft  Hyperkalemia -improved with HD  OSA -noncompliant with CPAP at home   Disposition Plan:   Home after CLIP Family Communication:   No Family at bedside--Total time spent 35 minutes.  Greater than 50% spent face to face counseling and coordinating care.   Consultants:  Nephrology  Code Status:  FULL  DVT Prophylaxis:  SCDs   Procedures: As Listed in Progress Note Above  Antibiotics: None    Subjective: Patient says that he is bringing up iron but still remains dyspneic with exertion. Denies any fevers, chills, chest pain, nausea, vomiting, diarrhea. Denies any headache or abdominal pain. No rashes. Complains of a nonproductive cough.  Objective: Vitals:   04/12/16 1806 04/12/16 1946 04/13/16 0537 04/13/16 0812  BP: (!) 180/62 (!) 122/49 (!) 157/55 (!) 179/60  Pulse: 72 68 73 76  Resp: (!) 21 (!) 21 20 18   Temp: 98.9 F (37.2 C) 98 F (36.7 C) 99.4 F (37.4 C) 99.3 F (37.4 C)  TempSrc: Oral Oral Oral Oral  SpO2: 99% 99% 96% 98%  Weight:      Height:        Intake/Output Summary (Last 24 hours) at 04/13/16 [id number] Last data filed at 04/13/16 [id number]  Gross per 24 hour  Intake             1239 ml  Output             1900 ml  Net             -661 ml   Weight change: 3.001 kg (6 lb 9.9 oz) Exam:   General:  Pt is alert, follows commands appropriately, not in acute distress  HEENT: No icterus,  No thrush, No neck mass, [place]/AT  Cardiovascular: RRR, S1/S2, no rubs, no gallops  Respiratory: Bibasilar crackles and no wheezing. Good air movement.  Abdomen: Soft/+BS, non tender, non distended, no guarding  Extremities: 1+LE edema, No lymphangitis, No petechiae, No rashes, no synovitis   Data Reviewed: I have personally reviewed following labs and imaging studies Basic Metabolic Panel:  Recent Labs Lab  04/12/16 0002 04/13/16 0352  NA 136 135  K 5.4* 4.5  CL 110 106  CO2 14* 19*  GLUCOSE 135* 196*  BUN 81* 61*  CREATININE 9.62* 7.75*  CALCIUM 7.4* 7.6*  PHOS  --  4.9*   Liver Function Tests:  Recent Labs Lab 04/12/16 0002 04/13/16 0352  AST 18  --   ALT 13*  --   ALKPHOS 70  --   BILITOT 0.5  --   PROT 7.6  --   ALBUMIN 3.3* 2.7*   No results for input(s): LIPASE, AMYLASE in the last 168 hours. No results for input(s): AMMONIA in the last 168 hours. Coagulation Profile: No results for input(s): INR, PROTIME in the last 168 hours. CBC:  Recent Labs Lab 04/12/16 0002 04/13/16 0352  WBC 12.4* 11.3*  NEUTROABS 9.6*  --   HGB 8.1* 7.5*  HCT 25.2* 23.1*  MCV 83.4 81.6  PLT 281 258   Cardiac Enzymes: No results for input(s): CKTOTAL, CKMB, CKMBINDEX, TROPONINI in the last 168 hours. BNP: Invalid input(s): POCBNP CBG:  Recent Labs Lab 04/12/16 0759 04/12/16 1136 04/12/16 1805 04/12/16 1941 04/13/16 0741  GLUCAP 187* 277* 181* 156* 186*   HbA1C: No results for input(s): HGBA1C in the last 72 hours. Urine analysis:    Component Value Date/Time   COLORURINE YELLOW 04/12/2016 [place] 04/12/2016 2345   LABSPEC 1.016 04/12/2016 2345   PHURINE 6.5 04/12/2016 2345   GLUCOSEU 100 (A) 04/12/2016 2345   HGBUR SMALL (A) 04/12/2016 2345   BILIRUBINUR NEGATIVE 04/12/2016 2345   KETONESUR NEGATIVE 04/12/2016 2345   PROTEINUR >300 (A) 04/12/2016 2345   NITRITE NEGATIVE 04/12/2016 2345   LEUKOCYTESUR NEGATIVE 04/12/2016 2345   Sepsis Labs: @LABRCNTIP (procalcitonin:4,lacticidven:4) ) Recent Results (from the past 240 hour(s))  MRSA PCR Screening     Status: Abnormal   Collection Time: 04/12/16  6:46 AM  Result Value Ref Range Status   MRSA by PCR POSITIVE (A) NEGATIVE Final    Comment:        The GeneXpert MRSA Assay (FDA approved for NASAL specimens only), is one component of a comprehensive MRSA colonization surveillance program. It  is not intended to diagnose MRSA infection nor to guide or monitor treatment for MRSA infections. RESULT CALLED TO, READ BACK BY AND VERIFIED WITH: A. BRAKE 07.22.2017 [id number] N.MORRIS       Scheduled Meds: . albuterol  2.5 mg Nebulization QID  . amLODipine  10 mg Oral Daily  . antiseptic oral rinse  7 mL Mouth Rinse BID  . azithromycin  500 mg Intravenous Q24H  . cefTRIAXone (ROCEPHIN)  IV  1 g Intravenous Q24H  . Chlorhexidine Gluconate Cloth  6 each Topical Q0600  . darbepoetin (ARANESP) injection - DIALYSIS  100 mcg Intravenous Q Sat-HD  . fluticasone  2 spray Each Nare Daily  . hydrALAZINE  75 mg Oral Q8H  . insulin aspart  0-5 Units Subcutaneous QHS  . insulin aspart  0-9 Units Subcutaneous TID WC  . isosorbide dinitrate  30 mg Oral TID  . metoprolol  200 mg Oral Daily  . mupirocin  ointment  1 application Nasal BID  . pantoprazole  40 mg Oral Daily  . sertraline  25 mg Oral Daily  . simvastatin  20 mg Oral q1800   Continuous Infusions:   Procedures/Studies: Dg Chest 2 View  Result Date: 04/12/2016 CLINICAL DATA:  Shortness of breath beginning 1 hour ago. Dry cough for 1 week. EXAM: CHEST  2 VIEW COMPARISON:  05/25/2015 FINDINGS: Mild cardiomegaly. Small pleural effusion suggested laterally. There is perihilar opacity that is asymmetric. There is some fissural thickening but no Kerley lines or cephalized blood flow. IMPRESSION: Bilateral opacity favoring pneumonia over asymmetric edema. Followup PA and lateral chest X-ray is recommended in 3-4 weeks following therapy. Electronically Signed   By: Monte Fantasia M.D.   On: 04/12/2016 00:35    Christine Morton, DO  [facility] Pager [phone]  If 7PM-7AM, please contact night-coverage [email or web address] Password [email or web address] 04/13/2016, 9:29 AM   LOS: 1 day

## 2016-04-14 ENCOUNTER — Inpatient Hospital Stay (HOSPITAL_COMMUNITY): Payer: Non-veteran care

## 2016-04-14 DIAGNOSIS — I509 Heart failure, unspecified: Secondary | ICD-10-CM

## 2016-04-14 LAB — ECHOCARDIOGRAM COMPLETE
Ao-asc: 36 cm
CHL CUP MV DEC (S): 257
EWDT: 257 ms
FS: 44 % (ref 28–44)
Height: 68 in
IV/PV OW: 0.94
LA diam index: 2.04 cm/m2
LA vol A4C: 66.3 ml
LASIZE: 46 mm
LDCA: 2.84 cm2
LEFT ATRIUM END SYS DIAM: 46 mm
LV PW d: 17 mm — AB (ref 0.6–1.1)
LV SIMPSON'S DISK: 71
LV TDI E'LATERAL: 9.79
LV TDI E'MEDIAL: 6.53
LV dias vol index: 47 mL/m2
LV dias vol: 106 mL (ref 62–150)
LV sys vol index: 14 mL/m2
LV sys vol: 31 mL (ref 21–61)
LVELAT: 9.79 cm/s
LVOT VTI: 32.7 cm
LVOT peak grad rest: 8 mmHg
LVOTD: 19 mm
LVOTPV: 139 cm/s
LVOTSV: 93 mL
Lateral S' vel: 13.8 cm/s
MV pk E vel: 1 m/s
Stroke v: 75 ml
TAPSE: 25.9 mm
Weight: 4003.55 oz

## 2016-04-14 LAB — RENAL FUNCTION PANEL
Albumin: 3.1 g/dL — ABNORMAL LOW (ref 3.5–5.0)
Anion gap: 13 (ref 5–15)
BUN: 67 mg/dL — AB (ref 6–20)
CHLORIDE: 106 mmol/L (ref 101–111)
CO2: 18 mmol/L — AB (ref 22–32)
CREATININE: 8.7 mg/dL — AB (ref 0.61–1.24)
Calcium: 8.1 mg/dL — ABNORMAL LOW (ref 8.9–10.3)
GFR calc Af Amer: 7 mL/min — ABNORMAL LOW (ref >60)
GFR calc non Af Amer: 6 mL/min — ABNORMAL LOW (ref >60)
Glucose, Bld: 143 mg/dL — ABNORMAL HIGH (ref 65–99)
POTASSIUM: 4.3 mmol/L (ref 3.5–5.1)
Phosphorus: 5.8 mg/dL — ABNORMAL HIGH (ref 2.5–4.6)
Sodium: 137 mmol/L (ref 135–145)

## 2016-04-14 LAB — CBC
HCT: 25.4 % — ABNORMAL LOW (ref 39.0–52.0)
Hemoglobin: 8.1 g/dL — ABNORMAL LOW (ref 13.0–17.0)
MCH: 26.2 pg (ref 26.0–34.0)
MCHC: 31.9 g/dL (ref 30.0–36.0)
MCV: 82.2 fL (ref 78.0–100.0)
PLATELETS: 299 10*3/uL (ref 150–400)
RBC: 3.09 MIL/uL — ABNORMAL LOW (ref 4.22–5.81)
RDW: 16.8 % — AB (ref 11.5–15.5)
WBC: 12.7 10*3/uL — ABNORMAL HIGH (ref 4.0–10.5)

## 2016-04-14 LAB — HEMOGLOBIN A1C
HEMOGLOBIN A1C: 6.7 % — AB (ref 4.8–5.6)
MEAN PLASMA GLUCOSE: 146 mg/dL

## 2016-04-14 LAB — GLUCOSE, CAPILLARY
Glucose-Capillary: 181 mg/dL — ABNORMAL HIGH (ref 65–99)
Glucose-Capillary: 206 mg/dL — ABNORMAL HIGH (ref 65–99)
Glucose-Capillary: 192 mg/dL — ABNORMAL HIGH (ref 65–99)
Glucose-Capillary: 253 mg/dL — ABNORMAL HIGH (ref 65–99)

## 2016-04-14 LAB — LEGIONELLA PNEUMOPHILA SEROGP 1 UR AG: L. pneumophila Serogp 1 Ur Ag: NEGATIVE

## 2016-04-14 MED ORDER — HEPARIN SODIUM (PORCINE) 1000 UNIT/ML DIALYSIS: 20.0000 [IU]/kg | INTRAMUSCULAR | Status: DC | PRN

## 2016-04-14 MED ORDER — CALCITRIOL 0.25 MCG PO CAPS
0.2500 ug | ORAL_CAPSULE | Freq: Every day | ORAL | Status: DC
  Administered 2016-04-14: 0.25 ug via ORAL
  Filled 2016-04-14: qty 1

## 2016-04-14 MED ORDER — INSULIN GLARGINE 100 UNIT/ML ~~LOC~~ SOLN
5.0000 [IU] | Freq: Every day | SUBCUTANEOUS | Status: DC
  Administered 2016-04-14 – 2016-04-20 (×7): 5 [IU] via SUBCUTANEOUS
  Filled 2016-04-14 (×8): qty 0.05

## 2016-04-14 MED ORDER — METOPROLOL SUCCINATE ER 100 MG PO TB24
200.0000 mg | ORAL_TABLET | Freq: Every day | ORAL | Status: DC
  Administered 2016-04-14 – 2016-04-20 (×7): 200 mg via ORAL
  Filled 2016-04-14 (×7): qty 2

## 2016-04-14 MED ORDER — RENA-VITE PO TABS
1.0000 | ORAL_TABLET | Freq: Every day | ORAL | Status: DC
  Administered 2016-04-14 – 2016-04-20 (×7): 1 via ORAL
  Filled 2016-04-14 (×7): qty 1

## 2016-04-14 MED ORDER — AMLODIPINE BESYLATE 10 MG PO TABS
10.0000 mg | ORAL_TABLET | Freq: Every day | ORAL | Status: DC
  Administered 2016-04-14 – 2016-04-20 (×7): 10 mg via ORAL
  Filled 2016-04-14 (×7): qty 1

## 2016-04-14 NOTE — Progress Notes (Signed)
Nephrology daily progress note  Subjective: Patient was seen and examined at bedside. Reports feeling well and has no complaints. States his breathing is better.  Objective: Vital signs in last 24 hours: Vitals:   04/13/16 2116 04/14/16 0501 04/14/16 0741 04/14/16 1030  BP: (!) 178/68 (!) 161/50 (!) 156/40   Pulse: 71 77 71   Resp: 16 16 17    Temp: 98.2 F (36.8 C) 98.6 F (37 C) 98.4 F (36.9 C)   TempSrc: Oral Oral Oral   SpO2: 96% 94% 94% 95%  Weight:  250 lb 3.6 oz (113.5 kg)    Height:       Physical Exam Constitutional: He is oriented to person, place, and time. He appears well-developed and well-nourished.  Eyes: EOM are normal.  Cardiovascular: Normal rate, regular rhythm and intact distal pulses.  Exam reveals no gallop and no friction rub. No murmur heard. Gr2/6 M Pulmonary/Chest:  On supplemental O2 via [place] Bibasilar rales (improved)   Abdominal: Bowel sounds are normal. There is no tenderness. There is no guarding. Liver down 4 cm Obese  Musculoskeletal:  [name]: palpable thrill  Trace pitting edema of b/[name] LEs Neurological: He is alert and oriented to person, place, and time.  Skin: Skin is warm and dry.   Assessment/Plan: Principal Problem:   Acute respiratory failure with hypoxia (HCC) Active Problems:   OSA (obstructive sleep apnea)   Obesity (BMI 30-39.9)   Chronic kidney disease   Hypertension   Acute on chronic renal failure (HCC)   CAP (community acquired pneumonia)   Anemia   Acute pulmonary edema (HCC)   Acute respiratory failure (HCC)   Chronic diastolic heart failure (HCC)   Hyperkalemia   Acidosis   Accelerated hypertension  CKD stage V Patient presenting with progression of his CKD, uremic symptoms, and fluid overload - BUN 81, SCr 9.6, GFR 6. He was also mildly hyperkalemic (K 5.4) on admission. He underwent his first HD on 7/22 (UF 1L). Today, BP continues to be elevated (systolic in [id number]), however, lung sound better on exam and  lower extremity edema has decreased. He received a total of 320 mg IV lasix yesterday. Plan is for him to receive HD today.  -HD planned for today -F/u am renal function panel -Strict weights -Daily I/Os - making arrangements for OP dialysis  Much better with lower vol and solute  Hyperkalemia (resolved) Mildly hyperkalemic on admission with K 5.4 in the setting of CKD V. Received HD yesterday; K improved to 4.3 today.  -CTM -F/u am renal function panel   Bones Phos high at 5.8. No binder yet. -HD today -PTH pending  -Started Calcitriol 0.25 mg daily   IDA Hgb 8.1 today; baseline is approximately 9. Iron studies c/w IDA. He has already received a dose of IV Feraheme 160 mg.   -Continue Aranesp 100 mcg once a week  Acute hypoxic respiratory failure and CAP CXR is suggestive of pulmonary edema along with PNA.  -Continue supplemental O2, nebs, IV antibiotics (Rocephin and Azithromycin)  dCHF Echo from 05/2015 showing EF 60-65%. Fluid overloaded - BP continues to be elevated (systolic in [id number]), however, lung sound better on exam and lower extremity edema has decreased. -HD today  -Daily weight; strict I/Os  HTN BP continues to be elevated.  -HD today  -Continue to hold ACEi -Continue other home meds: Amlodipine, hydralazine, isosorbide dinitrate, and metoprolol  - should be able to titrate down when volume status is better with HD arrange meds with  HD and lower meds as vol off.   DM2 -Mgmt per primary team   Micheal Leff, MD 04/14/2016, 10:33 AM Pager: [phone] I have seen and examined this patient and agree with the plan of care seen, eval, examined, discussed with patient and resident.  Arranging Dispo .  Micheal [name] 04/14/2016, 12:15 PM

## 2016-04-14 NOTE — Progress Notes (Signed)
  Echocardiogram 2D Echocardiogram has been performed.  Micheal [name] 04/14/2016, 10:33 AM

## 2016-04-14 NOTE — Procedures (Signed)
I was present at this session.  I have reviewed the session itself and made appropriate changes.  HD via LLA AVF.  bp tol 3 liters off.  Slow low effic HD.  Hendrix Console L 7/24/20172:11 PM

## 2016-04-14 NOTE — Progress Notes (Signed)
Inpatient Diabetes Program Recommendations  AACE/ADA: New Consensus Statement on Inpatient Glycemic Control (2015)  Target Ranges:  Prepandial:   less than 140 mg/dL      Peak postprandial:   less than 180 mg/dL (1-2 hours)      Critically ill patients:  140 - 180 mg/dL   Results for TARIN, PATRICELLI (MRN [id number]) as of 04/14/2016 14:45  Ref. Range 04/13/2016 07:41 04/13/2016 11:51 04/13/2016 17:02 04/13/2016 21:13 04/14/2016 07:39 04/14/2016 11:33  Glucose-Capillary Latest Ref Range: 65 - 99 mg/dL 186 (H) 238 (H) 162 (H) 251 (H) 181 (H) 206 (H)  Results for MAIJOR, GINES (MRN [id number]) as of 04/14/2016 14:45  Ref. Range 04/13/2016 10:33  Hemoglobin A1C Latest Ref Range: 4.8 - 5.6 % 6.7 (H)    Review of Glycemic Control  Diabetes history: DM2 Outpatient Diabetes medications: Glucotrol 20 mg bid + NPH insulin 20 units bid Current orders for Inpatient glycemic control: Novolog correction 0-9 units tid + 0-5 units hs  Inpatient Diabetes Program Recommendations:  Noted patient not eating well consistently. Please consider Levemir 10 units bid.  Thank you, Nani Gasser. Beren Yniguez, RN, MSN, CDE Inpatient Glycemic Control Team Team Pager [phone] (8am-5pm) 04/14/2016 2:47 PM

## 2016-04-14 NOTE — Progress Notes (Addendum)
PROGRESS NOTE  Micheal [name] [id number] DOB: 10-Sep-1956 DOA: 04/11/2016 PCP: Jonathon Bellows, MD  Brief History:  60 year old male with a history of CKD stage V, diabetes mellitus, hypertension, diastolic CHF, OSA presented with one-day history of shortness of breath. The patient woke up with shortness of breath on the morning of 04/11/2016. He complains of some chest discomfort with coughing. Denies any fevers, chills, nausea, vomiting, diarrhea, abdominal pain. However, the patient stated that he has had some dyspnea on exertion for the better part of one month prior to his decompensation on 04/11/2016. Upon presentation, the patient was noted to be hypoxic with oxygen saturation of 88% on room air. Chest x-ray showed right lower lobe and left upper lobe opacity with increased interstitial markings. The patient was started on ceftriaxone and azithromycin. Nephrology was consulted, and the patient was initiated on dialysis.  Assessment/Plan: Acute respiratory failure with hypoxia -Secondary to pulmonary edema and OSA; question pneumonia -Wean oxygen for saturation greater than 92% -Continue dialysis for fluid removal -HD on 7/22 (1L) and 7/24 (3L) -Continue antibiotics for now as PCT 1.45 -repeat CXR after second HD -Pulmonary hygiene  CKD stage V-->ESRD -Appreciate nephrology follow-up -Dialysis per nephrology --HD on 7/22 (1L) and 7/24 (3L) -May 2017 serum creatinine 9.5 -d/c lisinopril -06/06/2015 echo EF 60-65%, grade 2 DD, no WMA  Acute on chronic diastolic CHF -I feel this is more a function of the patient's progressive renal disease -Echocardiogram--EF 60-65%, moderate LAE, elevated LVEDP -Daily weights--?neg 10 lbs -Accurate I's and O's -Dialysis fluid removal -Continue metoprolol succinate -Continue hydralazine and Isordil -not using ACEi due to CKD  CAP? -Check procalcitonin-->1.45 -Continue azithromycin and ceftriaxone as PCT 1.45 -repeat CXR after  fluid removal today to clarify infiltrates -am CBC  Hypertension -Continue amlodipine, metoprolol succinate and hydralazine -anticipate BP improvement with continued HD  Anemia of CKD/iron deficiency anemia -Iron saturation 7%, ferritin 160 -Iron supplementation -Continue Aranesp per nephrology  Diabetes mellitus type 2 -04/13/16--Hemoglobin A1c--6.7 -novolog SSI -7/24--start Lantus 5 units  Depression -Continue Zoloft  Hyperkalemia -improved with HD  OSA -noncompliant with CPAP at home   Disposition Plan:   Home after CLIP Family Communication:   No Family at bedside--Total time spent 35 minutes.  Greater than 50% spent face to face counseling and coordinating care.   Consultants:  Nephrology  Code Status:  FULL  DVT Prophylaxis:  SCDs   Procedures: As Listed in Progress Note Above  Antibiotics: None    Subjective: The patient is breathing better but he still has some dyspnea with moderate exertion. Denies any fevers, chills, chest pain, nausea, vomiting, diarrhea, abdominal pain. Denies any hematochezia, melena. Denies any headache, neck pain, rashes, synovitis.  Objective: Vitals:   04/14/16 1430 04/14/16 1500 04/14/16 1503 04/14/16 1604  BP: (!) 147/55 (!) 158/59 (!) 143/55 (!) 136/51  Pulse: 81  83 85  Resp: (!) 23  (!) 22 20  Temp:   97.9 F (36.6 C) 99.3 F (37.4 C)  TempSrc:    Oral  SpO2:   97% 98%  Weight:   110.8 kg (244 lb 4.3 oz)   Height:        Intake/Output Summary (Last 24 hours) at 04/14/16 1716 Last data filed at 04/14/16 1609  Gross per 24 hour  Intake              940 ml  Output  4125 ml  Net            -3185 ml   Weight change: -2.9 kg (-6 lb 6.3 oz) Exam:   General:  Pt is alert, follows commands appropriately, not in acute distress  HEENT: No icterus, No thrush, No neck mass, [place]/AT  Cardiovascular: RRR, S1/S2, no rubs, no gallops  Respiratory: Bibasilar crackles. No wheezing. Good air  movement  Abdomen: Soft/+BS, non tender, non distended, no guarding  Extremities: 1 + LE edema, No lymphangitis, No petechiae, No rashes, no synovitis   Data Reviewed: I have personally reviewed following labs and imaging studies Basic Metabolic Panel:  Recent Labs Lab 04/12/16 0002 04/13/16 0352 04/14/16 0409  NA 136 135 137  K 5.4* 4.5 4.3  CL 110 106 106  CO2 14* 19* 18*  GLUCOSE 135* 196* 143*  BUN 81* 61* 67*  CREATININE 9.62* 7.75* 8.70*  CALCIUM 7.4* 7.6* 8.1*  PHOS  --  4.9* 5.8*   Liver Function Tests:  Recent Labs Lab 04/12/16 0002 04/13/16 0352 04/14/16 0409  AST 18  --   --   ALT 13*  --   --   ALKPHOS 70  --   --   BILITOT 0.5  --   --   PROT 7.6  --   --   ALBUMIN 3.3* 2.7* 3.1*   No results for input(s): LIPASE, AMYLASE in the last 168 hours. No results for input(s): AMMONIA in the last 168 hours. Coagulation Profile: No results for input(s): INR, PROTIME in the last 168 hours. CBC:  Recent Labs Lab 04/12/16 0002 04/13/16 0352 04/14/16 0409  WBC 12.4* 11.3* 12.7*  NEUTROABS 9.6*  --   --   HGB 8.1* 7.5* 8.1*  HCT 25.2* 23.1* 25.4*  MCV 83.4 81.6 82.2  PLT 281 258 299   Cardiac Enzymes: No results for input(s): CKTOTAL, CKMB, CKMBINDEX, TROPONINI in the last 168 hours. BNP: Invalid input(s): POCBNP CBG:  Recent Labs Lab 04/13/16 1702 04/13/16 2113 04/14/16 0739 04/14/16 1133 04/14/16 1603  GLUCAP 162* 251* 181* 206* 192*   HbA1C:  Recent Labs  04/13/16 1033  HGBA1C 6.7*   Urine analysis:    Component Value Date/Time   COLORURINE YELLOW 04/12/2016 [place] 04/12/2016 2345   LABSPEC 1.016 04/12/2016 2345   PHURINE 6.5 04/12/2016 2345   GLUCOSEU 100 (A) 04/12/2016 2345   HGBUR SMALL (A) 04/12/2016 2345   BILIRUBINUR NEGATIVE 04/12/2016 2345   KETONESUR NEGATIVE 04/12/2016 2345   PROTEINUR >300 (A) 04/12/2016 2345   NITRITE NEGATIVE 04/12/2016 2345   LEUKOCYTESUR NEGATIVE 04/12/2016 2345   Sepsis  Labs: @LABRCNTIP (procalcitonin:4,lacticidven:4) ) Recent Results (from the past 240 hour(s))  MRSA PCR Screening     Status: Abnormal   Collection Time: 04/12/16  6:46 AM  Result Value Ref Range Status   MRSA by PCR POSITIVE (A) NEGATIVE Final    Comment:        The GeneXpert MRSA Assay (FDA approved for NASAL specimens only), is one component of a comprehensive MRSA colonization surveillance program. It is not intended to diagnose MRSA infection nor to guide or monitor treatment for MRSA infections. RESULT CALLED TO, READ BACK BY AND VERIFIED WITH: A. BRAKE 07.22.2017 [phone] N.MORRIS    Culture, blood (routine x 2)     Status: None (Preliminary result)   Collection Time: 04/12/16 10:26 AM  Result Value Ref Range Status   Specimen Description BLOOD RIGHT HAND  Final   Special Requests BOTTLES DRAWN AEROBIC  ONLY 5CC  Final   Culture NO GROWTH 2 DAYS  Final   Report Status PENDING  Incomplete  Culture, blood (routine x 2)     Status: None (Preliminary result)   Collection Time: 04/12/16 10:43 AM  Result Value Ref Range Status   Specimen Description BLOOD RIGHT HAND  Final   Special Requests BOTTLES DRAWN AEROBIC AND ANAEROBIC 10CC  Final   Culture NO GROWTH 2 DAYS  Final   Report Status PENDING  Incomplete     Scheduled Meds: . albuterol  2.5 mg Nebulization BID  . amLODipine  10 mg Oral QHS  . antiseptic oral rinse  7 mL Mouth Rinse BID  . azithromycin  500 mg Oral Q24H  . calcitRIOL  0.25 mcg Oral Daily  . cefTRIAXone (ROCEPHIN)  IV  1 g Intravenous Q24H  . Chlorhexidine Gluconate Cloth  6 each Topical Q0600  . darbepoetin (ARANESP) injection - DIALYSIS  100 mcg Intravenous Q Sat-HD  . fluticasone  2 spray Each Nare Daily  . furosemide  160 mg Intravenous BID  . hydrALAZINE  75 mg Oral Q8H  . insulin aspart  0-5 Units Subcutaneous QHS  . insulin aspart  0-9 Units Subcutaneous TID WC  . insulin glargine  5 Units Subcutaneous QHS  . isosorbide dinitrate  30 mg Oral TID   . metoprolol  200 mg Oral QHS  . multivitamin  1 tablet Oral QHS  . mupirocin ointment  1 application Nasal BID  . pantoprazole  40 mg Oral Daily  . sertraline  25 mg Oral Daily  . simvastatin  20 mg Oral q1800   Continuous Infusions:   Procedures/Studies: Dg Chest 2 View  Result Date: 04/12/2016 CLINICAL DATA:  Shortness of breath beginning 1 hour ago. Dry cough for 1 week. EXAM: CHEST  2 VIEW COMPARISON:  05/25/2015 FINDINGS: Mild cardiomegaly. Small pleural effusion suggested laterally. There is perihilar opacity that is asymmetric. There is some fissural thickening but no Kerley lines or cephalized blood flow. IMPRESSION: Bilateral opacity favoring pneumonia over asymmetric edema. Followup PA and lateral chest X-ray is recommended in 3-4 weeks following therapy. Electronically Signed   By: Monte Fantasia M.D.   On: 04/12/2016 00:35    Johnie Stadel, DO  [facility] Pager [phone]  If 7PM-7AM, please contact night-coverage [email or web address] Password [email or web address] 04/14/2016, 5:16 PM   LOS: 2 days

## 2016-04-15 LAB — RENAL FUNCTION PANEL
ALBUMIN: 2.8 g/dL — AB (ref 3.5–5.0)
Anion gap: 12 (ref 5–15)
BUN: 49 mg/dL — AB (ref 6–20)
CO2: 22 mmol/L (ref 22–32)
CREATININE: 7.44 mg/dL — AB (ref 0.61–1.24)
Calcium: 8.1 mg/dL — ABNORMAL LOW (ref 8.9–10.3)
Chloride: 100 mmol/L — ABNORMAL LOW (ref 101–111)
GFR calc Af Amer: 8 mL/min — ABNORMAL LOW (ref >60)
GFR calc non Af Amer: 7 mL/min — ABNORMAL LOW (ref >60)
GLUCOSE: 178 mg/dL — AB (ref 65–99)
PHOSPHORUS: 5.3 mg/dL — AB (ref 2.5–4.6)
POTASSIUM: 4.3 mmol/L (ref 3.5–5.1)
SODIUM: 134 mmol/L — AB (ref 135–145)

## 2016-04-15 LAB — PROCALCITONIN: PROCALCITONIN: 1.63 ng/mL

## 2016-04-15 LAB — CBC
HEMATOCRIT: 24.1 % — AB (ref 39.0–52.0)
Hemoglobin: 7.6 g/dL — ABNORMAL LOW (ref 13.0–17.0)
MCH: 26 pg (ref 26.0–34.0)
MCHC: 31.5 g/dL (ref 30.0–36.0)
MCV: 82.5 fL (ref 78.0–100.0)
PLATELETS: 308 10*3/uL (ref 150–400)
RBC: 2.92 MIL/uL — ABNORMAL LOW (ref 4.22–5.81)
RDW: 16.5 % — AB (ref 11.5–15.5)
WBC: 12.7 10*3/uL — ABNORMAL HIGH (ref 4.0–10.5)

## 2016-04-15 LAB — GLUCOSE, CAPILLARY
GLUCOSE-CAPILLARY: 267 mg/dL — AB (ref 65–99)
GLUCOSE-CAPILLARY: 265 mg/dL — AB (ref 65–99)
Glucose-Capillary: 190 mg/dL — ABNORMAL HIGH (ref 65–99)
Glucose-Capillary: 206 mg/dL — ABNORMAL HIGH (ref 65–99)
Glucose-Capillary: 181 mg/dL — ABNORMAL HIGH (ref 65–99)

## 2016-04-15 LAB — PARATHYROID HORMONE, INTACT (NO CA): PTH: 304 pg/mL — ABNORMAL HIGH (ref 15–65)

## 2016-04-15 MED ORDER — CALCITRIOL 1 MCG/ML IV SOLN
1.0000 ug | INTRAVENOUS | Status: DC
  Administered 2016-04-16 – 2016-04-18 (×2): 1 ug via INTRAVENOUS
  Filled 2016-04-15 (×2): qty 1

## 2016-04-15 MED ORDER — INSULIN ASPART 100 UNIT/ML ~~LOC~~ SOLN
3.0000 [IU] | Freq: Three times a day (TID) | SUBCUTANEOUS | Status: DC
  Administered 2016-04-16 – 2016-04-21 (×14): 3 [IU] via SUBCUTANEOUS

## 2016-04-15 MED ORDER — CALCIUM ACETATE (PHOS BINDER) 667 MG PO CAPS
667.0000 mg | ORAL_CAPSULE | Freq: Three times a day (TID) | ORAL | Status: DC
  Administered 2016-04-15 – 2016-04-17 (×5): 667 mg via ORAL
  Filled 2016-04-15 (×5): qty 1

## 2016-04-15 NOTE — Progress Notes (Signed)
Nephrology daily progress note  Subjective: Patient was seen and examined at bedside. Reports feeling well and has no complaints. He is breathing comfortably on room air.   Objective: Vital signs in last 24 hours: Vitals:   04/14/16 1604 04/14/16 2012 04/14/16 2038 04/15/16 0757  BP: (!) 136/51  (!) 174/54 (!) 159/59  Pulse: 85  90 74  Resp: 20  20 18   Temp: 99.3 F (37.4 C)  98.5 F (36.9 C) (!) 100.4 F (38 C)  TempSrc: Oral  Oral Oral  SpO2: 98% 98% 95% 97%  Weight:   244 lb 4.5 oz (110.8 kg)   Height:       Physical Exam Constitutional: He is oriented to person, place, and time. He appears well-developed and well-nourished.  Eyes: EOM are normal.  Cardiovascular: Normal rate, regular rhythm and intact distal pulses.  Exam reveals no gallop and no friction rub. Gr2/6 M.  Pulmonary/Chest: Mild crackles at right lung base. No increased work of breathing. Breathing comfortably on room air.    Abdominal: Obese. Bowel sounds are normal. There is no tenderness. There is no guarding. Liver down 4 cm. Musculoskeletal: No peripheral edema.  L arm AVF: palpable thrill  Neurological: He is alert and oriented to person, place, and time.  Skin: Skin is warm and dry.  Extrem 1-2+ edema  Assessment/Plan: Principal Problem:   Acute respiratory failure with hypoxia (HCC) Active Problems:   OSA (obstructive sleep apnea)   Obesity (BMI 30-39.9)   Chronic kidney disease   Hypertension   Acute on chronic renal failure (HCC)   CAP (community acquired pneumonia)   Anemia   Acute pulmonary edema (HCC)   Acute respiratory failure (HCC)   Chronic diastolic heart failure (HCC)   Hyperkalemia   Acidosis   Accelerated hypertension  CKD stage V Patient presenting with progression of his CKD, uremic symptoms, mild hyperkalemia, and fluid overload. He underwent his first HD on 7/22 (UF 1L). Second HD on 7/24 (UF 3L). BP continues to be elevated, however, lung sound better on exam and lower  extremity edema has resolved. Much better with lower vol and solute. Still vol xs -Next HD on 7/26 - goal UF 3L -F/u am renal function panel -Daily weights -Strict I/Os - making arrangements for OP dialysis   Hyperkalemia (resolved) Mildly hyperkalemic on admission; resolved with HD.   -CTM -F/u am renal function panel   Bones Phos high at 5.3. PTH high at 304.  -HD on 7/26 -Start binder: Phoslo 667 mg TID with meals  -Discontinue Calcitriol 0.25 mg daily  -Start Calcitriol 1 mcg with HD on MWF  IDA Hgb 7.6 today; baseline is approximately 9. Iron studies c/w IDA. He has already received a dose of IV Feraheme 160 mg.   -Continue Aranesp 100 mcg once a week  Acute hypoxic respiratory failure and CAP CXR is suggestive of pulmonary edema along with PNA.  -Continue supplemental O2, nebs, IV antibiotics (Rocephin and Azithromycin)  dCHF Echo from 05/2015 showing EF 60-65%. Fluid overloaded - BP continues to be elevated, however, lung sound better on exam and lower extremity edema has resolved. -HD on 7/26 with goal UF 3L to bring volume down  -Daily weight; strict I/Os  HTN BP continues to be elevated.  -HD on 7/26  -Continue to hold ACEi -Continue other home meds: Amlodipine, hydralazine, isosorbide dinitrate, and metoprolol  - arrange meds with  HD and lower meds as vol off.   DM2 -Mgmt per primary team  Obesity  Shela Leff, MD 04/15/2016, 10:26 AM Pager: [phone] I have seen and examined this patient and agree with the plan of care seen , eval, discussed HT with patient.  Discussed with resident and patient.  May want to dialyze in [place] .  Sherlock Nancarrow L 04/15/2016, 11:25 AM

## 2016-04-15 NOTE — Progress Notes (Signed)
PROGRESS NOTE  JOSIYA JUNE [id number] DOB: [birth year] DOA: 04/11/2016 PCP: Jonathon Bellows, MD  Brief History: 60 year old male with a history of CKD stage V, diabetes mellitus, hypertension, diastolic CHF, OSA presented with one-day history of shortness of breath. The patient woke up with shortness of breath on the morning of 04/11/2016. He complains of some chest discomfort with coughing. Denies any fevers, chills, nausea, vomiting, diarrhea, abdominal pain. However, the patient stated that he has had some dyspnea on exertion for the better part of one month prior to his decompensation on 04/11/2016. Upon presentation, the patient was noted to be hypoxic with oxygen saturation of 88% on room air. Chest x-ray showed right lower lobe and left upper lobe opacity with increased interstitial markings. The patient was started on ceftriaxone and azithromycin. Nephrology was consulted, and the patient was initiated on dialysis.  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 -Check  procalcitonin-->1.45-->1.63 -Continue azithromycin and ceftriaxone as PCT 1.45 -repeat CXR after second HD--improving edema but persistent LUL opacity -WBC stable but afebrile -finish abx after 7/26 doses  Hypertension -Continue amlodipine, metoprolol succinate and hydralazine -anticipate BP improvement with continued HD  Anemia of CKD/iron deficiency anemia -Iron saturation 7%, ferritin 160 -Iron supplementation -Continue Aranesp per nephrology  Diabetes mellitus type 2 -04/13/16--Hemoglobin A1c--6.7 -novolog SSI -7/24--start Lantus 5 units -add novolog 3 units with meals  Depression -Continue Zoloft  Hyperkalemia -improved with HD  OSA -noncompliant with CPAP at home   Disposition Plan: Home after CLIP Family Communication: No Family at bedside   Consultants: Nephrology  Code Status: FULL  DVT Prophylaxis: SCDs   Procedures: As Listed in Progress Note Above  Antibiotics: None  Subjective: Patient denies fevers, chills, headache, chest pain, dyspnea, nausea, vomiting, diarrhea, abdominal pain, dysuria, hematuria, hematochezia, and melena.   Objective: Vitals:   04/14/16 2012 04/14/16 2038 04/15/16 0757 04/15/16 1650  BP:  (!) 174/54 (!) 159/59 (!) 149/49  Pulse:  90 74 74  Resp:  20 18 17   Temp:  98.5 F (36.9 C) (!) 100.4 F (38 C) 99.2 F (37.3 C)  TempSrc:  Oral Oral Oral  SpO2: 98% 95% 97% 98%  Weight:  110.8 kg (244 lb 4.5 oz)    Height:        Intake/Output Summary (Last 24 hours) at 04/15/16 1903 Last data filed at 04/15/16 1829  Gross per 24 hour  Intake              720 ml  Output               50 ml  Net  670 ml   Weight change: 0.5 kg (1 lb 1.6 oz) Exam:   General:  Pt is alert, follows commands appropriately, not in acute distress  HEENT: No icterus, No thrush, No neck mass, [place]/AT  Cardiovascular: RRR, S1/S2, no rubs, no gallops  Respiratory: CTA bilaterally, no wheezing, no crackles, no  rhonchi  Abdomen: Soft/+BS, non tender, non distended, no guarding  Extremities: No edema, No lymphangitis, No petechiae, No rashes, no synovitis   Data Reviewed: I have personally reviewed following labs and imaging studies Basic Metabolic Panel:  Recent Labs Lab 04/12/16 0002 04/13/16 0352 04/14/16 0409 04/15/16 0547  NA 136 135 137 134*  K 5.4* 4.5 4.3 4.3  CL 110 106 106 100*  CO2 14* 19* 18* 22  GLUCOSE 135* 196* 143* 178*  BUN 81* 61* 67* 49*  CREATININE 9.62* 7.75* 8.70* 7.44*  CALCIUM 7.4* 7.6* 8.1* 8.1*  PHOS  --  4.9* 5.8* 5.3*   Liver Function Tests:  Recent Labs Lab 04/12/16 0002 04/13/16 0352 04/14/16 0409 04/15/16 0547  AST 18  --   --   --   ALT 13*  --   --   --   ALKPHOS 70  --   --   --   BILITOT 0.5  --   --   --   PROT 7.6  --   --   --   ALBUMIN 3.3* 2.7* 3.1* 2.8*   No results for input(s): LIPASE, AMYLASE in the last 168 hours. No results for input(s): AMMONIA in the last 168 hours. Coagulation Profile: No results for input(s): INR, PROTIME in the last 168 hours. CBC:  Recent Labs Lab 04/12/16 0002 04/13/16 0352 04/14/16 0409 04/15/16 0547  WBC 12.4* 11.3* 12.7* 12.7*  NEUTROABS 9.6*  --   --   --   HGB 8.1* 7.5* 8.1* 7.6*  HCT 25.2* 23.1* 25.4* 24.1*  MCV 83.4 81.6 82.2 82.5  PLT 281 258 299 308   Cardiac Enzymes: No results for input(s): CKTOTAL, CKMB, CKMBINDEX, TROPONINI in the last 168 hours. BNP: Invalid input(s): POCBNP CBG:  Recent Labs Lab 04/14/16 1603 04/14/16 2036 04/15/16 0752 04/15/16 1152 04/15/16 1649  GLUCAP 192* 253* 190* 206* 181*   HbA1C:  Recent Labs  04/13/16 1033  HGBA1C 6.7*   Urine analysis:    Component Value Date/Time   COLORURINE YELLOW 04/12/2016 [place] 04/12/2016 2345   LABSPEC 1.016 04/12/2016 2345   PHURINE 6.5 04/12/2016 2345   GLUCOSEU 100 (A) 04/12/2016 2345   HGBUR SMALL (A) 04/12/2016 2345   BILIRUBINUR NEGATIVE 04/12/2016 2345   KETONESUR  NEGATIVE 04/12/2016 2345   PROTEINUR >300 (A) 04/12/2016 2345   NITRITE NEGATIVE 04/12/2016 2345   LEUKOCYTESUR NEGATIVE 04/12/2016 2345   Sepsis Labs: @LABRCNTIP (procalcitonin:4,lacticidven:4) ) Recent Results (from the past 240 hour(s))  MRSA PCR Screening     Status: Abnormal   Collection Time: 04/12/16  6:46 AM  Result Value Ref Range Status   MRSA by PCR POSITIVE (A) NEGATIVE Final    Comment:        The GeneXpert MRSA Assay (FDA approved for NASAL specimens only), is one component of a comprehensive MRSA colonization surveillance program. It is not intended to diagnose MRSA infection nor to guide or monitor treatment for MRSA infections. RESULT CALLED TO, READ BACK BY AND VERIFIED WITH: A. BRAKE 07.22.2017 [id number] N.MORRIS    Culture, blood (routine x 2)     Status: None (Preliminary result)   Collection Time: 04/12/16 10:26  AM  Result Value Ref Range Status   Specimen Description BLOOD RIGHT HAND  Final   Special Requests BOTTLES DRAWN AEROBIC ONLY 5CC  Final   Culture NO GROWTH 3 DAYS  Final   Report Status PENDING  Incomplete  Culture, blood (routine x 2)     Status: None (Preliminary result)   Collection Time: 04/12/16 10:43 AM  Result Value Ref Range Status   Specimen Description BLOOD RIGHT HAND  Final   Special Requests BOTTLES DRAWN AEROBIC AND ANAEROBIC 10CC  Final   Culture NO GROWTH 3 DAYS  Final   Report Status PENDING  Incomplete     Scheduled Meds: . amLODipine  10 mg Oral QHS  . antiseptic oral rinse  7 mL Mouth Rinse BID  . azithromycin  500 mg Oral Q24H  . [START ON 04/16/2016] calcitRIOL  1 mcg Intravenous Q M,W,F-HD  . calcium acetate  667 mg Oral TID WC  . cefTRIAXone (ROCEPHIN)  IV  1 g Intravenous Q24H  . Chlorhexidine Gluconate Cloth  6 each Topical Q0600  . darbepoetin (ARANESP) injection - DIALYSIS  100 mcg Intravenous Q Sat-HD  . fluticasone  2 spray Each Nare Daily  . hydrALAZINE  75 mg Oral Q8H  . insulin aspart  0-5 Units  Subcutaneous QHS  . insulin aspart  0-9 Units Subcutaneous TID WC  . [START ON 04/16/2016] insulin aspart  3 Units Subcutaneous TID WC  . insulin glargine  5 Units Subcutaneous QHS  . isosorbide dinitrate  30 mg Oral TID  . metoprolol  200 mg Oral QHS  . multivitamin  1 tablet Oral QHS  . mupirocin ointment  1 application Nasal BID  . pantoprazole  40 mg Oral Daily  . sertraline  25 mg Oral Daily  . simvastatin  20 mg Oral q1800   Continuous Infusions:   Procedures/Studies: Dg Chest 2 View  Result Date: 04/14/2016 CLINICAL DATA:  Acute respiratory failure EXAM: CHEST  2 VIEW COMPARISON:  04/12/2016 FINDINGS: Cardiac shadow is stable but mildly enlarged. The central vascular congestion and edema seen on the recent exam has improved somewhat in the interval. Persistent elevation the right hemidiaphragm is noted. No new focal infiltrates are seen. IMPRESSION: Improved but persistent pulmonary edema. Continued follow-up is recommended. Electronically Signed   By: Inez Catalina M.D.   On: 04/14/2016 20:14  Dg Chest 2 View  Result Date: 04/12/2016 CLINICAL DATA:  Shortness of breath beginning 1 hour ago. Dry cough for 1 week. EXAM: CHEST  2 VIEW COMPARISON:  05/25/2015 FINDINGS: Mild cardiomegaly. Small pleural effusion suggested laterally. There is perihilar opacity that is asymmetric. There is some fissural thickening but no Kerley lines or cephalized blood flow. IMPRESSION: Bilateral opacity favoring pneumonia over asymmetric edema. Followup PA and lateral chest X-ray is recommended in 3-4 weeks following therapy. Electronically Signed   By: Monte Fantasia M.D.   On: 04/12/2016 00:35    Suzetta Timko, DO  [facility] Pager [phone]  If 7PM-7AM, please contact night-coverage [email or web address] Password [email or web address] 04/15/2016, 7:03 PM   LOS: 3 days

## 2016-04-15 NOTE — Progress Notes (Signed)
Inpatient Diabetes Program Recommendations  AACE/ADA: New Consensus Statement on Inpatient Glycemic Control (2015)  Target Ranges:  Prepandial:   less than 140 mg/dL      Peak postprandial:   less than 180 mg/dL (1-2 hours)      Critically ill patients:  140 - 180 mg/dL   Results for Micheal [name], Micheal [name] (MRN [id number]) as of 04/15/2016 11:09  Ref. Range 04/14/2016 07:39 04/14/2016 11:33 04/14/2016 16:03 04/14/2016 20:36 04/15/2016 07:52  Glucose-Capillary Latest Ref Range: 65 - 99 mg/dL 181 (H) 206 (H) 192 (H) 253 (H) 190 (H)    Review of Glycemic Control  Diabetes history: DM2 Outpatient Diabetes medications: Glucotrol 20 mg bid + NPH insulin 20 units bid Current orders for Inpatient glycemic control: Lantus 5 units hs+ Novolog correction 0-9 units tid + 0-5 units hs  Inpatient Diabetes Program Recommendations:  Reviewed CBGs. While NPH on hold, please consider adding meal coverage 3 units tid if eats > 50%.  Thank you, Nani Gasser. Samanthia Howland, RN, MSN, CDE Inpatient Glycemic Control Team Team Pager [phone] (8am-5pm) 04/15/2016 11:13 AM

## 2016-04-16 LAB — RENAL FUNCTION PANEL
ANION GAP: 12 (ref 5–15)
Albumin: 2.7 g/dL — ABNORMAL LOW (ref 3.5–5.0)
BUN: 61 mg/dL — ABNORMAL HIGH (ref 6–20)
CHLORIDE: 102 mmol/L (ref 101–111)
CO2: 21 mmol/L — AB (ref 22–32)
Calcium: 7.9 mg/dL — ABNORMAL LOW (ref 8.9–10.3)
Creatinine, Ser: 9.01 mg/dL — ABNORMAL HIGH (ref 0.61–1.24)
GFR calc Af Amer: 7 mL/min — ABNORMAL LOW (ref >60)
GFR calc non Af Amer: 6 mL/min — ABNORMAL LOW (ref >60)
GLUCOSE: 194 mg/dL — AB (ref 65–99)
POTASSIUM: 4.3 mmol/L (ref 3.5–5.1)
Phosphorus: 6.7 mg/dL — ABNORMAL HIGH (ref 2.5–4.6)
Sodium: 135 mmol/L (ref 135–145)

## 2016-04-16 LAB — CBC
HEMATOCRIT: 24 % — AB (ref 39.0–52.0)
HEMOGLOBIN: 7.5 g/dL — AB (ref 13.0–17.0)
MCH: 26.2 pg (ref 26.0–34.0)
MCHC: 31.3 g/dL (ref 30.0–36.0)
MCV: 83.9 fL (ref 78.0–100.0)
Platelets: 323 10*3/uL (ref 150–400)
RBC: 2.86 MIL/uL — ABNORMAL LOW (ref 4.22–5.81)
RDW: 16.4 % — ABNORMAL HIGH (ref 11.5–15.5)
WBC: 12.2 10*3/uL — ABNORMAL HIGH (ref 4.0–10.5)

## 2016-04-16 LAB — GLUCOSE, CAPILLARY
GLUCOSE-CAPILLARY: 227 mg/dL — AB (ref 65–99)
GLUCOSE-CAPILLARY: 230 mg/dL — AB (ref 65–99)
Glucose-Capillary: 166 mg/dL — ABNORMAL HIGH (ref 65–99)

## 2016-04-16 MED ORDER — HEPARIN SODIUM (PORCINE) 1000 UNIT/ML DIALYSIS
1000.0000 [IU] | INTRAMUSCULAR | Status: DC | PRN
  Administered 2016-04-16: 1000 [IU] via INTRAVENOUS_CENTRAL
  Filled 2016-04-16: qty 1

## 2016-04-16 MED ORDER — PENTAFLUOROPROP-TETRAFLUOROETH EX AERO: 1.0000 "application " | INHALATION_SPRAY | CUTANEOUS | Status: DC | PRN

## 2016-04-16 MED ORDER — LIDOCAINE-PRILOCAINE 2.5-2.5 % EX CREA: 1.0000 "application " | TOPICAL_CREAM | CUTANEOUS | Status: DC | PRN

## 2016-04-16 MED ORDER — CALCITRIOL 1 MCG/ML IV SOLN
INTRAVENOUS | Status: AC
  Filled 2016-04-16: qty 1

## 2016-04-16 MED ORDER — LIDOCAINE HCL (PF) 1 % IJ SOLN: 5.0000 mL | INTRAMUSCULAR | Status: DC | PRN

## 2016-04-16 MED ORDER — ALTEPLASE 2 MG IJ SOLR: 2.0000 mg | Freq: Once | INTRAMUSCULAR | Status: DC | PRN

## 2016-04-16 MED ORDER — HYDRALAZINE HCL 25 MG PO TABS
25.0000 mg | ORAL_TABLET | Freq: Two times a day (BID) | ORAL | Status: DC
  Administered 2016-04-16 – 2016-04-18 (×5): 25 mg via ORAL
  Filled 2016-04-16 (×5): qty 1

## 2016-04-16 MED ORDER — HEPARIN SODIUM (PORCINE) 1000 UNIT/ML DIALYSIS
100.0000 [IU]/kg | INTRAMUSCULAR | Status: DC | PRN
  Filled 2016-04-16: qty 12

## 2016-04-16 MED ORDER — SODIUM CHLORIDE 0.9 % IV SOLN: 100.0000 mL | INTRAVENOUS | Status: DC | PRN

## 2016-04-16 NOTE — Procedures (Signed)
I was present at this session.  I have reviewed the session itself and made appropriate changes.  HD via LLA AVF , unfort needle too close and both up, recirc.  Will use today,staff informed.    Yalexa Blust L 7/26/20178:32 AM

## 2016-04-16 NOTE — Progress Notes (Signed)
Nephrology daily progress note  Subjective: Patient was seen and examined at HD. Reports feeling well and has no complaints. He is breathing comfortably on room air.   Objective: Vital signs in last 24 hours: Vitals:   04/16/16 0800 04/16/16 0830 04/16/16 0900 04/16/16 0930  BP: (!) 145/69 (!) 143/71 (!) 148/71 (!) 166/64  Pulse: 72 69 71 71  Resp: (!) 21 (!) 23 19 (!) 23  Temp:      TempSrc:      SpO2:      Weight:      Height:       Physical Exam Constitutional: He is oriented to person, place, and time. He appears well-developed and well-nourished.  Eyes: EOM are normal.  Cardiovascular: Normal rate, regular rhythm and intact distal pulses.  Exam reveals no gallop and no friction rub. Gr2/6 M.  Pulmonary/Chest: Mild crackles at right lung base. No increased work of breathing. Breathing comfortably on room air.    Abdominal: Obese. Bowel sounds are normal. There is no tenderness. There is no guarding. Liver down 4 cm. Msk: L arm AVF: palpable thrill  Neurological: He is alert and oriented to person, place, and time.  Skin: Skin is warm and dry.  Extrem Trace edema  Assessment/Plan: Principal Problem:   Acute respiratory failure with hypoxia (HCC) Active Problems:   OSA (obstructive sleep apnea)   Obesity (BMI 30-39.9)   Chronic kidney disease   Hypertension   Acute on chronic renal failure (HCC)   CAP (community acquired pneumonia)   Anemia   Acute pulmonary edema (HCC)   Acute respiratory failure (HCC)   Chronic diastolic heart failure (HCC)   Hyperkalemia   Acidosis   Accelerated hypertension  CKD stage V Patient presenting with progression of his CKD, uremic symptoms, mild hyperkalemia, and fluid overload. He has undergone 2 sessions of HD with total UF 4L. BP improving with lower vol. Currently undergoing 3rd session of HD - goal UF 3L.   -F/u am renal function panel -Daily weights -Strict I/Os - making arrangements for OP dialysis in [facility]  Doing well,  using short HD to start and will ^ effic  Hyperkalemia (resolved) Mildly hyperkalemic on admission; resolved with HD.   -CTM -F/u am renal function panel   Bones Phos high at 6.7. PTH high at 304.  -HD today -Continue binder: Phoslo 667 mg TID with meals  -Continue Calcitriol 1 mcg with HD on MWF  IDA Hgb 7.5 today; baseline is approximately 9. Iron studies c/w IDA. He has already received a dose of IV Feraheme 160 mg.   -Continue Aranesp 100 mcg once a week  Acute hypoxic respiratory failure and CAP 2/2 pulmonary edema and OSA. Repeat CXR after 2nd HD showing improving edema. -Continue HD for fluid removal  -Continue supplemental O2 and IV antibiotics (Ceftriaxone and Azithromycin)  dCHF Echo from 05/2015 showing EF 60-65%.  -HD today with goal UF 3L to bring volume down  -Daily weight; strict I/Os  HTN BP improving with fluid removal via HD.   -HD today -Continue to hold ACEi -Continue other home meds: Amlodipine, hydralazine, isosorbide dinitrate, and metoprolol  - arrange meds with  HD and lower meds as vol off.   DM2 -Mgmt per primary team   Shela Leff, MD 04/16/2016, 10:11 AM Pager: [phone]  I have seen and examined this patient and agree with the plan of care seen, eval, examined, counseled patient, discussed with resident and CLIP coordinator.  Lower bp meds .  Tavarious Freel L 04/16/2016, 11:14 AM

## 2016-04-16 NOTE — Progress Notes (Addendum)
PROGRESS NOTE  GOR THRAPP [id number] DOB: Sep 11, 1956 DOA: 04/11/2016 PCP: Jonathon Bellows, MD  Brief History: 60 year old male with a history of CKD stage V, diabetes mellitus, hypertension, diastolic CHF, OSA presented with one-day history of shortness of breath. The patient woke up with shortness of breath on the morning of 04/11/2016. He complains of some chest discomfort with coughing. Denies any fevers, chills, nausea, vomiting, diarrhea, abdominal pain. However, the patient stated that he has had some dyspnea on exertion for the better part of one month prior to his decompensation on 04/11/2016. Upon presentation, the patient was noted to be hypoxic with oxygen saturation of 88% on room air. Chest x-ray showed right lower lobe and left upper lobe opacity with increased interstitial markings. The patient was started on ceftriaxone and azithromycin. Nephrology was consulted, and the patient was initiated on dialysis.  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 -Check procalcitonin-->1.45-->1.63 -Continue azithromycin and ceftriaxone as PCT 1.45 -repeat CXR after second HD--improving edema but persistent LUL opacity -WBC stable but afebrile -finish abx after 7/26 doses  Hypertension -Continue amlodipine, metoprolol succinate and hydralazine -anticipate BP improvement with continued HD  Anemia of CKD/iron deficiency anemia -Iron saturation 7%, ferritin 160 -Iron supplementation -Continue Aranesp per nephrology  Diabetes mellitus type 2 -Hemoglobin A1c--6.7 -novolog SSI, continue Lantus and sliding scale insulin and premeal NovoLog    Depression -Continue Zoloft  Hyperkalemia -improved with HD  OSA -noncompliant with CPAP at home   Disposition Plan: Home after CLIP Family Communication: No Family at bedside   Consultants: Nephrology  Code Status: FULL  DVT Prophylaxis: SCDs   Procedures: Hemodialysis  Antibiotics: None  Subjective: Subjective: Patient was seen and examined at HD. Reports feeling well and has no complaints. He is breathing comfortably on room air.   Objective: Vitals:   04/16/16 0930 04/16/16 1000 04/16/16 1026 04/16/16 1113  BP: (!) 166/64 (!) 173/70 (!) 161/69 126/62  Pulse: 71 73 73 74  Resp: (!) 23 16 (!) 23 18  Temp:   98 F (36.7 C) 98.7 F (37.1 C)  TempSrc:   Oral Oral  SpO2:    99%  Weight:   105.8 kg (233 lb 4 oz)   Height:        Intake/Output Summary (Last 24 hours) at 04/16/16 1140 Last data filed at 04/16/16 1026  Gross per 24 hour  Intake              480 ml  Output             3075 ml  Net            -  2595 ml   Weight change: -3.096 kg (-6 lb 13.2 oz) Exam:   General:  Pt is alert, follows commands appropriately, not in acute distress  HEENT: No icterus, No thrush, No neck mass, [place]/AT  Cardiovascular: RRR, S1/S2, no rubs, no gallops  Respiratory: CTA bilaterally, no wheezing, no crackles, no rhonchi  Abdomen: Soft/+BS, non  tender, non distended, no guarding  Extremities: No edema, No lymphangitis, No petechiae, No rashes, no synovitis   Data Reviewed: I have personally reviewed following labs and imaging studies Basic Metabolic Panel:  Recent Labs Lab 04/12/16 0002 04/13/16 0352 04/14/16 0409 04/15/16 0547 04/16/16 0351  NA 136 135 137 134* 135  K 5.4* 4.5 4.3 4.3 4.3  CL 110 106 106 100* 102  CO2 14* 19* 18* 22 21*  GLUCOSE 135* 196* 143* 178* 194*  BUN 81* 61* 67* 49* 61*  CREATININE 9.62* 7.75* 8.70* 7.44* 9.01*  CALCIUM 7.4* 7.6* 8.1* 8.1* 7.9*  PHOS  --  4.9* 5.8* 5.3* 6.7*   Liver Function Tests:  Recent Labs Lab 04/12/16 0002 04/13/16 0352 04/14/16 0409 04/15/16 0547 04/16/16 0351  AST 18  --   --   --   --   ALT 13*  --   --   --   --   ALKPHOS 70  --   --   --   --   BILITOT 0.5  --   --   --   --   PROT 7.6  --   --   --   --   ALBUMIN 3.3* 2.7* 3.1* 2.8* 2.7*   No results for input(s): LIPASE, AMYLASE in the last 168 hours. No results for input(s): AMMONIA in the last 168 hours. Coagulation Profile: No results for input(s): INR, PROTIME in the last 168 hours. CBC:  Recent Labs Lab 04/12/16 0002 04/13/16 0352 04/14/16 0409 04/15/16 0547 04/16/16 0351  WBC 12.4* 11.3* 12.7* 12.7* 12.2*  NEUTROABS 9.6*  --   --   --   --   HGB 8.1* 7.5* 8.1* 7.6* 7.5*  HCT 25.2* 23.1* 25.4* 24.1* 24.0*  MCV 83.4 81.6 82.2 82.5 83.9  PLT 281 258 299 308 323   Cardiac Enzymes: No results for input(s): CKTOTAL, CKMB, CKMBINDEX, TROPONINI in the last 168 hours. BNP: Invalid input(s): POCBNP CBG:  Recent Labs Lab 04/14/16 2036 04/15/16 0752 04/15/16 1152 04/15/16 1649 04/15/16 2156  GLUCAP 253* 190* 206* 181* 265*   HbA1C: No results for input(s): HGBA1C in the last 72 hours. Urine analysis:    Component Value Date/Time   COLORURINE YELLOW 04/12/2016 [place] 04/12/2016 2345   LABSPEC 1.016 04/12/2016 2345   PHURINE 6.5 04/12/2016 2345   GLUCOSEU  100 (A) 04/12/2016 2345   HGBUR SMALL (A) 04/12/2016 2345   BILIRUBINUR NEGATIVE 04/12/2016 2345   KETONESUR NEGATIVE 04/12/2016 2345   PROTEINUR >300 (A) 04/12/2016 2345   NITRITE NEGATIVE 04/12/2016 2345   LEUKOCYTESUR NEGATIVE 04/12/2016 2345   Sepsis Labs: @LABRCNTIP (procalcitonin:4,lacticidven:4) ) Recent Results (from the past 240 hour(s))  MRSA PCR Screening     Status: Abnormal   Collection Time: 04/12/16  6:46 AM  Result Value Ref Range Status   MRSA by PCR POSITIVE (A) NEGATIVE Final    Comment:        The GeneXpert MRSA Assay (FDA approved for NASAL specimens only), is one component of a comprehensive MRSA colonization surveillance program. It is not intended to diagnose MRSA infection nor to guide or monitor treatment for  MRSA infections. RESULT CALLED TO, READ BACK BY AND VERIFIED WITH: A. BRAKE 07.22.2017 [phone] N.MORRIS    Culture, blood (routine x 2)     Status: None (Preliminary result)   Collection Time: 04/12/16 10:26 AM  Result Value Ref Range Status   Specimen Description BLOOD RIGHT HAND  Final   Special Requests BOTTLES DRAWN AEROBIC ONLY 5CC  Final   Culture NO GROWTH 3 DAYS  Final   Report Status PENDING  Incomplete  Culture, blood (routine x 2)     Status: None (Preliminary result)   Collection Time: 04/12/16 10:43 AM  Result Value Ref Range Status   Specimen Description BLOOD RIGHT HAND  Final   Special Requests BOTTLES DRAWN AEROBIC AND ANAEROBIC 10CC  Final   Culture NO GROWTH 3 DAYS  Final   Report Status PENDING  Incomplete     Scheduled Meds: . amLODipine  10 mg Oral QHS  . antiseptic oral rinse  7 mL Mouth Rinse BID  . azithromycin  500 mg Oral Q24H  . calcitRIOL  1 mcg Intravenous Q M,W,F-HD  . calcium acetate  667 mg Oral TID WC  . cefTRIAXone (ROCEPHIN)  IV  1 g Intravenous Q24H  . darbepoetin (ARANESP) injection - DIALYSIS  100 mcg Intravenous Q Sat-HD  . fluticasone  2 spray Each Nare Daily  . hydrALAZINE  25 mg Oral BID  .  insulin aspart  0-5 Units Subcutaneous QHS  . insulin aspart  0-9 Units Subcutaneous TID WC  . insulin aspart  3 Units Subcutaneous TID WC  . insulin glargine  5 Units Subcutaneous QHS  . isosorbide dinitrate  30 mg Oral TID  . metoprolol  200 mg Oral QHS  . multivitamin  1 tablet Oral QHS  . mupirocin ointment  1 application Nasal BID  . pantoprazole  40 mg Oral Daily  . sertraline  25 mg Oral Daily  . simvastatin  20 mg Oral q1800   Continuous Infusions:   Procedures/Studies: Dg Chest 2 View  Result Date: 04/14/2016 CLINICAL DATA:  Acute respiratory failure EXAM: CHEST  2 VIEW COMPARISON:  04/12/2016 FINDINGS: Cardiac shadow is stable but mildly enlarged. The central vascular congestion and edema seen on the recent exam has improved somewhat in the interval. Persistent elevation the right hemidiaphragm is noted. No new focal infiltrates are seen. IMPRESSION: Improved but persistent pulmonary edema. Continued follow-up is recommended. Electronically Signed   By: Inez Catalina M.D.   On: 04/14/2016 20:14  Dg Chest 2 View  Result Date: 04/12/2016 CLINICAL DATA:  Shortness of breath beginning 1 hour ago. Dry cough for 1 week. EXAM: CHEST  2 VIEW COMPARISON:  05/25/2015 FINDINGS: Mild cardiomegaly. Small pleural effusion suggested laterally. There is perihilar opacity that is asymmetric. There is some fissural thickening but no Kerley lines or cephalized blood flow. IMPRESSION: Bilateral opacity favoring pneumonia over asymmetric edema. Followup PA and lateral chest X-ray is recommended in 3-4 weeks following therapy. Electronically Signed   By: Monte Fantasia M.D.   On: 04/12/2016 00:35    Reyne Dumas,  MD [facility] Pager [phone]  If 7PM-7AM, please contact night-coverage [email or web address] Password [email or web address] 04/16/2016, 11:40 AM   LOS: 4 days

## 2016-04-17 LAB — RENAL FUNCTION PANEL
ALBUMIN: 2.6 g/dL — AB (ref 3.5–5.0)
ANION GAP: 13 (ref 5–15)
BUN: 50 mg/dL — ABNORMAL HIGH (ref 6–20)
CALCIUM: 8 mg/dL — AB (ref 8.9–10.3)
CO2: 24 mmol/L (ref 22–32)
Chloride: 96 mmol/L — ABNORMAL LOW (ref 101–111)
Creatinine, Ser: 8.02 mg/dL — ABNORMAL HIGH (ref 0.61–1.24)
GFR, EST AFRICAN AMERICAN: 8 mL/min — AB (ref >60)
GFR, EST NON AFRICAN AMERICAN: 7 mL/min — AB (ref >60)
Glucose, Bld: 180 mg/dL — ABNORMAL HIGH (ref 65–99)
PHOSPHORUS: 6.6 mg/dL — AB (ref 2.5–4.6)
Potassium: 3.9 mmol/L (ref 3.5–5.1)
SODIUM: 133 mmol/L — AB (ref 135–145)

## 2016-04-17 LAB — CULTURE, BLOOD (ROUTINE X 2)
Culture: NO GROWTH
Culture: NO GROWTH

## 2016-04-17 LAB — GLUCOSE, CAPILLARY
GLUCOSE-CAPILLARY: 200 mg/dL — AB (ref 65–99)
GLUCOSE-CAPILLARY: 237 mg/dL — AB (ref 65–99)
Glucose-Capillary: 229 mg/dL — ABNORMAL HIGH (ref 65–99)
Glucose-Capillary: 178 mg/dL — ABNORMAL HIGH (ref 65–99)

## 2016-04-17 MED ORDER — NEPRO/CARBSTEADY PO LIQD: 237.0000 mL | Freq: Every day | ORAL | 0 refills | Status: DC | PRN

## 2016-04-17 MED ORDER — HYDRALAZINE HCL 25 MG PO TABS: 25.0000 mg | ORAL_TABLET | Freq: Three times a day (TID) | ORAL | 1 refills | Status: AC

## 2016-04-17 MED ORDER — GLIPIZIDE 5 MG PO TABS: 5.0000 mg | ORAL_TABLET | Freq: Two times a day (BID) | ORAL | 1 refills | Status: DC

## 2016-04-17 MED ORDER — CALCIUM ACETATE (PHOS BINDER) 667 MG PO CAPS
1334.0000 mg | ORAL_CAPSULE | Freq: Three times a day (TID) | ORAL | Status: DC
  Administered 2016-04-17 – 2016-04-20 (×10): 1334 mg via ORAL
  Filled 2016-04-17 (×10): qty 2

## 2016-04-17 MED ORDER — INSULIN NPH (HUMAN) (ISOPHANE) 100 UNIT/ML ~~LOC~~ SUSP: 10.0000 [IU] | Freq: Two times a day (BID) | SUBCUTANEOUS | 11 refills | Status: DC

## 2016-04-17 NOTE — Discharge Summary (Addendum)
Physician Discharge Summary  TERUO STILLEY MRN: [id number] DOB/AGE: 1956-07-22 60 y.o.  PCP: Jonathon Bellows, MD   Admit date: 04/11/2016 Discharge date: 04/17/2016  Discharge Diagnoses:    Principal Problem:   Acute respiratory failure with hypoxia ([place]) Active Problems:   OSA (obstructive sleep apnea)   Obesity (BMI 30-39.9)   Chronic kidney disease   Hypertension   Acute on chronic renal failure (HCC)   CAP (community acquired pneumonia)   Anemia   Acute pulmonary edema (HCC)   Acute respiratory failure (HCC)   Chronic diastolic heart failure (HCC)   Hyperkalemia   Acidosis   Accelerated hypertension    Follow-up recommendations Follow-up with PCP in 3-5 days , including all  additional recommended appointments as below Follow-up CBC, CMP in 3-5 days Patient to continue on outpatient hemodialysis at [place]: Patient was discharged due to inability to make outpatient dialysis arrangements    Current Discharge Medication List    START taking these medications   Details  Nutritional Supplements (FEEDING SUPPLEMENT, NEPRO CARB STEADY,) LIQD Take 237 mLs by mouth daily as needed (Offer if patient eats </=50% of meal). Qty: 30 Can, Refills: 0      CONTINUE these medications which have CHANGED   Details  hydrALAZINE (APRESOLINE) 25 MG tablet Take 1 tablet (25 mg total) by mouth 3 (three) times daily. Qty: 90 tablet, Refills: 1    insulin NPH Human (HUMULIN N,NOVOLIN N) 100 UNIT/ML injection Inject 0.1 mLs (10 Units total) into the skin 2 (two) times daily. Qty: 10 mL, Refills: 11      CONTINUE these medications which have NOT CHANGED   Details  ALPRAZolam (XANAX) 0.25 MG tablet Take 1 tablet as needed 3 times a day for panic attacks Refills: 0    amLODipine (NORVASC) 10 MG tablet Take 10 mg by mouth daily.    calcium acetate (PHOSLO) 667 MG capsule Take by mouth 3 (three) times daily with meals.    fluticasone (FLONASE) 50 MCG/ACT nasal spray  Place 2 sprays into both nostrils daily. Refills: 5    isosorbide dinitrate (ISORDIL) 30 MG tablet Take 1 tablet (30 mg total) by mouth 3 (three) times daily. Qty: 90 tablet, Refills: 11    metoprolol (TOPROL XL) 200 MG 24 hr tablet Take 1 tablet (200 mg total) by mouth daily. Qty: 30 tablet, Refills: 11    omega-3 acid ethyl esters (LOVAZA) 1 G capsule Take 1 g by mouth daily.      omeprazole (PRILOSEC) 20 MG capsule Take 20 mg by mouth daily.      sertraline (ZOLOFT) 50 MG tablet Take 0.5 tablets by mouth daily.  Refills: 0    simvastatin (ZOCOR) 20 MG tablet Take 20 mg by mouth daily.        STOP taking these medications     furosemide (LASIX) 20 MG tablet      lisinopril (PRINIVIL,ZESTRIL) 40 MG tablet      glipiZIDE (GLUCOTROL) 10 MG tablet          Discharge Condition:Stable   Discharge Instructions Get Medicines reviewed and adjusted: Please take all your medications with you for your next visit with your Primary MD  Please request your Primary MD to go over all hospital tests and procedure/radiological results at the follow up, please ask your Primary MD to get all Hospital records sent to his/her office.  If you experience worsening of your admission symptoms, develop shortness of breath, life threatening emergency,  suicidal or homicidal thoughts you must seek medical attention immediately by calling 911 or calling your MD immediately if symptoms less severe.  You must read complete instructions/literature along with all the possible adverse reactions/side effects for all the Medicines you take and that have been prescribed to you. Take any new Medicines after you have completely understood and accpet all the possible adverse reactions/side effects.   Do not drive when taking Pain medications.   Do not take more than prescribed Pain, Sleep and Anxiety Medications  Special Instructions: If you have smoked or chewed Tobacco in the last 2 yrs please stop  smoking, stop any regular Alcohol and or any Recreational drug use.  Wear Seat belts while driving.  Please note  You were cared for by a hospitalist during your hospital stay. Once you are discharged, your primary care physician will handle any further medical issues. Please note that NO REFILLS for any discharge medications will be authorized once you are discharged, as it is imperative that you return to your primary care physician (or establish a relationship with a primary care physician if you do not have one) for your aftercare needs so that they can reassess your need for medications and monitor your lab values.  Discharge Instructions    Diet - low sodium heart healthy    Complete by:  As directed   Increase activity slowly    Complete by:  As directed       No Known Allergies    Disposition: 01-Home or Self Care   Consults: Nephrology     Significant Diagnostic Studies:  Dg Chest 2 View  Result Date: 04/14/2016 CLINICAL DATA:  Acute respiratory failure EXAM: CHEST  2 VIEW COMPARISON:  04/12/2016 FINDINGS: Cardiac shadow is stable but mildly enlarged. The central vascular congestion and edema seen on the recent exam has improved somewhat in the interval. Persistent elevation the right hemidiaphragm is noted. No new focal infiltrates are seen. IMPRESSION: Improved but persistent pulmonary edema. Continued follow-up is recommended. Electronically Signed   By: Inez Catalina M.D.   On: 04/14/2016 20:14  Dg Chest 2 View  Result Date: 04/12/2016 CLINICAL DATA:  Shortness of breath beginning 1 hour ago. Dry cough for 1 week. EXAM: CHEST  2 VIEW COMPARISON:  05/25/2015 FINDINGS: Mild cardiomegaly. Small pleural effusion suggested laterally. There is perihilar opacity that is asymmetric. There is some fissural thickening but no Kerley lines or cephalized blood flow. IMPRESSION: Bilateral opacity favoring pneumonia over asymmetric edema. Followup PA and lateral chest X-ray is  recommended in 3-4 weeks following therapy. Electronically Signed   By: Monte Fantasia M.D.   On: 04/12/2016 00:35        Filed Weights   04/16/16 0654 04/16/16 1026 04/17/16 0433  Weight: 112.4 kg (247 lb 12.8 oz) 105.8 kg (233 lb 4 oz) 110.9 kg (244 lb 6.4 oz)     Microbiology: Recent Results (from the past 240 hour(s))  MRSA PCR Screening     Status: Abnormal   Collection Time: 04/12/16  6:46 AM  Result Value Ref Range Status   MRSA by PCR POSITIVE (A) NEGATIVE Final    Comment:        The GeneXpert MRSA Assay (FDA approved for NASAL specimens only), is one component of a comprehensive MRSA colonization surveillance program. It is not intended to diagnose MRSA infection nor to guide or monitor treatment for MRSA infections. RESULT CALLED TO, READ BACK BY AND VERIFIED WITH: A. BRAKE 07.22.2017 [id number] N.MORRIS  Culture, blood (routine x 2)     Status: None (Preliminary result)   Collection Time: 04/12/16 10:26 AM  Result Value Ref Range Status   Specimen Description BLOOD RIGHT HAND  Final   Special Requests BOTTLES DRAWN AEROBIC ONLY 5CC  Final   Culture NO GROWTH 4 DAYS  Final   Report Status PENDING  Incomplete  Culture, blood (routine x 2)     Status: None (Preliminary result)   Collection Time: 04/12/16 10:43 AM  Result Value Ref Range Status   Specimen Description BLOOD RIGHT HAND  Final   Special Requests BOTTLES DRAWN AEROBIC AND ANAEROBIC 10CC  Final   Culture NO GROWTH 4 DAYS  Final   Report Status PENDING  Incomplete       Blood Culture    Component Value Date/Time   SDES BLOOD RIGHT HAND 04/12/2016 1043   SPECREQUEST BOTTLES DRAWN AEROBIC AND ANAEROBIC 10CC 04/12/2016 1043   CULT NO GROWTH 4 DAYS 04/12/2016 1043   REPTSTATUS PENDING 04/12/2016 1043      Labs: Results for orders placed or performed during the hospital encounter of 04/11/16 (from the past 48 hour(s))  Glucose, capillary     Status: Abnormal   Collection Time: 04/15/16 11:52  AM  Result Value Ref Range   Glucose-Capillary 206 (H) 65 - 99 mg/dL  Glucose, capillary     Status: Abnormal   Collection Time: 04/15/16  4:49 PM  Result Value Ref Range   Glucose-Capillary 181 (H) 65 - 99 mg/dL  Glucose, capillary     Status: Abnormal   Collection Time: 04/15/16  9:56 PM  Result Value Ref Range   Glucose-Capillary 265 (H) 65 - 99 mg/dL  Renal function panel     Status: Abnormal   Collection Time: 04/16/16  3:51 AM  Result Value Ref Range   Sodium 135 135 - 145 mmol/L   Potassium 4.3 3.5 - 5.1 mmol/L   Chloride 102 101 - 111 mmol/L   CO2 21 (L) 22 - 32 mmol/L   Glucose, Bld 194 (H) 65 - 99 mg/dL   BUN 61 (H) 6 - 20 mg/dL   Creatinine, Ser 9.01 (H) 0.61 - 1.24 mg/dL   Calcium 7.9 (L) 8.9 - 10.3 mg/dL   Phosphorus 6.7 (H) 2.5 - 4.6 mg/dL   Albumin 2.7 (L) 3.5 - 5.0 g/dL   GFR calc non Af Amer 6 (L) >60 mL/min   GFR calc Af Amer 7 (L) >60 mL/min    Comment: (NOTE) The eGFR has been calculated using the CKD EPI equation. This calculation has not been validated in all clinical situations. eGFR's persistently <60 mL/min signify possible Chronic Kidney Disease.    Anion gap 12 5 - 15  CBC     Status: Abnormal   Collection Time: 04/16/16  3:51 AM  Result Value Ref Range   WBC 12.2 (H) 4.0 - 10.5 K/uL   RBC 2.86 (L) 4.22 - 5.81 MIL/uL   Hemoglobin 7.5 (L) 13.0 - 17.0 g/dL   HCT 24.0 (L) 39.0 - 52.0 %   MCV 83.9 78.0 - 100.0 fL   MCH 26.2 26.0 - 34.0 pg   MCHC 31.3 30.0 - 36.0 g/dL   RDW 16.4 (H) 11.5 - 15.5 %   Platelets 323 150 - 400 K/uL  Glucose, capillary     Status: Abnormal   Collection Time: 04/16/16 11:11 AM  Result Value Ref Range   Glucose-Capillary 166 (H) 65 - 99 mg/dL  Glucose, capillary  Status: Abnormal   Collection Time: 04/16/16  4:49 PM  Result Value Ref Range   Glucose-Capillary 227 (H) 65 - 99 mg/dL  Glucose, capillary     Status: Abnormal   Collection Time: 04/16/16 10:03 PM  Result Value Ref Range   Glucose-Capillary 230 (H) 65  - 99 mg/dL  Renal function panel     Status: Abnormal   Collection Time: 04/17/16  4:17 AM  Result Value Ref Range   Sodium 133 (L) 135 - 145 mmol/L   Potassium 3.9 3.5 - 5.1 mmol/L   Chloride 96 (L) 101 - 111 mmol/L   CO2 24 22 - 32 mmol/L   Glucose, Bld 180 (H) 65 - 99 mg/dL   BUN 50 (H) 6 - 20 mg/dL   Creatinine, Ser 8.02 (H) 0.61 - 1.24 mg/dL   Calcium 8.0 (L) 8.9 - 10.3 mg/dL   Phosphorus 6.6 (H) 2.5 - 4.6 mg/dL   Albumin 2.6 (L) 3.5 - 5.0 g/dL   GFR calc non Af Amer 7 (L) >60 mL/min   GFR calc Af Amer 8 (L) >60 mL/min    Comment: (NOTE) The eGFR has been calculated using the CKD EPI equation. This calculation has not been validated in all clinical situations. eGFR's persistently <60 mL/min signify possible Chronic Kidney Disease.    Anion gap 13 5 - 15  Glucose, capillary     Status: Abnormal   Collection Time: 04/17/16  8:19 AM  Result Value Ref Range   Glucose-Capillary 200 (H) 65 - 99 mg/dL     Lipid Panel  No results found for: CHOL, TRIG, HDL, CHOLHDL, VLDL, LDLCALC, LDLDIRECT   Lab Results  Component Value Date   HGBA1C 6.7 (H) 04/13/2016     Lab Results  Component Value Date   CREATININE 8.02 (H) 04/17/2016    60 year old male with a history of CKD stage V, diabetes mellitus, hypertension, diastolic CHF, OSA presented with one-day history of shortness of breath. The patient woke up with shortness of breath on the morning of 04/11/2016. He complains of some chest discomfort with coughing. Denies any fevers, chills, nausea, vomiting, diarrhea, abdominal pain. However, the patient stated that he has had some dyspnea on exertion for the better part of one month prior to his decompensation on 04/11/2016. Upon presentation, the patient was noted to be hypoxic with oxygen saturation of 88% on room air. Chest x-ray showed right lower lobe and left upper lobe opacity with increased interstitial markings. The patient was started on ceftriaxone and azithromycin.  Nephrology was consulted, and the patient was initiated on dialysis.  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 He has undergone 3 sessions of HD with total UF 4L.    -Daily weights -Strict I/Os - making arrangements for OP dialysis in [place]    -d/c lisinopril -06/06/2015 echo EF 60-65%, grade 2 DD, no WMA making arrangements for OP dialysis in [place]     Acute on chronic diastolic CHF secondary to volume overload secondary to ESRD -Echocardiogram--EF 60-65%, moderate LAE, elevated LVEDP -Daily weights--?neg 10 lbs -Accurate I's and O's -Dialysis fluid removal -  Continue metoprolol succinate -Continue hydralazine and Isordil ACE inhibitor and Lasix have been discontinued  CAP? -Check procalcitonin-->1.45-->1.63  azithromycin and ceftriaxone as PCT 1.45,patient received azithromycin/Rocephin from 7/22-7/26 -repeat CXR after second HD--improving edema but persistent LUL opacity -WBC stable but afebrile    Hypertension -Continue amlodipine, metoprolol succinate and hydralazine -anticipate BP improvement with continued HD  Anemia of CKD/iron deficiency anemia -Iron saturation 7%, ferritin 160 -Iron supplementation -Continue Aranesp per nephrology  Diabetes mellitus type 2 -Hemoglobin A1c--6.7, patient on glipizide however discontinued due to end-stage renal disease Patient to continue with insulin 70/30 and continue Accu-Cheks He was instructed about troponin monitoring his CBG and he voiced  understanding   Depression -Continue Zoloft  Hyperkalemia -improved with HD  OSA -noncompliant with CPAP at home    Discharge Exam:   Blood pressure (!) 123/48, pulse 77, temperature 99.1 F (37.3 C), temperature source Oral, resp. rate 17, height '5\' 8"'  (1.727 m), weight 110.9 kg (244 lb 6.4 oz), SpO2 99 %.   General:  Pt is alert, follows commands appropriately, not in acute distress  HEENT: No icterus, No thrush, No neck mass, [place]/AT  Cardiovascular: RRR, S1/S2, no rubs, no gallops  Respiratory: CTA bilaterally, no wheezing, no crackles, no rhonchi  Abdomen: Soft/+BS, non tender, non distended, no guarding  Extremities: No edema, No lymphangitis, No petechiae, No rashes, no synovitis    Follow-up Information    WEBB, CAROL D, MD. Schedule an appointment as soon as possible for a visit in 3 day(s).   Specialty:  Family Medicine Why:  hospital follow up Contact information: [place] [address] [phone]           Signed: Reyne Dumas 04/17/2016, 11:06 AM        Time spent >45 mins

## 2016-04-17 NOTE — Progress Notes (Signed)
Nephrology daily progress note  Subjective: Patient was seen and examined at bedside. Reports feeling well and has no complaints. He is breathing comfortably on room air.  Objective: Vital signs in last 24 hours: Vitals:   04/16/16 1649 04/16/16 2206 04/17/16 0433 04/17/16 0821  BP: (!) 154/65 (!) 127/48 (!) 122/53 (!) 123/48  Pulse: 74 73 71 77  Resp: 18 18 18 17   Temp: 98.9 F (37.2 C) 98.7 F (37.1 C) 99.3 F (37.4 C) 99.1 F (37.3 C)  TempSrc: Oral Oral Oral Oral  SpO2: 100% 100% 99% 99%  Weight:   244 lb 6.4 oz (110.9 kg)   Height:       Physical Exam Constitutional: He is oriented to person, place, and time. He appears well-developed and well-nourished.  Eyes: EOM are normal.  Cardiovascular: Normal rate, regular rhythm and intact distal pulses.  Exam reveals no gallop and no friction rub. Gr2/6 M.  Pulmonary/Chest: CTAB. Breathing comfortably on room air. Decreased bs Abdominal: Obese. Bowel sounds are normal. There is no tenderness. There is no guarding. Liver down 4 cm. Msk: Micheal [name]: palpable thrill  Neurological: He is alert and oriented to person, place, and time.  Skin: Skin is warm and dry.  Extrem Trace edema  Assessment/Plan: Principal Problem:   Acute respiratory failure with hypoxia (HCC) Active Problems:   OSA (obstructive sleep apnea)   Obesity (BMI 30-39.9)   Chronic kidney disease   Hypertension   Acute on chronic renal failure (HCC)   CAP (community acquired pneumonia)   Anemia   Acute pulmonary edema (HCC)   Acute respiratory failure (HCC)   Chronic diastolic heart failure (HCC)   Hyperkalemia   Acidosis   Accelerated hypertension  CKD stage V Patient presenting with progression of his CKD, uremic symptoms, mild hyperkalemia, and fluid overload. He has undergone 3 sessions of HD with total UF 7L. BP improving with lower vol.  -Plan for HD tomorrow (7/28): UF 4L, BFR 400 cc/hr   -F/u am renal function panel -Daily weights  -Strict  I/Os - making arrangements for OP dialysis in [place]    Hyperkalemia (resolved) Mildly hyperkalemic on admission; resolved with HD.   -CTM -F/u am renal function panel   Bones Phos high at 6.6. PTH high at 304.  -HD on 7/28 -Increase Phoslo to 1334 mg TID with meals  -Continue Calcitriol 1 mcg with HD on MWF  Anemia of CKD Hgb 7.5; baseline is approximately 9. Iron studies c/w IDA. He has already received a dose of IV Feraheme 160 mg.   -Continue Aranesp 100 mcg once a week  Acute hypoxic respiratory failure and CAP 2/2 pulmonary edema and OSA. Repeat CXR after 2nd HD showing improving edema. -Continue HD for fluid removal  IV antibiotics (Ceftriaxone and Azithromycin)  dCHF Echo from 05/2015 showing EF 60-65%.  -HD on 7/28 with goal UF 4L to bring volume down  -Daily weight; strict I/Os  HTN BP improving with fluid removal via HD.   -Continue to hold ACEi -Continue other home meds: Amlodipine, hydralazine, isosorbide dinitrate, and metoprolol  - arrange meds with  HD and lower meds as vol off.   DM2 -Mgmt per primary team   Micheal Leff, MD PGY2 - IMTS Pager [phone] I have seen and examined this patient and agree with the plan of care seen, examined,eval, patient counseled, discussed with resident and dialysis staff. .  Micheal Micheal [name] 04/17/2016, 4:43 PM

## 2016-04-18 LAB — COMPREHENSIVE METABOLIC PANEL
ALBUMIN: 2.8 g/dL — AB (ref 3.5–5.0)
ALK PHOS: 68 U/L (ref 38–126)
ALT: 58 U/L (ref 17–63)
ANION GAP: 16 — AB (ref 5–15)
AST: 34 U/L (ref 15–41)
BUN: 65 mg/dL — ABNORMAL HIGH (ref 6–20)
CALCIUM: 8.1 mg/dL — AB (ref 8.9–10.3)
CO2: 20 mmol/L — AB (ref 22–32)
Chloride: 98 mmol/L — ABNORMAL LOW (ref 101–111)
Creatinine, Ser: 10.17 mg/dL — ABNORMAL HIGH (ref 0.61–1.24)
GFR calc non Af Amer: 5 mL/min — ABNORMAL LOW (ref >60)
GFR, EST AFRICAN AMERICAN: 6 mL/min — AB (ref >60)
Glucose, Bld: 197 mg/dL — ABNORMAL HIGH (ref 65–99)
POTASSIUM: 4.2 mmol/L (ref 3.5–5.1)
SODIUM: 134 mmol/L — AB (ref 135–145)
Total Bilirubin: 0.5 mg/dL (ref 0.3–1.2)
Total Protein: 7.3 g/dL (ref 6.5–8.1)

## 2016-04-18 LAB — CBC
HCT: 24.2 % — ABNORMAL LOW (ref 39.0–52.0)
HEMOGLOBIN: 7.8 g/dL — AB (ref 13.0–17.0)
MCH: 26.6 pg (ref 26.0–34.0)
MCHC: 32.2 g/dL (ref 30.0–36.0)
MCV: 82.6 fL (ref 78.0–100.0)
PLATELETS: 389 10*3/uL (ref 150–400)
RBC: 2.93 MIL/uL — ABNORMAL LOW (ref 4.22–5.81)
RDW: 16 % — AB (ref 11.5–15.5)
WBC: 10.3 10*3/uL (ref 4.0–10.5)

## 2016-04-18 LAB — GLUCOSE, CAPILLARY
GLUCOSE-CAPILLARY: 152 mg/dL — AB (ref 65–99)
GLUCOSE-CAPILLARY: 204 mg/dL — AB (ref 65–99)
GLUCOSE-CAPILLARY: 204 mg/dL — AB (ref 65–99)

## 2016-04-18 LAB — PHOSPHORUS: Phosphorus: 8.4 mg/dL — ABNORMAL HIGH (ref 2.5–4.6)

## 2016-04-18 MED ORDER — DARBEPOETIN ALFA 100 MCG/0.5ML IJ SOSY
100.0000 ug | PREFILLED_SYRINGE | INTRAMUSCULAR | Status: DC
  Administered 2016-04-21: 100 ug via INTRAVENOUS
  Filled 2016-04-18: qty 0.5

## 2016-04-18 MED ORDER — CALCITRIOL 1 MCG/ML IV SOLN
INTRAVENOUS | Status: AC
Start: 2016-04-18 — End: 2016-04-18
  Administered 2016-04-18: 1 ug via INTRAVENOUS
  Filled 2016-04-18: qty 1

## 2016-04-18 MED ORDER — SODIUM CHLORIDE 0.9 % IV BOLUS (SEPSIS)
250.0000 mL | Freq: Once | INTRAVENOUS | Status: AC
  Administered 2016-04-18: 250 mL via INTRAVENOUS

## 2016-04-18 MED ORDER — NEPRO/CARBSTEADY PO LIQD
237.0000 mL | Freq: Every day | ORAL | Status: DC
Start: 2016-04-18 — End: 2016-04-21
  Administered 2016-04-18 – 2016-04-20 (×3): 237 mL via ORAL

## 2016-04-18 NOTE — Progress Notes (Signed)
Nephrology daily progress note  Subjective: Patient was seen and examined at HD. Reports feeling well and has no complaints.  Objective: Vital signs in last 24 hours: Vitals:   04/18/16 1100 04/18/16 1119 04/18/16 1130 04/18/16 1148  BP: (!) 117/59 (!) 111/58 136/60 (!) 114/58  Pulse: 65 71 73 70  Resp:  (!) 21 20 19   Temp:    98 F (36.7 C)  TempSrc:      SpO2:    97%  Weight:      Height:       Physical Exam Constitutional: He is oriented to person, place, and time. He appears well-developed and well-nourished.  Eyes: EOM are normal.  Cardiovascular: Normal rate, regular rhythm and intact distal pulses.  Exam reveals no gallop and no friction rub. Gr2/6 M.  Pulmonary/Chest: CTAB. Breathing comfortably on room air. Decreased bs Abdominal: Obese. Bowel sounds are normal. There is no tenderness. There is no guarding. Liver down 4 cm. Msk: L arm AVF: palpable thrill  Neurological: He is alert and oriented to person, place, and time.  Skin: Skin is warm and dry.  Extrem Trace edema  Assessment/Plan: Principal Problem:   Acute respiratory failure with hypoxia (HCC) Active Problems:   OSA (obstructive sleep apnea)   Obesity (BMI 30-39.9)   Chronic kidney disease   Hypertension   Acute on chronic renal failure (HCC)   CAP (community acquired pneumonia)   Anemia   Acute pulmonary edema (HCC)   Acute respiratory failure (HCC)   Chronic diastolic heart failure (HCC)   Hyperkalemia   Acidosis   Accelerated hypertension  CKD stage V Patient presenting with progression of his CKD, uremic symptoms, mild hyperkalemia, and fluid overload. Dialysis was initiated and BP improved with lower vol. Uremic symptoms have resolved. Potassium is normal. Continues to be volume overloaded.  -HD this am: UF 4L, BFR 300 cc/min   -F/u am renal function panel -Daily weights  -Strict I/Os - making arrangements for OP dialysis in [facility]  Awaiting return call from [facility]  Hyperkalemia  (resolved) Mildly hyperkalemic on admission; resolved with HD.   -CTM -F/u am renal function panel   Bones Phos high at 6.6. PTH high at 304.  -HD this am -Phoslo to 1334 mg TID with meals  -Calcitriol 1 mcg with HD on MWF  Anemia of CKD Hgb 7.8; baseline is approximately 9. Iron studies c/w IDA. He has already received a dose of IV Feraheme 160 mg.   -Continue Aranesp 100 mcg once a week  Acute hypoxic respiratory failure and CAP 2/2 pulmonary edema and OSA. Repeat CXR after 2nd HD showing improving edema. -Continue HD for fluid removal  -Finished course of antibiotics   dCHF Echo from 05/2015 showing EF 60-65%.  -HD today -Daily weight; strict I/Os  HTN BP improving with fluid removal via HD.   -Continue to hold ACEi -Continue other home meds: Amlodipine, hydralazine, isosorbide dinitrate, and metoprolol  - arrange meds with  HD and lower meds as vol off.   DM2 -Mgmt per primary team   Shela Leff, MD PGY2 - IMTS Pager [phone] I have seen and examined this patient and agree with the plan of care seen, eval, examined. Discussed with dialysis staff, and resident. .  Hudson Majkowski L 04/18/2016, 12:15 PM

## 2016-04-18 NOTE — Progress Notes (Signed)
Hemodialysis- Treatment completed. Unable to meet goal d/t low bp x2. Total UF 2.2L. Pt post HD standing weight 108kg. Report given to primary RN on 6E. Pt has no complaints. Tolerated well.

## 2016-04-18 NOTE — Progress Notes (Signed)
Nutrition Follow-up  DOCUMENTATION CODES:   Obesity unspecified  INTERVENTION:  Provide Nepro Shake po once daily, each supplement provides 425 kcal and 19 grams protein.  Encourage adequate PO intake.   NUTRITION DIAGNOSIS:   Unintentional weight loss related to poor appetite as evidenced by per patient/family report; ongoing  GOAL:   Patient will meet greater than or equal to 90% of their needs; met  MONITOR:   PO intake, Supplement acceptance, Labs, I & O's, Weight trends  REASON FOR ASSESSMENT:   Malnutrition Screening Tool    ASSESSMENT:   60 yo M with a PMHx of CKD stage 5, DM2, HTN, dCHF (EF 60-65% in 05/2015), OSA, severe obesity presenting with a 1 day history of acute onset SOB thought to be secondary to acute respiratory failure with hypoxia and acute pulmonary edema in the setting of worsening renal function; also possible CAP.  HD initiated.   Meal completion has been 100%. RD to modify orders and provide Nepro Shake once daily to aid in caloric and protein needs. Labs and medications reviewed. Phosphorous elevated at 8.4.  Diet Order:  Diet renal/carb modified with fluid restriction Diet-HS Snack?: Nothing; Room service appropriate?: Yes; Fluid consistency:: Thin Diet - low sodium heart healthy  Skin:  Reviewed, no issues  Last BM:  7/27  Height:   Ht Readings from Last 1 Encounters:  04/12/16 _0  (1.727 m)    Weight:   Wt Readings from Last 1 Encounters:  04/18/16 238 lb 1.6 oz (108 kg)    Ideal Body Weight:  70 kg  BMI:  Body mass index is 36.2 kg/m.  Estimated Nutritional Needs:   Kcal:  2100-2300  Protein:  100-115 grams  Fluid:  per MD  EDUCATION NEEDS:   No education needs identified at this time  Corrin Parker, MS, RD, LDN Pager # [phone] After hours/ weekend pager # [phone]

## 2016-04-18 NOTE — Procedures (Signed)
I was present at this session.  I have reviewed the session itself and made appropriate changes.   bp in 130s, removing 4 L.  Flow 300, small needles.   Micheal [name] L 7/28/20178:34 AM

## 2016-04-18 NOTE — Progress Notes (Addendum)
Patient ID: Micheal [name], male   DOB: April 20, 1956, 60 y.o.   MRN: [id number]                                                                PROGRESS NOTE                                                                                                                                                                                                             Patient Demographics:    Micheal [name], is a 60 y.o. male, DOB - 1956-07-03, [id number]  Admit date - 04/11/2016   Admitting Physician Vianne Bulls, MD  Outpatient Primary MD for the patient is WEBB, Valla Leaver, MD  LOS - 6  Outpatient Specialists:    Chief Complaint  Patient presents with  . Shortness of Breath       Brief Narrative  60 year old male with a history of CKD stage V, diabetes mellitus, hypertension, diastolic CHF, OSA presented with one-day history of shortness of breath. The patient woke up with shortness of breath on the morning of 04/11/2016. He complains of some chest discomfort with coughing. Denies any fevers, chills, nausea, vomiting, diarrhea, abdominal pain. However, the patient stated that he has had some dyspnea on exertion for the better part of one month prior to his decompensation on 04/11/2016. Upon presentation, the patient was noted to be hypoxic with oxygen saturation of 88% on room air. Chest x-ray showed right lower lobe and left upper lobe opacity with increased interstitial markings. The patient was started on ceftriaxone and azithromycin. Nephrology was consulted, and the patient was initiated on dialysis.    Subjective:    Micheal [name] today has been to dialysis, slightly hypotensive after dialysis but asymptomatic. , No headache, No chest pain, No abdominal pain - No Nausea, No new weakness tingling or numbness, No Cough - SOB.    Assessment  & Plan :    Principal Problem:   Acute respiratory failure with hypoxia (HCC) Active Problems:   OSA (obstructive sleep apnea)   Obesity (BMI  30-39.9)   Chronic kidney disease   Hypertension   Acute on chronic renal failure (HCC)   CAP (community acquired pneumonia)   Anemia   Acute pulmonary edema (HCC)   Acute respiratory failure (HCC)  Chronic diastolic heart failure (HCC)   Hyperkalemia   Acidosis   Accelerated hypertension   Hypotension Secondary to dialysis Ns 266mL iv x1.   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 Currently on HD -Daily weights -Strict I/Os - making arrangements for OP dialysis in [place]  awaiting CLIP -off lisinopril  Acute respiratory failure with hypoxia -Secondary to pulmonary edema and OSA; question pneumonia Currently in room air -Continue dialysis for fluid removal patient received azithromycin/Rocephin from 7/22-7/26 -repeat CXR after second HD--improving edema but persistent LUL opacity   Acute on chronic diastolic CHFsecondary to volume overload secondary to ESRD -Echocardiogram--EF 60-65%, moderate LAE, elevated LVEDP -06/06/2015 echo EF 60-65%, grade 2 DD, no WMA -Accurate I's and O's -Dialysis fluid removal -Continue metoprolol succinate -Continue hydralazine and Isordil ACE inhibitor and Lasix have been discontinued  CAP? -Check procalcitonin-->1.45-->1.63  azithromycin and ceftriaxone as PCT 1.45,patient received azithromycin/Rocephin from 7/22-7/26 -repeat CXR after second HD--improving edema but persistent LUL opacity -WBC stable but afebrile    Hypertension -Continue amlodipine, metoprolol succinate and hydralazine -anticipate BP improvement with continued HD  Anemia of CKD/iron deficiency anemia -Iron saturation 7%, ferritin 160 -Iron supplementation -Continue Aranesp per nephrology  Diabetes mellitus type 2 -Hemoglobin A1c--6.7, patient on glipizide however discontinued due to end-stage renal disease Patient to continue with insulin 70/30 and continue  Accu-Cheks He was instructed about troponin monitoring his CBG and he voiced understanding   Depression -Continue Zoloft  Hyperkalemia -improved with HD  OSA -noncompliant with CPAP at home     Code Status : FULL CODE  Family Communication  :   Disposition Plan  :  Home  Barriers For Discharge :   Dialysis arrangements  Consults  :  nephrology  Procedures  : HD  DVT Prophylaxis  :  Heparin - SCDs   Lab Results  Component Value Date   PLT 389 04/18/2016    Antibiotics  :  See below  Anti-infectives    Start     Dose/Rate Route Frequency Ordered Stop   04/13/16 2200  azithromycin (ZITHROMAX) tablet 500 mg     500 mg Oral Every 24 hours 04/13/16 1025 04/16/16 2245   04/12/16 2300  azithromycin (ZITHROMAX) 500 mg in dextrose 5 % 250 mL IVPB  Status:  Discontinued     500 mg 250 mL/hr over 60 Minutes Intravenous Every 24 hours 04/12/16 0840 04/13/16 1025   04/12/16 2300  cefTRIAXone (ROCEPHIN) 1 g in dextrose 5 % 50 mL IVPB     1 g 100 mL/hr over 30 Minutes Intravenous Every 24 hours 04/12/16 0840 04/16/16 2317   04/12/16 0254  azithromycin (ZITHROMAX) 500 MG injection    Comments:  Micheal [name]   : cabinet override      04/12/16 0254 04/12/16 1459   04/12/16 0245  cefTRIAXone (ROCEPHIN) 1 g in dextrose 5 % 50 mL IVPB     1 g 100 mL/hr over 30 Minutes Intravenous  Once 04/12/16 0243 04/12/16 0405   04/12/16 0245  azithromycin (ZITHROMAX) 500 mg in dextrose 5 % 250 mL IVPB     500 mg 250 mL/hr over 60 Minutes Intravenous  Once 04/12/16 0243 04/12/16 0402        Objective:   Vitals:   04/18/16 1148 04/18/16 1154 04/18/16 1323 04/18/16 1403  BP: (!) 114/58 (!) 131/59 (!) 89/45 (!) 88/28  Pulse: 70 68 70 75  Resp: 19 16    Temp:  98 F (36.7 C)     TempSrc:      SpO2: 97%     Weight: 108 kg (238 lb 1.6 oz)     Height:        Wt Readings from Last 3 Encounters:  04/18/16 108 kg (238 lb 1.6 oz)  08/02/15 119.7 kg (264 lb)  06/27/15 117 kg (258  lb)     Intake/Output Summary (Last 24 hours) at 04/18/16 1427 Last data filed at 04/18/16 1148  Gross per 24 hour  Intake              360 ml  Output             2473 ml  Net            -2113 ml     Physical Exam  Awake Alert, Oriented X 3, No new F.N deficits, Normal affect [place].AT,PERRAL Supple Neck,No JVD, No cervical lymphadenopathy appriciated.  Symmetrical Chest wall movement, Good air movement bilaterally, CTAB RRR,No Gallops,Rubs or new Murmurs, No Parasternal Heave +ve B.Sounds, Abd Soft, No tenderness, No organomegaly appriciated, No rebound - guarding or rigidity. No Cyanosis, Clubbing or edema, No new Rash or bruise      Data Review:    CBC  Recent Labs Lab 04/12/16 0002 04/13/16 0352 04/14/16 0409 04/15/16 0547 04/16/16 0351 04/18/16 0745  WBC 12.4* 11.3* 12.7* 12.7* 12.2* 10.3  HGB 8.1* 7.5* 8.1* 7.6* 7.5* 7.8*  HCT 25.2* 23.1* 25.4* 24.1* 24.0* 24.2*  PLT 281 258 299 308 323 389  MCV 83.4 81.6 82.2 82.5 83.9 82.6  MCH 26.8 26.5 26.2 26.0 26.2 26.6  MCHC 32.1 32.5 31.9 31.5 31.3 32.2  RDW 16.1* 16.4* 16.8* 16.5* 16.4* 16.0*  LYMPHSABS 1.8  --   --   --   --   --   MONOABS 0.8  --   --   --   --   --   EOSABS 0.2  --   --   --   --   --   BASOSABS 0.1  --   --   --   --   --     Chemistries   Recent Labs Lab 04/12/16 0002  04/14/16 0409 04/15/16 0547 04/16/16 0351 04/17/16 0417 04/18/16 0745  NA 136  < > 137 134* 135 133* 134*  K 5.4*  < > 4.3 4.3 4.3 3.9 4.2  CL 110  < > 106 100* 102 96* 98*  CO2 14*  < > 18* 22 21* 24 20*  GLUCOSE 135*  < > 143* 178* 194* 180* 197*  BUN 81*  < > 67* 49* 61* 50* 65*  CREATININE 9.62*  < > 8.70* 7.44* 9.01* 8.02* 10.17*  CALCIUM 7.4*  < > 8.1* 8.1* 7.9* 8.0* 8.1*  AST 18  --   --   --   --   --  34  ALT 13*  --   --   --   --   --  58  ALKPHOS 70  --   --   --   --   --  68  BILITOT 0.5  --   --   --   --   --  0.5  < > = values in this interval not  displayed. ------------------------------------------------------------------------------------------------------------------ No results for input(s): CHOL, HDL, LDLCALC, TRIG, CHOLHDL, LDLDIRECT in the last 72 hours.  Lab Results  Component Value Date   HGBA1C 6.7 (H) 04/13/2016   ------------------------------------------------------------------------------------------------------------------ No results  for input(s): TSH, T4TOTAL, T3FREE, THYROIDAB in the last 72 hours.  Invalid input(s): FREET3 ------------------------------------------------------------------------------------------------------------------ No results for input(s): VITAMINB12, FOLATE, FERRITIN, TIBC, IRON, RETICCTPCT in the last 72 hours.  Coagulation profile No results for input(s): INR, PROTIME in the last 168 hours.  No results for input(s): DDIMER in the last 72 hours.  Cardiac Enzymes No results for input(s): CKMB, TROPONINI, MYOGLOBIN in the last 168 hours.  Invalid input(s): CK ------------------------------------------------------------------------------------------------------------------    Component Value Date/Time   BNP 140.1 (H) 04/12/2016 0002    Inpatient Medications  Scheduled Meds: . amLODipine  10 mg Oral QHS  . antiseptic oral rinse  7 mL Mouth Rinse BID  . calcitRIOL  1 mcg Intravenous Q M,W,F-HD  . calcium acetate  1,334 mg Oral TID WC  . [START ON 04/21/2016] darbepoetin (ARANESP) injection - DIALYSIS  100 mcg Intravenous Q Mon-HD  . fluticasone  2 spray Each Nare Daily  . hydrALAZINE  25 mg Oral BID  . insulin aspart  0-5 Units Subcutaneous QHS  . insulin aspart  0-9 Units Subcutaneous TID WC  . insulin aspart  3 Units Subcutaneous TID WC  . insulin glargine  5 Units Subcutaneous QHS  . isosorbide dinitrate  30 mg Oral TID  . metoprolol  200 mg Oral QHS  . multivitamin  1 tablet Oral QHS  . pantoprazole  40 mg Oral Daily  . sertraline  25 mg Oral Daily  . simvastatin  20 mg  Oral q1800   Continuous Infusions:  PRN Meds:.acetaminophen **OR** acetaminophen, albuterol, ALPRAZolam, bisacodyl, feeding supplement (NEPRO CARB STEADY), hydrALAZINE, morphine injection, ondansetron **OR** ondansetron (ZOFRAN) IV, phenol  Micro Results Recent Results (from the past 240 hour(s))  MRSA PCR Screening     Status: Abnormal   Collection Time: 04/12/16  6:46 AM  Result Value Ref Range Status   MRSA by PCR POSITIVE (A) NEGATIVE Final    Comment:        The GeneXpert MRSA Assay (FDA approved for NASAL specimens only), is one component of a comprehensive MRSA colonization surveillance program. It is not intended to diagnose MRSA infection nor to guide or monitor treatment for MRSA infections. RESULT CALLED TO, READ BACK BY AND VERIFIED WITH: A. BRAKE 07.22.2017 0[address]    Culture, blood (routine x 2)     Status: None   Collection Time: 04/12/16 10:26 AM  Result Value Ref Range Status   Specimen Description BLOOD RIGHT HAND  Final   Special Requests BOTTLES DRAWN AEROBIC ONLY 5CC  Final   Culture NO GROWTH 5 DAYS  Final   Report Status 04/17/2016 FINAL  Final  Culture, blood (routine x 2)     Status: None   Collection Time: 04/12/16 10:43 AM  Result Value Ref Range Status   Specimen Description BLOOD RIGHT HAND  Final   Special Requests BOTTLES DRAWN AEROBIC AND ANAEROBIC 10CC  Final   Culture NO GROWTH 5 DAYS  Final   Report Status 04/17/2016 FINAL  Final    Radiology Reports Dg Chest 2 View  Result Date: 04/14/2016 CLINICAL DATA:  Acute respiratory failure EXAM: CHEST  2 VIEW COMPARISON:  04/12/2016 FINDINGS: Cardiac shadow is stable but mildly enlarged. The central vascular congestion and edema seen on the recent exam has improved somewhat in the interval. Persistent elevation the right hemidiaphragm is noted. No new focal infiltrates are seen. IMPRESSION: Improved but persistent pulmonary edema. Continued follow-up is recommended. Electronically Signed    By: Linus Mako.D.  On: 04/14/2016 20:14  Dg Chest 2 View  Result Date: 04/12/2016 CLINICAL DATA:  Shortness of breath beginning 1 hour ago. Dry cough for 1 week. EXAM: CHEST  2 VIEW COMPARISON:  05/25/2015 FINDINGS: Mild cardiomegaly. Small pleural effusion suggested laterally. There is perihilar opacity that is asymmetric. There is some fissural thickening but no Kerley lines or cephalized blood flow. IMPRESSION: Bilateral opacity favoring pneumonia over asymmetric edema. Followup PA and lateral chest X-ray is recommended in 3-4 weeks following therapy. Electronically Signed   By: Monte Fantasia M.D.   On: 04/12/2016 00:35    Time Spent in minutes  30   Jani Gravel M.D on 04/18/2016 at 2:27 PM  Between 7am to 7pm - Pager - [phone]  After 7pm go to [email or web address] - password [facility]  [facility] -  Office  [phone]

## 2016-04-18 NOTE — Progress Notes (Signed)
Pt hypotensive; BP 88/28; Pt states he feels dizzy. MD made aware. New orders placed. Will continue to monitor pt.   Carole Civil, RN, [facility] [address] Phone [phone]

## 2016-04-19 DIAGNOSIS — N186 End stage renal disease: Secondary | ICD-10-CM

## 2016-04-19 DIAGNOSIS — Z992 Dependence on renal dialysis: Secondary | ICD-10-CM

## 2016-04-19 LAB — RENAL FUNCTION PANEL
ALBUMIN: 2.7 g/dL — AB (ref 3.5–5.0)
ANION GAP: 13 (ref 5–15)
BUN: 40 mg/dL — ABNORMAL HIGH (ref 6–20)
CALCIUM: 8.1 mg/dL — AB (ref 8.9–10.3)
CO2: 24 mmol/L (ref 22–32)
Chloride: 94 mmol/L — ABNORMAL LOW (ref 101–111)
Creatinine, Ser: 7.2 mg/dL — ABNORMAL HIGH (ref 0.61–1.24)
GFR calc non Af Amer: 8 mL/min — ABNORMAL LOW (ref >60)
GFR, EST AFRICAN AMERICAN: 9 mL/min — AB (ref >60)
GLUCOSE: 215 mg/dL — AB (ref 65–99)
PHOSPHORUS: 6.1 mg/dL — AB (ref 2.5–4.6)
POTASSIUM: 4 mmol/L (ref 3.5–5.1)
SODIUM: 131 mmol/L — AB (ref 135–145)

## 2016-04-19 LAB — CBC
HEMATOCRIT: 24.7 % — AB (ref 39.0–52.0)
HEMOGLOBIN: 7.8 g/dL — AB (ref 13.0–17.0)
MCH: 26.6 pg (ref 26.0–34.0)
MCHC: 31.6 g/dL (ref 30.0–36.0)
MCV: 84.3 fL (ref 78.0–100.0)
Platelets: 369 10*3/uL (ref 150–400)
RBC: 2.93 MIL/uL — AB (ref 4.22–5.81)
RDW: 16 % — ABNORMAL HIGH (ref 11.5–15.5)
WBC: 11 10*3/uL — ABNORMAL HIGH (ref 4.0–10.5)

## 2016-04-19 LAB — GLUCOSE, CAPILLARY
GLUCOSE-CAPILLARY: 197 mg/dL — AB (ref 65–99)
GLUCOSE-CAPILLARY: 257 mg/dL — AB (ref 65–99)
GLUCOSE-CAPILLARY: 217 mg/dL — AB (ref 65–99)
Glucose-Capillary: 189 mg/dL — ABNORMAL HIGH (ref 65–99)

## 2016-04-19 NOTE — Progress Notes (Signed)
Subjective: Interval History: has no complaint .  Objective: Vital signs in last 24 hours: Temp:  [98 F (36.7 C)-99.2 F (37.3 C)] 99.2 F (37.3 C) (07/29 0459) Pulse Rate:  [62-75] 63 (07/29 0459) Resp:  [15-21] 19 (07/29 0459) BP: (84-136)/(28-60) 131/48 (07/29 0459) SpO2:  [97 %-100 %] 98 % (07/29 0459) Weight:  [108 kg (238 lb 1.6 oz)-108.5 kg (239 lb 3.2 oz)] 108.5 kg (239 lb 3.2 oz) (07/28 2045) Weight change: 0.4 kg (14.1 oz)  Intake/Output from previous day: 07/28 0701 - 07/29 0700 In: 360 [P.O.:360] Out: 2473 [Urine:250] Intake/Output this shift: No intake/output data recorded.  General appearance: alert, cooperative and moderately obese Resp: clear to auscultation bilaterally Cardio: S1, S2 normal and systolic murmur: holosystolic 2/6, blowing at apex GI: obese, pos bs, soft Extremities: avf LLA B&T  Lab Results:  Recent Labs  04/18/16 0745 04/19/16 0537  WBC 10.3 11.0*  HGB 7.8* 7.8*  HCT 24.2* 24.7*  PLT 389 369   BMET:  Recent Labs  04/18/16 0745 04/19/16 0537  NA 134* 131*  K 4.2 4.0  CL 98* 94*  CO2 20* 24  GLUCOSE 197* 215*  BUN 65* 40*  CREATININE 10.17* 7.20*  CALCIUM 8.1* 8.1*   No results for input(s): PTH in the last 72 hours. Iron Studies: No results for input(s): IRON, TIBC, TRANSFERRIN, FERRITIN in the last 72 hours.  Studies/Results: No results found.  I have reviewed the patient's current medications.  Assessment/Plan: 1 ESRD vol better , more effic HD so solute better.  bp improving with vol control. Clinically not uremic anymore 2 HTN lower, stop hydral 3 Anemia on ESA, got Fe 4 HPTH vit D 5 DM controlled 6 Obesity.  P awaiting assign to [facility], [facility],  Stop hydral, HD on Mon    LOS: 7 days   Micheal [name] 04/19/2016,9:19 AM

## 2016-04-19 NOTE — Progress Notes (Signed)
Patient ID: Micheal [name], male   DOB: [birth year], 58 y.o.   MRN: [id number]                                                                PROGRESS NOTE                                                                                                                                                                                                             Patient Demographics:    Micheal [name], is a 60 y.o. male, DOB - 18-Dec-1955, [id number]  Admit date - 04/11/2016   Admitting Physician Vianne Bulls, MD  Outpatient Primary MD for the patient is WEBB, Valla Leaver, MD  LOS - 7  Outpatient Specialists:   Chief Complaint  Patient presents with  . Shortness of Breath       Brief Narrative  60 year old male with a history of CKD stage V, diabetes mellitus, hypertension, diastolic CHF, OSA presented with one-day history of shortness of breath. The patient woke up with shortness of breath on the morning of 04/11/2016. He complains of some chest discomfort with coughing. Denies any fevers, chills, nausea, vomiting, diarrhea, abdominal pain. However, the patient stated that he has had some dyspnea on exertion for the better part of one month prior to his decompensation on 04/11/2016. Upon presentation, the patient was noted to be hypoxic with oxygen saturation of 88% on room air. Chest x-ray showed right lower lobe and left upper lobe opacity with increased interstitial markings. The patient was started on ceftriaxone and azithromycin. Nephrology was consulted, and the patient was initiated on dialysis.    Subjective:    Micheal [name] today has, No headache, No chest pain, No abdominal pain - No Nausea, No new weakness tingling or numbness, No Cough - SOB.  Awaiting CLIP   Assessment  & Plan :    Principal Problem:   Acute respiratory failure with hypoxia (HCC) Active Problems:   OSA (obstructive sleep apnea)   Obesity (BMI 30-39.9)   Chronic kidney disease   Hypertension   Acute on  chronic renal failure (HCC)   CAP (community acquired pneumonia)   Anemia   Acute pulmonary edema (HCC)   Acute respiratory failure (HCC)   Chronic diastolic heart failure (HCC)   Hyperkalemia  Acidosis   Accelerated hypertension   Hypotension Secondary to dialysis resolved after ns 278mL yesterday. 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 Currently on HD -Daily weights -Strict I/Os - making arrangements for OP dialysis in [place] awaiting CLIP -off lisinopril  Acute respiratory failure with hypoxia -Secondary to pulmonary edema and OSA; question pneumonia Currently in room air -Continue dialysis for fluid removal patient received azithromycin/Rocephin from 7/22-7/26 -repeat CXR after second HD--improving edema but persistent LUL opacity   Acute on chronic diastolic CHFsecondary to volume overload secondary to ESRD -Echocardiogram--EF 60-65%, moderate LAE, elevated LVEDP -06/06/2015 echo EF 60-65%, grade 2 DD, no WMA -Accurate I's and O's -Dialysis fluid removal -Continue metoprolol succinate -Continue hydralazine and Isordil ACE inhibitor and Lasix have been discontinued  CAP? -Check procalcitonin-->1.45-->1.63 azithromycin and ceftriaxone as PCT 1.45,patient received azithromycin/Rocephin from 7/22-7/26 -repeat CXR after second HD--improving edema but persistent LUL opacity -WBC stable but afebrile   Hypertension -Continue amlodipine, metoprolol succinate and hydralazine -anticipate BP improvement with continued HD  Anemia of CKD/iron deficiency anemia -Iron saturation 7%, ferritin 160 -Iron supplementation -Continue Aranesp per nephrology  Diabetes mellitus type 2 -Hemoglobin A1c--6.7, patient on glipizide however discontinued due to end-stage renal disease Patient to continue with insulin 70/30 and continue Accu-Cheks He was instructed  about troponin monitoring his CBG and he voiced understanding   Depression -Continue Zoloft  Hyperkalemia -improved with HD  OSA -noncompliant with CPAP at home      Code Status : FULL CODE  Family Communication  :   Disposition Plan  : home  Barriers For Discharge :   Consults  :  nephrology  Procedures  : HD M, W, F  DVT Prophylaxis  : Heparin - SCDs   Lab Results  Component Value Date   PLT 369 04/19/2016    Antibiotics  :    Anti-infectives    Start     Dose/Rate Route Frequency Ordered Stop   04/13/16 2200  azithromycin (ZITHROMAX) tablet 500 mg     500 mg Oral Every 24 hours 04/13/16 1025 04/16/16 2245   04/12/16 2300  azithromycin (ZITHROMAX) 500 mg in dextrose 5 % 250 mL IVPB  Status:  Discontinued     500 mg 250 mL/hr over 60 Minutes Intravenous Every 24 hours 04/12/16 0840 04/13/16 1025   04/12/16 2300  cefTRIAXone (ROCEPHIN) 1 g in dextrose 5 % 50 mL IVPB     1 g 100 mL/hr over 30 Minutes Intravenous Every 24 hours 04/12/16 0840 04/16/16 2317   04/12/16 0254  azithromycin (ZITHROMAX) 500 MG injection    Comments:  Collene Gobble   : cabinet override      04/12/16 0254 04/12/16 1459   04/12/16 0245  cefTRIAXone (ROCEPHIN) 1 g in dextrose 5 % 50 mL IVPB     1 g 100 mL/hr over 30 Minutes Intravenous  Once 04/12/16 0243 04/12/16 0405   04/12/16 0245  azithromycin (ZITHROMAX) 500 mg in dextrose 5 % 250 mL IVPB     500 mg 250 mL/hr over 60 Minutes Intravenous  Once 04/12/16 0243 04/12/16 0402        Objective:   Vitals:   04/18/16 1651 04/18/16 2045 04/19/16 0459 04/19/16 1014  BP: (!) 119/49 (!) 126/47 (!) 131/48 (!) 122/55  Pulse: 67 75 63 66  Resp: 17 18 19 18   Temp: 98.2 F (36.8 C) 98.9 F (37.2 C) 99.2 F (37.3  C) 98.2 F (36.8 C)  TempSrc: Oral Oral Oral Oral  SpO2: 100% 99% 98% 100%  Weight:  108.5 kg (239 lb 3.2 oz)    Height:        Wt Readings from Last 3 Encounters:  04/18/16 108.5 kg (239 lb 3.2 oz)  08/02/15 119.7  kg (264 lb)  06/27/15 117 kg (258 lb)     Intake/Output Summary (Last 24 hours) at 04/19/16 1043 Last data filed at 04/19/16 0903  Gross per 24 hour  Intake              600 ml  Output             2223 ml  Net            -1623 ml     Physical Exam  Awake Alert, Oriented X 3, No new F.N deficits, Normal affect Schley.AT,PERRAL Supple Neck,No JVD, No cervical lymphadenopathy appriciated.  Symmetrical Chest wall movement, Good air movement bilaterally, CTAB RRR,No Gallops,Rubs or new Murmurs, No Parasternal Heave +ve B.Sounds, Abd Soft, No tenderness, No organomegaly appriciated, No rebound - guarding or rigidity. No Cyanosis, Clubbing or edema, No new Rash or bruise      Data Review:    CBC  Recent Labs Lab 04/14/16 0409 04/15/16 0547 04/16/16 0351 04/18/16 0745 04/19/16 0537  WBC 12.7* 12.7* 12.2* 10.3 11.0*  HGB 8.1* 7.6* 7.5* 7.8* 7.8*  HCT 25.4* 24.1* 24.0* 24.2* 24.7*  PLT 299 308 323 389 369  MCV 82.2 82.5 83.9 82.6 84.3  MCH 26.2 26.0 26.2 26.6 26.6  MCHC 31.9 31.5 31.3 32.2 31.6  RDW 16.8* 16.5* 16.4* 16.0* 16.0*    Chemistries   Recent Labs Lab 04/15/16 0547 04/16/16 0351 04/17/16 0417 04/18/16 0745 04/19/16 0537  NA 134* 135 133* 134* 131*  K 4.3 4.3 3.9 4.2 4.0  CL 100* 102 96* 98* 94*  CO2 22 21* 24 20* 24  GLUCOSE 178* 194* 180* 197* 215*  BUN 49* 61* 50* 65* 40*  CREATININE 7.44* 9.01* 8.02* 10.17* 7.20*  CALCIUM 8.1* 7.9* 8.0* 8.1* 8.1*  AST  --   --   --  34  --   ALT  --   --   --  58  --   ALKPHOS  --   --   --  68  --   BILITOT  --   --   --  0.5  --    ------------------------------------------------------------------------------------------------------------------ No results for input(s): CHOL, HDL, LDLCALC, TRIG, CHOLHDL, LDLDIRECT in the last 72 hours.  Lab Results  Component Value Date   HGBA1C 6.7 (H) 04/13/2016    ------------------------------------------------------------------------------------------------------------------ No results for input(s): TSH, T4TOTAL, T3FREE, THYROIDAB in the last 72 hours.  Invalid input(s): FREET3 ------------------------------------------------------------------------------------------------------------------ No results for input(s): VITAMINB12, FOLATE, FERRITIN, TIBC, IRON, RETICCTPCT in the last 72 hours.  Coagulation profile No results for input(s): INR, PROTIME in the last 168 hours.  No results for input(s): DDIMER in the last 72 hours.  Cardiac Enzymes No results for input(s): CKMB, TROPONINI, MYOGLOBIN in the last 168 hours.  Invalid input(s): CK ------------------------------------------------------------------------------------------------------------------    Component Value Date/Time   BNP 140.1 (H) 04/12/2016 0002    Inpatient Medications  Scheduled Meds: . amLODipine  10 mg Oral QHS  . antiseptic oral rinse  7 mL Mouth Rinse BID  . calcitRIOL  1 mcg Intravenous Q M,W,F-HD  . calcium acetate  1,334 mg Oral TID WC  . [START ON 04/21/2016] darbepoetin (ARANESP) injection -  DIALYSIS  100 mcg Intravenous Q Mon-HD  . feeding supplement (NEPRO CARB STEADY)  237 mL Oral Q1500  . fluticasone  2 spray Each Nare Daily  . insulin aspart  0-5 Units Subcutaneous QHS  . insulin aspart  0-9 Units Subcutaneous TID WC  . insulin aspart  3 Units Subcutaneous TID WC  . insulin glargine  5 Units Subcutaneous QHS  . isosorbide dinitrate  30 mg Oral TID  . metoprolol  200 mg Oral QHS  . multivitamin  1 tablet Oral QHS  . pantoprazole  40 mg Oral Daily  . sertraline  25 mg Oral Daily  . simvastatin  20 mg Oral q1800   Continuous Infusions:  PRN Meds:.acetaminophen **OR** acetaminophen, albuterol, ALPRAZolam, bisacodyl, hydrALAZINE, morphine injection, ondansetron **OR** ondansetron (ZOFRAN) IV, phenol  Micro Results Recent Results (from the past 240  hour(s))  MRSA PCR Screening     Status: Abnormal   Collection Time: 04/12/16  6:46 AM  Result Value Ref Range Status   MRSA by PCR POSITIVE (A) NEGATIVE Final    Comment:        The GeneXpert MRSA Assay (FDA approved for NASAL specimens only), is one component of a comprehensive MRSA colonization surveillance program. It is not intended to diagnose MRSA infection nor to guide or monitor treatment for MRSA infections. RESULT CALLED TO, READ BACK BY AND VERIFIED WITH: A. BRAKE 07.22.2017 [id number]    Culture, blood (routine x 2)     Status: None   Collection Time: 04/12/16 10:26 AM  Result Value Ref Range Status   Specimen Description BLOOD RIGHT HAND  Final   Special Requests BOTTLES DRAWN AEROBIC ONLY 5CC  Final   Culture NO GROWTH 5 DAYS  Final   Report Status 04/17/2016 FINAL  Final  Culture, blood (routine x 2)     Status: None   Collection Time: 04/12/16 10:43 AM  Result Value Ref Range Status   Specimen Description BLOOD RIGHT HAND  Final   Special Requests BOTTLES DRAWN AEROBIC AND ANAEROBIC 10CC  Final   Culture NO GROWTH 5 DAYS  Final   Report Status 04/17/2016 FINAL  Final    Radiology Reports Dg Chest 2 View  Result Date: 04/14/2016 CLINICAL DATA:  Acute respiratory failure EXAM: CHEST  2 VIEW COMPARISON:  04/12/2016 FINDINGS: Cardiac shadow is stable but mildly enlarged. The central vascular congestion and edema seen on the recent exam has improved somewhat in the interval. Persistent elevation the right hemidiaphragm is noted. No new focal infiltrates are seen. IMPRESSION: Improved but persistent pulmonary edema. Continued follow-up is recommended. Electronically Signed   By: Inez Catalina M.D.   On: 04/14/2016 20:14  Dg Chest 2 View  Result Date: 04/12/2016 CLINICAL DATA:  Shortness of breath beginning 1 hour ago. Dry cough for 1 week. EXAM: CHEST  2 VIEW COMPARISON:  05/25/2015 FINDINGS: Mild cardiomegaly. Small pleural effusion suggested laterally. There is  perihilar opacity that is asymmetric. There is some fissural thickening but no Kerley lines or cephalized blood flow. IMPRESSION: Bilateral opacity favoring pneumonia over asymmetric edema. Followup PA and lateral chest X-ray is recommended in 3-4 weeks following therapy. Electronically Signed   By: Monte Fantasia M.D.   On: 04/12/2016 00:35    Time Spent in minutes  30   Jani Gravel M.D on 04/19/2016 at 10:43 AM  Between 7am to 7pm - Pager - [phone]  After 7pm go to [email or web address] - password [facility]  [facility] -  Office  [phone]

## 2016-04-20 LAB — GLUCOSE, CAPILLARY
GLUCOSE-CAPILLARY: 225 mg/dL — AB (ref 65–99)
Glucose-Capillary: 213 mg/dL — ABNORMAL HIGH (ref 65–99)
Glucose-Capillary: 242 mg/dL — ABNORMAL HIGH (ref 65–99)
Glucose-Capillary: 297 mg/dL — ABNORMAL HIGH (ref 65–99)

## 2016-04-20 MED ORDER — CALCITRIOL 0.5 MCG PO CAPS
0.5000 ug | ORAL_CAPSULE | ORAL | Status: DC
  Administered 2016-04-20: 0.5 ug via ORAL
  Filled 2016-04-20: qty 1

## 2016-04-20 NOTE — Plan of Care (Signed)
Problem: Discharge Planning: Goal: Ability to manage health-related needs will improve Outcome: Progressing Waiting to be clipped for outpatient hemodialysis in [place]. Pending inpatient hemodialysis tomorrow 7/31.

## 2016-04-20 NOTE — Progress Notes (Signed)
Subjective: Interval History: has no complaint .  Objective: Vital signs in last 24 hours: Temp:  [97.9 F (36.6 C)-98.2 F (36.8 C)] 98.2 F (36.8 C) (07/30 0621) Pulse Rate:  [64-70] 66 (07/30 0621) Resp:  [18] 18 (07/30 0621) BP: (118-147)/(52-61) 147/61 (07/30 0621) SpO2:  [98 %-100 %] 98 % (07/30 0621) Weight:  [110.5 kg (243 lb 8 oz)-110.5 kg (243 lb 9.7 oz)] 110.5 kg (243 lb 9.7 oz) (07/30 0435) Weight change: -0.349 kg (-12.3 oz)  Intake/Output from previous day: 07/29 0701 - 07/30 0700 In: 600 [P.O.:600] Out: 120 [Urine:120] Intake/Output this shift: No intake/output data recorded.  General appearance: alert, cooperative and moderately obese Resp: clear to auscultation bilaterally Cardio: S1, S2 normal and systolic murmur: holosystolic 2/6, blowing at apex GI: obese, liver down 3 cm , soft,pos bs ExtremitieAVF LLA AVF LLA   Lab Results:  Recent Labs  04/18/16 0745 04/19/16 0537  WBC 10.3 11.0*  HGB 7.8* 7.8*  HCT 24.2* 24.7*  PLT 389 369   BMET:  Recent Labs  04/18/16 0745 04/19/16 0537  NA 134* 131*  K 4.2 4.0  CL 98* 94*  CO2 20* 24  GLUCOSE 197* 215*  BUN 65* 40*  CREATININE 10.17* 7.20*  CALCIUM 8.1* 8.1*   No results for input(s): PTH in the last 72 hours. Iron Studies: No results for input(s): IRON, TIBC, TRANSFERRIN, FERRITIN in the last 72 hours.  Studies/Results: No results found.  I have reviewed the patient's current medications.  Assessment/Plan: 1 ESRD HD tomorrow, stable .Awaitning placement 2 DM controlled 3 Anemia Fe,esa 4 HPTH vit D 5 Obesity need limited cal 6HTN controlled on just beta blocker and vol control P HD, esa, vit D, place    LOS: 8 days   Aveleen Nevers L 04/20/2016,7:08 AM

## 2016-04-20 NOTE — Progress Notes (Signed)
Patient ID: Micheal [name], male   DOB: 03-25-1956, 60 y.o.   MRN: [id number]                                                                PROGRESS NOTE                                                                                                                                                                                                             Patient Demographics:    Micheal [name], is a 60 y.o. male, DOB - November 17, 1955, [id number]  Admit date - 04/11/2016   Admitting Physician Vianne Bulls, MD  Outpatient Primary MD for the patient is WEBB, Valla Leaver, MD  LOS - 8  Outpatient Specialists:    Chief Complaint  Patient presents with  . Shortness of Breath       Brief Narrative  60 year old male with a history of CKD stage V, diabetes mellitus, hypertension, diastolic CHF, OSA presented with one-day history of shortness of breath. The patient woke up with shortness of breath on the morning of 04/11/2016. He complains of some chest discomfort with coughing. Denies any fevers, chills, nausea, vomiting, diarrhea, abdominal pain. However, the patient stated that he has had some dyspnea on exertion for the better part of one month prior to his decompensation on 04/11/2016. Upon presentation, the patient was noted to be hypoxic with oxygen saturation of 88% on room air. Chest x-ray showed right lower lobe and left upper lobe opacity with increased interstitial markings. The patient was started on ceftriaxone and azithromycin. Nephrology was consulted, and the patient was initiated on dialysis.    Subjective:    Micheal [name] today has, No headache, No chest pain, No abdominal pain - No Nausea, No new weakness tingling or numbness, No Cough - SOB.   Doing well, awaiting CLIP   Assessment  & Plan :    Principal Problem:   Acute respiratory failure with hypoxia ([place]) Active Problems:   OSA (obstructive sleep apnea)   Obesity (BMI 30-39.9)   Chronic kidney disease   Hypertension  Acute on chronic renal failure (HCC)   CAP (community acquired pneumonia)   Anemia   Acute pulmonary edema (HCC)   Acute respiratory failure (HCC)   Chronic diastolic heart failure ([place])  Hyperkalemia   Acidosis   Accelerated hypertension   ESRD on dialysis ([place])   CKD stage V-->ESRD Patient presenting with progression of his CKD, uremic symptoms, mild hyperkalemia, and fluid overload. Currently on HD -Daily weights -Strict I/Os - making arrangements for OP dialysis in [place] awaiting CLIP -offlisinopril  Hypotension on 7/28 Secondary to dialysis resolved after ns 28mLCont to monitor bp   Acute respiratory failure with hypoxia -Secondary to pulmonary edema and OSA; question pneumonia Currently in room air -Continue dialysis for fluid removal patient received azithromycin/Rocephin from 7/22-7/26 -repeat CXR after second HD--improving edema but persistent LUL opacity   Acute on chronic diastolic CHFsecondary to volume overload secondary to ESRD -Echocardiogram--EF 60-65%, moderate LAE, elevated LVEDP -06/06/2015 echo EF 60-65%, grade 2 DD, no WMA -Accurate I's and O's -Dialysis fluid removal -Continue metoprolol succinate -Continue hydralazine and Isordil ACE inhibitor and Lasix have been discontinued  CAP? -Check procalcitonin-->1.45-->1.63 azithromycin and ceftriaxone as PCT 1.45,patient received azithromycin/Rocephin from 7/22-7/26 -repeat CXR after second HD--improving edema but persistent LUL opacity -WBC stable but afebrile   Hypertension -Continue amlodipine, metoprolol succinate and hydralazine -anticipate BP improvement with continued HD  Anemia of CKD/iron deficiency anemia -Iron saturation 7%, ferritin 160 -Iron supplementation -Continue Aranesp per nephrology  Diabetes mellitus type 2 -Hemoglobin A1c--6.7, patient on glipizide however discontinued due to end-stage renal disease Patient to continue with insulin 70/30 and continue  Accu-Cheks He was instructed about troponin monitoring his CBG and he voiced understanding   Depression -Continue Zoloft  Hyperkalemia -improved with HD  OSA -noncompliant with CPAP at home      Code Status : FULL CODE  Family Communication  :   Disposition Plan  : home  Barriers For Discharge :  clip  Consults  :  nephrology  Procedures  : HD  DVT Prophylaxis  :  Heparin - SCDs  Lab Results  Component Value Date   PLT 369 04/19/2016    Antibiotics  :    Anti-infectives    Start     Dose/Rate Route Frequency Ordered Stop   04/13/16 2200  azithromycin (ZITHROMAX) tablet 500 mg     500 mg Oral Every 24 hours 04/13/16 1025 04/16/16 2245   04/12/16 2300  azithromycin (ZITHROMAX) 500 mg in dextrose 5 % 250 mL IVPB  Status:  Discontinued     500 mg 250 mL/hr over 60 Minutes Intravenous Every 24 hours 04/12/16 0840 04/13/16 1025   04/12/16 2300  cefTRIAXone (ROCEPHIN) 1 g in dextrose 5 % 50 mL IVPB     1 g 100 mL/hr over 30 Minutes Intravenous Every 24 hours 04/12/16 0840 04/16/16 2317   04/12/16 0254  azithromycin (ZITHROMAX) 500 MG injection    Comments:  Collene Gobble   : cabinet override      04/12/16 0254 04/12/16 1459   04/12/16 0245  cefTRIAXone (ROCEPHIN) 1 g in dextrose 5 % 50 mL IVPB     1 g 100 mL/hr over 30 Minutes Intravenous  Once 04/12/16 0243 04/12/16 0405   04/12/16 0245  azithromycin (ZITHROMAX) 500 mg in dextrose 5 % 250 mL IVPB     500 mg 250 mL/hr over 60 Minutes Intravenous  Once 04/12/16 0243 04/12/16 0402        Objective:   Vitals:   04/19/16 2100 04/20/16 0435 04/20/16 0621 04/20/16 0935  BP:   (!) 147/61 (!) 140/55  Pulse:   66 64  Resp:   18 18  Temp:   98.2 F (36.8 C)  98 F (36.7 C)  TempSrc:   Oral Oral  SpO2:   98% 97%  Weight: 110.5 kg (243 lb 8 oz) 110.5 kg (243 lb 9.7 oz)    Height:        Wt Readings from Last 3 Encounters:  04/20/16 110.5 kg (243 lb 9.7 oz)  08/02/15 119.7 kg (264 lb)  06/27/15 117 kg  (258 lb)     Intake/Output Summary (Last 24 hours) at 04/20/16 1054 Last data filed at 04/20/16 0900  Gross per 24 hour  Intake              480 ml  Output              120 ml  Net              360 ml     Physical Exam  Awake Alert, Oriented X 3, No new F.N deficits, Normal affect [place].AT,PERRAL Supple Neck,No JVD, No cervical lymphadenopathy appriciated.  Symmetrical Chest wall movement, Good air movement bilaterally, CTAB RRR,No Gallops,Rubs.  + 2/6 sem rusb/ apex.  No Parasternal Heave +ve B.Sounds, Abd Soft, No tenderness, No organomegaly appriciated, No rebound - guarding or rigidity. No Cyanosis, Clubbing or edema, No new Rash or bruise   L AVF    Data Review:    CBC  Recent Labs Lab 04/14/16 0409 04/15/16 0547 04/16/16 0351 04/18/16 0745 04/19/16 0537  WBC 12.7* 12.7* 12.2* 10.3 11.0*  HGB 8.1* 7.6* 7.5* 7.8* 7.8*  HCT 25.4* 24.1* 24.0* 24.2* 24.7*  PLT 299 308 323 389 369  MCV 82.2 82.5 83.9 82.6 84.3  MCH 26.2 26.0 26.2 26.6 26.6  MCHC 31.9 31.5 31.3 32.2 31.6  RDW 16.8* 16.5* 16.4* 16.0* 16.0*    Chemistries   Recent Labs Lab 04/15/16 0547 04/16/16 0351 04/17/16 0417 04/18/16 0745 04/19/16 0537  NA 134* 135 133* 134* 131*  K 4.3 4.3 3.9 4.2 4.0  CL 100* 102 96* 98* 94*  CO2 22 21* 24 20* 24  GLUCOSE 178* 194* 180* 197* 215*  BUN 49* 61* 50* 65* 40*  CREATININE 7.44* 9.01* 8.02* 10.17* 7.20*  CALCIUM 8.1* 7.9* 8.0* 8.1* 8.1*  AST  --   --   --  34  --   ALT  --   --   --  58  --   ALKPHOS  --   --   --  68  --   BILITOT  --   --   --  0.5  --    ------------------------------------------------------------------------------------------------------------------ No results for input(s): CHOL, HDL, LDLCALC, TRIG, CHOLHDL, LDLDIRECT in the last 72 hours.  Lab Results  Component Value Date   HGBA1C 6.7 (H) 04/13/2016   ------------------------------------------------------------------------------------------------------------------ No  results for input(s): TSH, T4TOTAL, T3FREE, THYROIDAB in the last 72 hours.  Invalid input(s): FREET3 ------------------------------------------------------------------------------------------------------------------ No results for input(s): VITAMINB12, FOLATE, FERRITIN, TIBC, IRON, RETICCTPCT in the last 72 hours.  Coagulation profile No results for input(s): INR, PROTIME in the last 168 hours.  No results for input(s): DDIMER in the last 72 hours.  Cardiac Enzymes No results for input(s): CKMB, TROPONINI, MYOGLOBIN in the last 168 hours.  Invalid input(s): CK ------------------------------------------------------------------------------------------------------------------    Component Value Date/Time   BNP 140.1 (H) 04/12/2016 0002    Inpatient Medications  Scheduled Meds: . amLODipine  10 mg Oral QHS  . calcitRIOL  0.5 mcg Oral QODAY  . calcium acetate  1,334 mg Oral TID WC  . [START ON 04/21/2016] darbepoetin (ARANESP) injection -  DIALYSIS  100 mcg Intravenous Q Mon-HD  . feeding supplement (NEPRO CARB STEADY)  237 mL Oral Q1500  . fluticasone  2 spray Each Nare Daily  . insulin aspart  0-5 Units Subcutaneous QHS  . insulin aspart  0-9 Units Subcutaneous TID WC  . insulin aspart  3 Units Subcutaneous TID WC  . insulin glargine  5 Units Subcutaneous QHS  . isosorbide dinitrate  30 mg Oral TID  . metoprolol  200 mg Oral QHS  . multivitamin  1 tablet Oral QHS  . pantoprazole  40 mg Oral Daily  . sertraline  25 mg Oral Daily  . simvastatin  20 mg Oral q1800   Continuous Infusions:  PRN Meds:.acetaminophen **OR** acetaminophen, albuterol, ALPRAZolam, bisacodyl, hydrALAZINE, morphine injection, ondansetron **OR** ondansetron (ZOFRAN) IV, phenol  Micro Results Recent Results (from the past 240 hour(s))  MRSA PCR Screening     Status: Abnormal   Collection Time: 04/12/16  6:46 AM  Result Value Ref Range Status   MRSA by PCR POSITIVE (A) NEGATIVE Final    Comment:          The GeneXpert MRSA Assay (FDA approved for NASAL specimens only), is one component of a comprehensive MRSA colonization surveillance program. It is not intended to diagnose MRSA infection nor to guide or monitor treatment for MRSA infections. RESULT CALLED TO, READ BACK BY AND VERIFIED WITH: A. BRAKE 07.22.2017 [id number] [address]MORRIS    Culture, blood (routine x 2)     Status: None   Collection Time: 04/12/16 10:26 AM  Result Value Ref Range Status   Specimen Description BLOOD RIGHT HAND  Final   Special Requests BOTTLES DRAWN AEROBIC ONLY 5CC  Final   Culture NO GROWTH 5 DAYS  Final   Report Status 04/17/2016 FINAL  Final  Culture, blood (routine x 2)     Status: None   Collection Time: 04/12/16 10:43 AM  Result Value Ref Range Status   Specimen Description BLOOD RIGHT HAND  Final   Special Requests BOTTLES DRAWN AEROBIC AND ANAEROBIC 10CC  Final   Culture NO GROWTH 5 DAYS  Final   Report Status 04/17/2016 FINAL  Final    Radiology Reports Dg Chest 2 View  Result Date: 04/14/2016 CLINICAL DATA:  Acute respiratory failure EXAM: CHEST  2 VIEW COMPARISON:  04/12/2016 FINDINGS: Cardiac shadow is stable but mildly enlarged. The central vascular congestion and edema seen on the recent exam has improved somewhat in the interval. Persistent elevation the right hemidiaphragm is noted. No new focal infiltrates are seen. IMPRESSION: Improved but persistent pulmonary edema. Continued follow-up is recommended. Electronically Signed   By: Inez Catalina M.D.   On: 04/14/2016 20:14  Dg Chest 2 View  Result Date: 04/12/2016 CLINICAL DATA:  Shortness of breath beginning 1 hour ago. Dry cough for 1 week. EXAM: CHEST  2 VIEW COMPARISON:  05/25/2015 FINDINGS: Mild cardiomegaly. Small pleural effusion suggested laterally. There is perihilar opacity that is asymmetric. There is some fissural thickening but no Kerley lines or cephalized blood flow. IMPRESSION: Bilateral opacity favoring pneumonia over asymmetric  edema. Followup PA and lateral chest X-ray is recommended in 3-4 weeks following therapy. Electronically Signed   By: Monte Fantasia M.D.   On: 04/12/2016 00:35    Time Spent in minutes  30   Jani Gravel M.D on 04/20/2016 at 10:54 AM  Between 7am to 7pm - Pager - [phone]  After 7pm go to [email or web address] - password [facility]  [facility] -  Office  [phone]

## 2016-04-21 DIAGNOSIS — Z992 Dependence on renal dialysis: Secondary | ICD-10-CM

## 2016-04-21 DIAGNOSIS — N186 End stage renal disease: Secondary | ICD-10-CM

## 2016-04-21 LAB — COMPREHENSIVE METABOLIC PANEL
ALBUMIN: 2.8 g/dL — AB (ref 3.5–5.0)
ALT: 43 U/L (ref 17–63)
AST: 21 U/L (ref 15–41)
Alkaline Phosphatase: 60 U/L (ref 38–126)
Anion gap: 13 (ref 5–15)
BUN: 65 mg/dL — AB (ref 6–20)
CHLORIDE: 98 mmol/L — AB (ref 101–111)
CO2: 24 mmol/L (ref 22–32)
Calcium: 8.4 mg/dL — ABNORMAL LOW (ref 8.9–10.3)
Creatinine, Ser: 10.19 mg/dL — ABNORMAL HIGH (ref 0.61–1.24)
GFR calc Af Amer: 6 mL/min — ABNORMAL LOW (ref >60)
GFR, EST NON AFRICAN AMERICAN: 5 mL/min — AB (ref >60)
Glucose, Bld: 171 mg/dL — ABNORMAL HIGH (ref 65–99)
POTASSIUM: 4 mmol/L (ref 3.5–5.1)
SODIUM: 135 mmol/L (ref 135–145)
Total Bilirubin: 0.7 mg/dL (ref 0.3–1.2)
Total Protein: 6.8 g/dL (ref 6.5–8.1)

## 2016-04-21 LAB — CBC
HCT: 24.5 % — ABNORMAL LOW (ref 39.0–52.0)
Hemoglobin: 7.7 g/dL — ABNORMAL LOW (ref 13.0–17.0)
MCH: 26.3 pg (ref 26.0–34.0)
MCHC: 31.4 g/dL (ref 30.0–36.0)
MCV: 83.6 fL (ref 78.0–100.0)
PLATELETS: 373 10*3/uL (ref 150–400)
RBC: 2.93 MIL/uL — ABNORMAL LOW (ref 4.22–5.81)
RDW: 16 % — AB (ref 11.5–15.5)
WBC: 9.2 10*3/uL (ref 4.0–10.5)

## 2016-04-21 LAB — GLUCOSE, CAPILLARY
GLUCOSE-CAPILLARY: 204 mg/dL — AB (ref 65–99)
Glucose-Capillary: 153 mg/dL — ABNORMAL HIGH (ref 65–99)
Glucose-Capillary: 321 mg/dL — ABNORMAL HIGH (ref 65–99)

## 2016-04-21 MED ORDER — HEPARIN SODIUM (PORCINE) 1000 UNIT/ML DIALYSIS: 1000.0000 [IU] | INTRAMUSCULAR | Status: DC | PRN

## 2016-04-21 MED ORDER — CALCITRIOL 0.5 MCG PO CAPS: 0.5000 ug | ORAL_CAPSULE | ORAL | 1 refills | Status: AC

## 2016-04-21 MED ORDER — INSULIN NPH (HUMAN) (ISOPHANE) 100 UNIT/ML ~~LOC~~ SUSP: 15.0000 [IU] | Freq: Two times a day (BID) | SUBCUTANEOUS | 11 refills | Status: DC

## 2016-04-21 MED ORDER — SODIUM CHLORIDE 0.9 % IV SOLN: 100.0000 mL | INTRAVENOUS | Status: DC | PRN

## 2016-04-21 MED ORDER — CALCITRIOL 0.5 MCG PO CAPS
ORAL_CAPSULE | ORAL | Status: AC
  Administered 2016-04-21: 0.5 ug via ORAL
  Filled 2016-04-21: qty 1

## 2016-04-21 MED ORDER — SODIUM CHLORIDE 0.9 % IV SOLN
125.0000 mg | INTRAVENOUS | Status: DC
  Administered 2016-04-21: 125 mg via INTRAVENOUS
  Filled 2016-04-21 (×2): qty 10

## 2016-04-21 MED ORDER — DARBEPOETIN ALFA 100 MCG/0.5ML IJ SOSY
PREFILLED_SYRINGE | INTRAMUSCULAR | Status: AC
  Administered 2016-04-21: 100 ug via INTRAVENOUS
  Filled 2016-04-21: qty 0.5

## 2016-04-21 MED ORDER — NEPRO/CARBSTEADY PO LIQD: 237.0000 mL | Freq: Every day | ORAL | 0 refills | Status: DC

## 2016-04-21 MED ORDER — HEPARIN SODIUM (PORCINE) 1000 UNIT/ML DIALYSIS
100.0000 [IU]/kg | INTRAMUSCULAR | Status: DC | PRN
  Filled 2016-04-21: qty 12

## 2016-04-21 MED ORDER — PENTAFLUOROPROP-TETRAFLUOROETH EX AERO: 1.0000 "application " | INHALATION_SPRAY | CUTANEOUS | Status: DC | PRN

## 2016-04-21 MED ORDER — LIDOCAINE-PRILOCAINE 2.5-2.5 % EX CREA: 1.0000 "application " | TOPICAL_CREAM | CUTANEOUS | Status: DC | PRN

## 2016-04-21 MED ORDER — LIDOCAINE HCL (PF) 1 % IJ SOLN: 5.0000 mL | INTRAMUSCULAR | Status: DC | PRN

## 2016-04-21 MED ORDER — ALTEPLASE 2 MG IJ SOLR: 2.0000 mg | Freq: Once | INTRAMUSCULAR | Status: DC | PRN

## 2016-04-21 MED ORDER — CALCITRIOL 0.5 MCG PO CAPS
0.5000 ug | ORAL_CAPSULE | ORAL | Status: DC
  Administered 2016-04-21: 0.5 ug via ORAL

## 2016-04-21 NOTE — Progress Notes (Signed)
[facility] Brief Note  Pt's wife was able to connect with the practice pt will be going to, was given an appointment for 11:15 tomorrow for pt to be seen by Dr. Howell Pringle [facility]) and was given a chair time at [facility] for 11:15 AM on Wednesday and he will have a MWF outpatient HD spot. Payment for his dialysis was approved through the [facility]).   Therefore, all arrangements are complete, and he can be discharged.  Jamal Maes, MD [facility] [phone] Pager 04/21/2016, 6:07 PM

## 2016-04-21 NOTE — Progress Notes (Signed)
Patient was given discharge orders with follow up care ,IV discontinued

## 2016-04-21 NOTE — Procedures (Signed)
I have personally attended this patient's dialysis session.   HD today - 3 liter goal AVF cannulated no issues For dose of IV fe, darbe and po calcitriol with HD  Jamal Maes, MD [place] Pager 04/21/2016, 8:44 AM

## 2016-04-21 NOTE — Progress Notes (Signed)
Inpatient Diabetes Program Recommendations  AACE/ADA: New Consensus Statement on Inpatient Glycemic Control (2015)  Target Ranges:  Prepandial:   less than 140 mg/dL      Peak postprandial:   less than 180 mg/dL (1-2 hours)      Critically ill patients:  140 - 180 mg/dL   Results for STEBAN, MADDEN (MRN [id number]) as of 04/21/2016 12:52  Ref. Range 04/20/2016 12:20 04/20/2016 17:07 04/20/2016 20:57 04/21/2016 07:44 04/21/2016 12:29  Glucose-Capillary Latest Ref Range: 65 - 99 mg/dL 242 (H) 225 (H) 297 (H) 204 (H) 153 (H)   Inpatient Diabetes Program Recommendations:  Please consider increase in Lantus insulin to 8 units daily.  Thank you, Nani Gasser. Marra Fraga, RN, MSN, CDE Inpatient Glycemic Control Team Team Pager [phone] (8am-5pm) 04/21/2016 12:53 PM

## 2016-04-21 NOTE — Progress Notes (Signed)
Nephrology Daily Progress Note  Subjective: Patient was seen and examined at HD.  Reports feeling well and has no complaints.  Objective: Vital signs in last 24 hours: Vitals:   04/21/16 1030 04/21/16 1100 04/21/16 1130 04/21/16 1200  BP: 123/67 106/66 122/66 130/63  Pulse: 67 65 66 64  Resp: 16 16 16 16   Temp:      TempSrc:      SpO2:      Weight:      Height:       Physical Exam Constitutional: He is oriented to person, place, and time. He appears well-developed and well-nourished.  Eyes: EOM are normal.  Cardiovascular: Normal rate, regular rhythm and intact distal pulses.  Exam reveals no gallop and no friction rub. Gr2/6 M.  Pulmonary/Chest: Breathing comfortably on room air. Decreased breath sounds at R lung base.  Abdominal: Obese. Bowel sounds are normal. There is no tenderness. There is no guarding.  Msk: L arm AVF: palpable thrill  Neurological: He is alert and oriented to person, place, and time.  Skin: Skin is warm and dry.  Extrem Trace edema   Recent Labs  04/19/16 0537 04/21/16 0421  NA 131* 135  K 4.0 4.0  CL 94* 98*  CO2 24 24  GLUCOSE 215* 171*  BUN 40* 65*  CREATININE 7.20* 10.19*  CALCIUM 8.1* 8.4*  PHOS 6.1*  --     Recent Labs  04/19/16 0537 04/21/16 0421  AST  --  21  ALT  --  43  ALKPHOS  --  60  BILITOT  --  0.7  PROT  --  6.8  ALBUMIN 2.7* 2.8*    Recent Labs  04/19/16 0537 04/21/16 0421  WBC 11.0* 9.2  HGB 7.8* 7.7*  HCT 24.7* 24.5*  MCV 84.3 83.6  PLT 369 373    Assessment/Plan: Principal Problem:   Acute respiratory failure with hypoxia (HCC) Active Problems:   OSA (obstructive sleep apnea)   Obesity (BMI 30-39.9)   Chronic kidney disease   Hypertension   Acute on chronic renal failure (HCC)   CAP (community acquired pneumonia)   Anemia   Acute pulmonary edema (HCC)   Acute respiratory failure (HCC)   Chronic diastolic heart failure (HCC)   Hyperkalemia   Acidosis   Accelerated hypertension   ESRD on  dialysis ([place])  CKD stage V Patient presenting with progression of his CKD, uremic symptoms, mild hyperkalemia, and fluid overload. Dialysis was initiated and BP improved with lower vol. Uremic symptoms have resolved. Potassium is normal.  -HD this am: UF 3L, BFR 400 cc/min   -F/u am renal function panel -Daily weights  -Strict I/Os - making arrangements for OP dialysis in [facility] -  Awaiting return call from [facility]  Hyperkalemia (resolved) Mildly hyperkalemic on admission; resolved with HD.   -CTM -F/u am renal function panel   Bones Phos 6.1 on 7/29. PTH high at 304.  -HD this am -Continue Phoslo 1334 mg TID with meals  -Change to Calcitriol 0.5 mcg with HD on MWF  Anemia of CKD Hgb 7.7 today; baseline is approximately 9. Iron studies c/w IDA.  He has already received a dose of IV Feraheme 160 mg.   -Continue Aranesp 100 mcg once a week -Start IV Nulecit 125 mg on MWF with dialysis - give 3 doses of 125.  Acute hypoxic respiratory failure and CAP 2/2 pulmonary edema and OSA. Repeat CXR after 2nd HD showing improving edema. -Continue HD for fluid removal  -Finished  course of antibiotics   dCHF Echo from 05/2015 showing EF 60-65%.  -HD today -Daily weight; strict I/Os  HTN BP improving with fluid removal via HD.   -Continue to hold ACEi -Continue other home meds: Amlodipine, isosorbide dinitrate, and metoprolol    DM2 -Mgmt per primary team   Shela Leff, MD PGY2 - IMTS Pager [phone]  I have seen and examined this patient and agree with plan and assessment in the above note. We are still waiting for acceptance from [facility] in [place]. Jamal Maes B,MD 04/21/2016 12:59 PM

## 2016-04-21 NOTE — Discharge Summary (Signed)
Physician Discharge Summary  ARNEY MAYABB MRN: [id number] DOB/AGE: 58/01/1956 58 y.o.  PCP: Jonathon Bellows, MD   Admit date: 04/11/2016 Discharge date: 04/21/2016  Discharge Diagnoses:    Principal Problem:   Acute respiratory failure with hypoxia ([place]) Active Problems:   OSA (obstructive sleep apnea)   Obesity (BMI 30-39.9)   Chronic kidney disease   Hypertension   Acute on chronic renal failure (HCC)   CAP (community acquired pneumonia)   Anemia   Acute pulmonary edema (HCC)   Acute respiratory failure (HCC)   Chronic diastolic heart failure (HCC)   Hyperkalemia   Acidosis   Accelerated hypertension   ESRD on dialysis [facility])    Follow-up recommendations Follow-up with PCP in 3-5 days , including all  additional recommended appointments as below Follow-up CBC, CMP in 3-5 days Patient to continue on outpatient hemodialysis at [facility]        Current Discharge Medication List    START taking these medications   Details  calcitRIOL (ROCALTROL) 0.5 MCG capsule Take 1 capsule (0.5 mcg total) by mouth every Monday, Wednesday, and Friday with hemodialysis. Qty: 30 capsule, Refills: 1    !! Nutritional Supplements (FEEDING SUPPLEMENT, NEPRO CARB STEADY,) LIQD Take 237 mLs by mouth daily as needed (Offer if patient eats </=50% of meal). Qty: 30 Can, Refills: 0    !! Nutritional Supplements (FEEDING SUPPLEMENT, NEPRO CARB STEADY,) LIQD Take 237 mLs by mouth daily at 3 pm. Qty: 10 Can, Refills: 0     !! - Potential duplicate medications found. Please discuss with provider.    CONTINUE these medications which have CHANGED   Details  hydrALAZINE (APRESOLINE) 25 MG tablet Take 1 tablet (25 mg total) by mouth 3 (three) times daily. Qty: 90 tablet, Refills: 1    insulin NPH Human (HUMULIN N,NOVOLIN N) 100 UNIT/ML injection Inject 0.15 mLs (15 Units total) into the skin 2 (two) times daily. Qty: 10 mL, Refills: 11      CONTINUE these medications which have NOT CHANGED    Details  ALPRAZolam (XANAX) 0.25 MG tablet Take 1 tablet as needed 3 times a day for panic attacks Refills: 0    amLODipine (NORVASC) 10 MG tablet Take 10 mg by mouth daily.    calcium acetate (PHOSLO) 667 MG capsule Take by mouth 3 (three) times daily with meals.    fluticasone (FLONASE) 50 MCG/ACT nasal spray Place 2 sprays into both nostrils daily. Refills: 5    isosorbide dinitrate (ISORDIL) 30 MG tablet Take 1 tablet (30 mg total) by mouth 3 (three) times daily. Qty: 90 tablet, Refills: 11    metoprolol (TOPROL XL) 200 MG 24 hr tablet Take 1 tablet (200 mg total) by mouth daily. Qty: 30 tablet, Refills: 11    omega-3 acid ethyl esters (LOVAZA) 1 G capsule Take 1 g by mouth daily.      omeprazole (PRILOSEC) 20 MG capsule Take 20 mg by mouth daily.      sertraline (ZOLOFT) 50 MG tablet Take 0.5 tablets by mouth daily.  Refills: 0    simvastatin (ZOCOR) 20 MG tablet Take 20 mg by mouth daily.        STOP taking these medications     furosemide (LASIX) 20 MG tablet      lisinopril (PRINIVIL,ZESTRIL) 40 MG tablet      glipiZIDE (GLUCOTROL) 10 MG tablet          Discharge Condition:Stable   Discharge Instructions Get Medicines reviewed and adjusted: Please take  all your medications with you for your next visit with your Primary MD  Please request your Primary MD to go over all hospital tests and procedure/radiological results at the follow up, please ask your Primary MD to get all Hospital records sent to his/her office.  If you experience worsening of your admission symptoms, develop shortness of breath, life threatening emergency, suicidal or homicidal thoughts you must seek medical attention immediately by calling 911 or calling your MD immediately if symptoms less severe.  You must read complete instructions/literature along with all the possible adverse reactions/side effects for all the Medicines you take and that have been prescribed to you. Take any new  Medicines after you have completely understood and accpet all the possible adverse reactions/side effects.   Do not drive when taking Pain medications.   Do not take more than prescribed Pain, Sleep and Anxiety Medications  Special Instructions: If you have smoked or chewed Tobacco in the last 2 yrs please stop smoking, stop any regular Alcohol and or any Recreational drug use.  Wear Seat belts while driving.  Please note  You were cared for by a hospitalist during your hospital stay. Once you are discharged, your primary care physician will handle any further medical issues. Please note that NO REFILLS for any discharge medications will be authorized once you are discharged, as it is imperative that you return to your primary care physician (or establish a relationship with a primary care physician if you do not have one) for your aftercare needs so that they can reassess your need for medications and monitor your lab values.  Discharge Instructions    Diet - low sodium heart healthy    Complete by:  As directed   Increase activity slowly    Complete by:  As directed       No Known Allergies    Disposition: 01-Home or Self Care   Consults: Nephrology     Significant Diagnostic Studies:  Dg Chest 2 View  Result Date: 04/14/2016 CLINICAL DATA:  Acute respiratory failure EXAM: CHEST  2 VIEW COMPARISON:  04/12/2016 FINDINGS: Cardiac shadow is stable but mildly enlarged. The central vascular congestion and edema seen on the recent exam has improved somewhat in the interval. Persistent elevation the right hemidiaphragm is noted. No new focal infiltrates are seen. IMPRESSION: Improved but persistent pulmonary edema. Continued follow-up is recommended. Electronically Signed   By: Inez Catalina M.D.   On: 04/14/2016 20:14  Dg Chest 2 View  Result Date: 04/12/2016 CLINICAL DATA:  Shortness of breath beginning 1 hour ago. Dry cough for 1 week. EXAM: CHEST  2 VIEW COMPARISON:   05/25/2015 FINDINGS: Mild cardiomegaly. Small pleural effusion suggested laterally. There is perihilar opacity that is asymmetric. There is some fissural thickening but no Kerley lines or cephalized blood flow. IMPRESSION: Bilateral opacity favoring pneumonia over asymmetric edema. Followup PA and lateral chest X-ray is recommended in 3-4 weeks following therapy. Electronically Signed   By: Monte Fantasia M.D.   On: 04/12/2016 00:35        Filed Weights   04/19/16 2100 04/20/16 0435 04/20/16 2103  Weight: 110.5 kg (243 lb 8 oz) 110.5 kg (243 lb 9.7 oz) 110 kg (242 lb 8 oz)     Microbiology: Recent Results (from the past 240 hour(s))  MRSA PCR Screening     Status: Abnormal   Collection Time: 04/12/16  6:46 AM  Result Value Ref Range Status   MRSA by PCR POSITIVE (A) NEGATIVE Final  Comment:        The GeneXpert MRSA Assay (FDA approved for NASAL specimens only), is one component of a comprehensive MRSA colonization surveillance program. It is not intended to diagnose MRSA infection nor to guide or monitor treatment for MRSA infections. RESULT CALLED TO, READ BACK BY AND VERIFIED WITH: A. BRAKE 07.22.2017 [phone] N.MORRIS    Culture, blood (routine x 2)     Status: None   Collection Time: 04/12/16 10:26 AM  Result Value Ref Range Status   Specimen Description BLOOD RIGHT HAND  Final   Special Requests BOTTLES DRAWN AEROBIC ONLY 5CC  Final   Culture NO GROWTH 5 DAYS  Final   Report Status 04/17/2016 FINAL  Final  Culture, blood (routine x 2)     Status: None   Collection Time: 04/12/16 10:43 AM  Result Value Ref Range Status   Specimen Description BLOOD RIGHT HAND  Final   Special Requests BOTTLES DRAWN AEROBIC AND ANAEROBIC 10CC  Final   Culture NO GROWTH 5 DAYS  Final   Report Status 04/17/2016 FINAL  Final       Blood Culture    Component Value Date/Time   SDES BLOOD RIGHT HAND 04/12/2016 1043   SPECREQUEST BOTTLES DRAWN AEROBIC AND ANAEROBIC 10CC 04/12/2016 1043    CULT NO GROWTH 5 DAYS 04/12/2016 1043   REPTSTATUS 04/17/2016 FINAL 04/12/2016 1043      Labs: Results for orders placed or performed during the hospital encounter of 04/11/16 (from the past 48 hour(s))  Glucose, capillary     Status: Abnormal   Collection Time: 04/19/16 11:46 AM  Result Value Ref Range   Glucose-Capillary 257 (H) 65 - 99 mg/dL  Glucose, capillary     Status: Abnormal   Collection Time: 04/19/16  4:58 PM  Result Value Ref Range   Glucose-Capillary 217 (H) 65 - 99 mg/dL  Glucose, capillary     Status: Abnormal   Collection Time: 04/19/16  8:46 PM  Result Value Ref Range   Glucose-Capillary 189 (H) 65 - 99 mg/dL  Glucose, capillary     Status: Abnormal   Collection Time: 04/20/16  8:16 AM  Result Value Ref Range   Glucose-Capillary 213 (H) 65 - 99 mg/dL  Glucose, capillary     Status: Abnormal   Collection Time: 04/20/16 12:20 PM  Result Value Ref Range   Glucose-Capillary 242 (H) 65 - 99 mg/dL  Glucose, capillary     Status: Abnormal   Collection Time: 04/20/16  5:07 PM  Result Value Ref Range   Glucose-Capillary 225 (H) 65 - 99 mg/dL  Glucose, capillary     Status: Abnormal   Collection Time: 04/20/16  8:57 PM  Result Value Ref Range   Glucose-Capillary 297 (H) 65 - 99 mg/dL  CBC     Status: Abnormal   Collection Time: 04/21/16  4:21 AM  Result Value Ref Range   WBC 9.2 4.0 - 10.5 K/uL   RBC 2.93 (L) 4.22 - 5.81 MIL/uL   Hemoglobin 7.7 (L) 13.0 - 17.0 g/dL   HCT 24.5 (L) 39.0 - 52.0 %   MCV 83.6 78.0 - 100.0 fL   MCH 26.3 26.0 - 34.0 pg   MCHC 31.4 30.0 - 36.0 g/dL   RDW 16.0 (H) 11.5 - 15.5 %   Platelets 373 150 - 400 K/uL  Comprehensive metabolic panel     Status: Abnormal   Collection Time: 04/21/16  4:21 AM  Result Value Ref Range   Sodium 135 135 -  145 mmol/L   Potassium 4.0 3.5 - 5.1 mmol/L   Chloride 98 (L) 101 - 111 mmol/L   CO2 24 22 - 32 mmol/L   Glucose, Bld 171 (H) 65 - 99 mg/dL   BUN 65 (H) 6 - 20 mg/dL   Creatinine, Ser 10.19  (H) 0.61 - 1.24 mg/dL   Calcium 8.4 (L) 8.9 - 10.3 mg/dL   Total Protein 6.8 6.5 - 8.1 g/dL   Albumin 2.8 (L) 3.5 - 5.0 g/dL   AST 21 15 - 41 U/L   ALT 43 17 - 63 U/L   Alkaline Phosphatase 60 38 - 126 U/L   Total Bilirubin 0.7 0.3 - 1.2 mg/dL   GFR calc non Af Amer 5 (L) >60 mL/min   GFR calc Af Amer 6 (L) >60 mL/min    Comment: (NOTE) The eGFR has been calculated using the CKD EPI equation. This calculation has not been validated in all clinical situations. eGFR's persistently <60 mL/min signify possible Chronic Kidney Disease.    Anion gap 13 5 - 15  Glucose, capillary     Status: Abnormal   Collection Time: 04/21/16  7:44 AM  Result Value Ref Range   Glucose-Capillary 204 (H) 65 - 99 mg/dL     Lipid Panel  No results found for: CHOL, TRIG, HDL, CHOLHDL, VLDL, LDLCALC, LDLDIRECT   Lab Results  Component Value Date   HGBA1C 6.7 (H) 04/13/2016     Lab Results  Component Value Date   CREATININE 10.19 (H) 04/21/2016    60 year old male with a history of CKD stage V, diabetes mellitus, hypertension, diastolic CHF, OSA presented with one-day history of shortness of breath. The patient woke up with shortness of breath on the morning of 04/11/2016. He complains of some chest discomfort with coughing. Denies any fevers, chills, nausea, vomiting, diarrhea, abdominal pain. However, the patient stated that he has had some dyspnea on exertion for the better part of one month prior to his decompensation on 04/11/2016. Upon presentation, the patient was noted to be hypoxic with oxygen saturation of 88% on room air. Chest x-ray showed right lower lobe and left upper lobe opacity with increased interstitial markings. The patient was started on ceftriaxone and azithromycin. Nephrology was consulted, and the patient was initiated on dialysis.  Hospital course Acute respiratory failure with hypoxia/Volume overload -Secondary to pulmonary edema and OSA; probable pneumonia Currently on room  air -Continue dialysis for fluid removal -HD on 7/22 (1L) and 7/24 (3L) 7/26   PCT 1.45-->1.63 , patient received azithromycin/Rocephin from 7/22-7/26 -repeat CXR after second HD--improving edema but persistent LUL opacity -Pulmonary hygiene    CKD stage V-->ESRD Patient presenting with progression of his CKD, uremic symptoms, mild hyperkalemia, and fluid overload. -Strict I/Os - making arrangements for OP dialysis in [place]    -d/c lisinopril -06/06/2015 echo EF 60-65%, grade 2 DD, no WMA making arrangements for OP dialysis in [place]     Acute on chronic diastolic CHF secondary to volume overload secondary to ESRD -Echocardiogram--EF 60-65%, moderate LAE, elevated LVEDP -Daily weights--?neg 10 lbs -Accurate I's and O's -Dialysis fluid removal -Continue metoprolol succinate -Continue hydralazine and Isordil ACE inhibitor and Lasix have been discontinued  CAP? -Check procalcitonin-->1.45-->1.63  azithromycin and ceftriaxone as PCT 1.45,patient received azithromycin/Rocephin from 7/22-7/26 -repeat CXR after second HD--improving edema but persistent LUL opacity -WBC stable but afebrile    Hypertension -Continue amlodipine, metoprolol succinate and hydralazine -anticipate BP improvement with continued HD  Anemia of CKD/iron deficiency anemia -  Iron saturation 7%, ferritin 160 -Iron supplementation -Continue Aranesp per nephrology  Diabetes mellitus type 2 -Hemoglobin A1c--6.7, patient on glipizide however discontinued due to end-stage renal disease Patient to continue with insulin NPH and continue Accu-Cheks He was instructed about close monitoring of his CBG and he voiced understanding   Depression -Continue Zoloft  Hyperkalemia -improved with HD  OSA -noncompliant with CPAP at home    Discharge Exam:   Blood pressure (!) 142/60, pulse 64, temperature 99.1 F (37.3 C), temperature source Oral, resp. rate 17, height _0  (1.727 m),  weight 110 kg (242 lb 8 oz), SpO2 98 %.   General:  Pt is alert, follows commands appropriately, not in acute distress  HEENT: No icterus, No thrush, No neck mass, [place]/AT  Cardiovascular: RRR, S1/S2, no rubs, no gallops  Respiratory: CTA bilaterally, no wheezing, no crackles, no rhonchi  Abdomen: Soft/+BS, non tender, non distended, no guarding  Extremities: No edema, No lymphangitis, No petechiae, No rashes, no synovitis    Follow-up Information    WEBB, CAROL D, MD. Schedule an appointment as soon as possible for a visit in 3 days.   Specialty:  Family Medicine Why:  hospital follow up; APPOINTMENT: MONDAY, 04-21-16 AT 11:30AM Contact information: [address] [place] [address] [phone]           Signed: Reyne Dumas 04/21/2016, 9:45 AM        Time spent >45 mins

## 2016-07-12 ENCOUNTER — Other Ambulatory Visit: Payer: Self-pay | Admitting: Cardiovascular Disease

## 2016-07-14 NOTE — Telephone Encounter (Signed)
Please review for refill. Thanks!  

## 2016-08-31 ENCOUNTER — Other Ambulatory Visit: Payer: Self-pay | Admitting: Cardiovascular Disease

## 2016-09-01 NOTE — Telephone Encounter (Signed)
Please review for refill. Thanks!  

## 2016-11-19 ENCOUNTER — Other Ambulatory Visit: Payer: Self-pay | Admitting: Cardiovascular Disease

## 2016-11-19 ENCOUNTER — Other Ambulatory Visit: Payer: Self-pay

## 2016-11-19 NOTE — Telephone Encounter (Signed)
Please review for refill. Thanks!  

## 2016-11-25 ENCOUNTER — Telehealth: Payer: Self-pay | Admitting: Cardiovascular Disease

## 2016-11-25 NOTE — Telephone Encounter (Signed)
New Message   *STAT* If patient is at the pharmacy, call can be transferred to refill team.   1. Which medications need to be refilled? (please list name of each medication and dose if known) Metoprolol 200mg    2. Which pharmacy/location (including street and city if local pharmacy) is medication to be sent to? [facility]   3. Do they need a 30 day or 90 day supply? [place]

## 2016-11-26 MED ORDER — METOPROLOL SUCCINATE ER 200 MG PO TB24: 200.0000 mg | ORAL_TABLET | Freq: Every day | ORAL | 0 refills | Status: DC

## 2016-11-26 NOTE — Telephone Encounter (Signed)
Rx sent electronically.  

## 2016-12-21 ENCOUNTER — Other Ambulatory Visit: Payer: Self-pay | Admitting: Cardiovascular Disease

## 2016-12-22 NOTE — Telephone Encounter (Signed)
Refill request

## 2016-12-29 NOTE — Progress Notes (Deleted)
Cardiology Office Note   Date:  12/29/2016   ID:  Micheal [name], DOB 09-Feb-1956, MRN [id number]  PCP:  Jonathon Bellows, MD  Cardiologist:   Skeet Latch, MD   No chief complaint on file.     History of Present Illness: Micheal [name] is a 61 y.o. male with hypertension, OSA, PVCs, CKD 4-5 and obesity who presents for follow up.  He initially saw Rosaria Ferries 05/2015 for an evaluation of palpitations. He wore an event monitor that revealed PVCs.  I saw him in clinic 06/2015 and carvedilol was switched to metoprolol. His blood pressure was poorly-controlled so he was referred to our hypertension clinic but did not follow-up. He was admitted to the hospital 03/2016 with hypoxic respiratory failure thought to be due to pneumonia and acute on chronic diastolic heart failure. Echo at that time revealed LVEF 78-29% ***diastolic function?  f/u exercise, CPAP   Past Medical History:  Diagnosis Date  . Acute respiratory failure with hypoxia ([place])   . Anemia   . CAP (community acquired pneumonia)   . Chronic diastolic heart failure ([place])   . Chronic kidney disease    he is s/p AVF but not on HD yet  . Diabetes mellitus   . Hypertension   . Obesity (BMI 30-39.9)   . OSA (obstructive sleep apnea)    severe with AHI 72/hr    Past Surgical History:  Procedure Laterality Date  . AV FISTULA PLACEMENT       Current Outpatient Prescriptions  Medication Sig Dispense Refill  . ALPRAZolam (XANAX) 0.25 MG tablet Take 1 tablet as needed 3 times a day for panic attacks  0  . amLODipine (NORVASC) 10 MG tablet Take 10 mg by mouth daily.    . calcitRIOL (ROCALTROL) 0.5 MCG capsule Take 1 capsule (0.5 mcg total) by mouth every Monday, Wednesday, and Friday with hemodialysis. 30 capsule 1  . calcium acetate (PHOSLO) 667 MG capsule Take by mouth 3 (three) times daily with meals.    . fluticasone (FLONASE) 50 MCG/ACT nasal spray Place 2 sprays into both nostrils daily.  5  . hydrALAZINE  (APRESOLINE) 25 MG tablet Take 1 tablet (25 mg total) by mouth 3 (three) times daily. 90 tablet 1  . insulin NPH Human (HUMULIN N,NOVOLIN N) 100 UNIT/ML injection Inject 0.15 mLs (15 Units total) into the skin 2 (two) times daily. 10 mL 11  . isosorbide dinitrate (ISORDIL) 30 MG tablet TAKE 1 TABLET BY MOUTH THREE TIMES DAILY 90 tablet 0  . metoprolol (TOPROL-XL) 200 MG 24 hr tablet Take 1 tablet (200 mg total) by mouth daily. PLEASE KEEP SCHEDULED APPOINTMENT 45 tablet 0  . Nutritional Supplements (FEEDING SUPPLEMENT, NEPRO CARB STEADY,) LIQD Take 237 mLs by mouth daily as needed (Offer if patient eats </=50% of meal). 30 Can 0  . Nutritional Supplements (FEEDING SUPPLEMENT, NEPRO CARB STEADY,) LIQD Take 237 mLs by mouth daily at 3 pm. 10 Can 0  . omega-3 acid ethyl esters (LOVAZA) 1 G capsule Take 1 g by mouth daily.      [name] Kitchen omeprazole (PRILOSEC) 20 MG capsule Take 20 mg by mouth daily.      . sertraline (ZOLOFT) 50 MG tablet Take 0.5 tablets by mouth daily.   0  . simvastatin (ZOCOR) 20 MG tablet Take 20 mg by mouth daily.       No current facility-administered medications for this visit.     Allergies:   Patient has no known allergies.  Social History:  The patient  reports that he has never smoked. He does not have any smokeless tobacco history on file. He reports that he does not drink alcohol or use drugs.   Family History:  The patient's family history includes Heart disease in his father; Heart failure in his father and other.    ROS:  Please see the history of present illness.   Otherwise, review of systems are positive for none.   All other systems are reviewed and negative.    PHYSICAL EXAM: VS:  There were no vitals taken for this visit. , BMI There is no height or weight on file to calculate BMI. GENERAL:  Well appearing HEENT:  Pupils equal round and reactive, fundi not visualized, oral mucosa unremarkable NECK:  No jugular venous distention, waveform within normal  limits, carotid upstroke brisk and symmetric, no bruits, no thyromegaly LYMPHATICS:  No cervical adenopathy LUNGS:  Clear to auscultation bilaterally HEART:  RRR.  PMI not displaced or sustained,S1 and S2 within normal limits, no S3, no S4, no clicks, no rubs, II/VI systolic murmur at the RUSB. ABD:  Flat, positive bowel sounds normal in frequency in pitch, no bruits, no rebound, no guarding, no midline pulsatile mass, no hepatomegaly, no splenomegaly EXT:  2 plus pulses throughout.  Thrill over L radial pulse.  No edema, no cyanosis no clubbing SKIN:  No rashes no nodules NEURO:  Cranial nerves II through XII grossly intact, motor grossly intact throughout PSYCH:  Cognitively intact, oriented to person place and time    EKG:  EKG is ordered today. The ekg ordered today demonstrates sinus rhythm at 63 bpm.    48 hour Holter 05/31/15: Quality: Fair. Baseline artifact. Predominant rhythm: sinus rhythm Pauses >2.5 seconds: 0 Occasional monomorphic PVCs. Patient did submit a symptom diary. He reported feeling skipped beats 7 time. Each time there was sinus rhythm with a PVC.  Echo 06/06/15: Study Conclusions  - Left ventricle: The cavity size was normal. Wall thickness was normal. Systolic function was normal. The estimated ejection fraction was in the range of 60% to 65%. Wall motion was normal; there were no regional wall motion abnormalities. Features are consistent with a pseudonormal left ventricular filling pattern, with concomitant abnormal relaxation and increased filling pressure (grade 2 diastolic dysfunction). - Aortic valve: There was no stenosis. - Mitral valve: There was no significant regurgitation. - Left atrium: The atrium was mildly dilated. - Right ventricle: The cavity size was normal. Systolic function was normal. - Right atrium: The atrium was mildly dilated. - Pulmonary arteries: No complete TR doppler jet so unable to estimate PA systolic  pressure. - Inferior vena cava: The vessel was normal in size. The respirophasic diameter changes were in the normal range (>= 50%), consistent with normal central venous pressure. - Pericardium, extracardiac: A trivial pericardial effusion was identified posterior to the heart.  Impressions:  - Normal LV size with EF 60-65%. Moderate diastolic dysfunction. Normal RV size and systolic function. No significant valvular abnormalities.   Recent Labs: 04/12/2016: B Natriuretic Peptide 140.1 04/21/2016: ALT 43; BUN 65; Creatinine, Ser 10.19; Hemoglobin 7.7; Platelets 373; Potassium 4.0; Sodium 135    Lipid Panel No results found for: CHOL, TRIG, HDL, CHOLHDL, VLDL, LDLCALC, LDLDIRECT    Wt Readings from Last 3 Encounters:  04/21/16 108.2 kg (238 lb 8.6 oz)  08/02/15 119.7 kg (264 lb)  06/27/15 117 kg (258 lb)      Other studies Reviewed: Additional studies/ records that were reviewed today  include: . Review of the above records demonstrates:  Please see elsewhere in the note.     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  His BP is poorly-controlled, so we discussed my hesitation with switching coreg to metoprolol.  However he wants to try something to stop the palpitations.  Therefore we have recommended stopping carvedilol and starting metoprolol succinate 200 mg daily. He does not qualify for antiarrhythmic therapy based on this low number of PVCs and no associated cardiomyopathy. Will, however, assess for ischemia with a treadmill stress test. This will also show if he has more PVCs with exertion.  # Hypertension: BP poorly-controlled as above.  Stopping carvedilol and starting metoprolol in an effort to reduce his PVC burden. He is to continue amlodipine, hydralazine, and lisinopril at current doses. If his blood pressure continues to be poorly controlled his hydralazine can be titrated. We are also adding isosorbide  dinitrate 20 mg 3 times a day.  We will have him follow up with our pharmacist, Tommy Medal, in 1 month for titration of his antihypertensives.  # Chronic diastolic heart failure:   Blood pressure is poorly controlled as above. We are adding isosorbide dinitrate, which may help with afterload reduction and his grade 2 diastolic dysfunction. He is to continue on his other antihypertensives as above. He appears she will limit on exam.   Current medicines are reviewed at length with the patient today.  The patient does not have concerns regarding medicines.  The following changes have been made:  Stop carvedilol and start metoprolol 200 mg daily.  Start isosorbide dinitrate 20mg  tid.  Labs/ tests ordered today include:   No orders of the defined types were placed in this encounter.    Disposition:   FU with Tommy Medal in 1 month and Rhonda Barrett in 6 months.   Signed, Skeet Latch, MD  12/29/2016 8:25 AM    [place]

## 2016-12-30 ENCOUNTER — Ambulatory Visit: Payer: 59 | Admitting: Cardiovascular Disease

## 2017-01-20 ENCOUNTER — Telehealth: Payer: Self-pay | Admitting: Cardiovascular Disease

## 2017-01-20 MED ORDER — METOPROLOL SUCCINATE ER 200 MG PO TB24: 200.0000 mg | ORAL_TABLET | Freq: Every day | ORAL | 0 refills | Status: DC

## 2017-01-20 NOTE — Telephone Encounter (Signed)
Refill sent to the pharmacy electronically.  

## 2017-01-20 NOTE — Telephone Encounter (Signed)
New message     *STAT* If patient is at the pharmacy, call can be transferred to refill team.   1. Which medications need to be refilled? (please list name of each medication and dose if known) metoprolol 200 mg  2. Which pharmacy/location (including street and city if local pharmacy) is medication to be sent to? [facility] on [address]   3. Do they need a 30 day or 90 day supply? 30 day

## 2017-01-27 ENCOUNTER — Ambulatory Visit (INDEPENDENT_AMBULATORY_CARE_PROVIDER_SITE_OTHER): Payer: 59 | Admitting: Cardiovascular Disease

## 2017-01-27 ENCOUNTER — Encounter: Payer: Self-pay | Admitting: Cardiovascular Disease

## 2017-01-27 VITALS — BP 130/84 | HR 76 | Ht 68.5 in | Wt 238.0 lb

## 2017-01-27 DIAGNOSIS — I10 Essential (primary) hypertension: Secondary | ICD-10-CM | POA: Diagnosis not present

## 2017-01-27 DIAGNOSIS — I493 Ventricular premature depolarization: Secondary | ICD-10-CM | POA: Diagnosis not present

## 2017-01-27 DIAGNOSIS — E7800 Pure hypercholesterolemia, unspecified: Secondary | ICD-10-CM

## 2017-01-27 NOTE — Progress Notes (Signed)
Cardiology Office Note   Date:  01/27/2017   ID:  JAYAN [name], DOB 07/29/1956, MRN [id number]  PCP:  Maurice Small, MD  Cardiologist:   Skeet Latch, MD   Chief Complaint  Patient presents with  . Follow-up    No chest pain, shortness of breath, edema, pain or cramping in legs, lightheaded or dizziness      History of Present Illness: Micheal [name] is a 61 y.o. male with hypertension, OSA, PVCs, ESRD on HD, and obesity who presents for follow up.  He initially saw Rosaria Ferries 05/2015 for an evaluation of palpitations. He wore an event monitor that revealed PVCs.  I saw him in clinic 06/2015 and carvedilol was switched to metoprolol. His blood pressure was poorly-controlled so he was referred to our hypertension clinic but did not follow-up. He was admitted to the hospital 03/2016 with hypoxic respiratory failure thought to be due to pneumonia and acute on chronic diastolic heart failure. Echo at that time revealed LVEF 60-65%.  Since his last appointment Mr. Traweek started on hemodialysis 03/2016. He goes MWF.  H he reports that he is not getting any exercise. His breathing has been stable and he denies lower extremity edema, orthopnea, or PND. He also denies chest pain. His blood pressure is typically well-controlled. He does not taken his blood pressure medication on the mornings prior to hemodialysis. Sometimes it will run a little bit elevated before dialysis. However, when he tried taking the medication before HD his blood pressure got low during the session.   Past Medical History:  Diagnosis Date  . Acute respiratory failure with hypoxia ([place])   . Anemia   . CAP (community acquired pneumonia)   . Chronic diastolic heart failure ([place])   . Chronic kidney disease    he is s/p AVF but not on HD yet  . Diabetes mellitus   . Hypertension   . Obesity (BMI 30-39.9)   . OSA (obstructive sleep apnea)    severe with AHI 72/hr    Past Surgical History:  Procedure Laterality  Date  . AV FISTULA PLACEMENT       Current Outpatient Prescriptions  Medication Sig Dispense Refill  . amLODipine (NORVASC) 10 MG tablet Take 10 mg by mouth daily.    . calcitRIOL (ROCALTROL) 0.5 MCG capsule Take 1 capsule (0.5 mcg total) by mouth every Monday, Wednesday, and Friday with hemodialysis. 30 capsule 1  . calcium acetate (PHOSLO) 667 MG capsule Take by mouth 3 (three) times daily with meals.    . fluticasone (FLONASE) 50 MCG/ACT nasal spray Place 2 sprays into both nostrils daily.  5  . hydrALAZINE (APRESOLINE) 25 MG tablet Take 1 tablet (25 mg total) by mouth 3 (three) times daily. 90 tablet 1  . insulin NPH Human (HUMULIN N,NOVOLIN N) 100 UNIT/ML injection Inject 0.15 mLs (15 Units total) into the skin 2 (two) times daily. 10 mL 11  . isosorbide dinitrate (ISORDIL) 30 MG tablet TAKE 1 TABLET BY MOUTH THREE TIMES DAILY 90 tablet 0  . metoprolol (TOPROL-XL) 200 MG 24 hr tablet Take 1 tablet (200 mg total) by mouth daily. PLEASE KEEP SCHEDULED APPOINTMENT 45 tablet 0  . omega-3 acid ethyl esters (LOVAZA) 1 G capsule Take 1 g by mouth daily.      [name] Kitchen omeprazole (PRILOSEC) 20 MG capsule Take 20 mg by mouth daily as needed.     . sertraline (ZOLOFT) 50 MG tablet Take 0.5 tablets by mouth daily.   0  .  simvastatin (ZOCOR) 20 MG tablet Take 20 mg by mouth daily.       No current facility-administered medications for this visit.     Allergies:   Patient has no known allergies.    Social History:  The patient  reports that he has never smoked. He has never used smokeless tobacco. He reports that he does not drink alcohol or use drugs.   Family History:  The patient's family history includes Heart disease in his father; Heart failure in his father and other.    ROS:  Please see the history of present illness.   Otherwise, review of systems are positive for none.   All other systems are reviewed and negative.    PHYSICAL EXAM: VS:  BP 130/84   Pulse 76   Ht 5' 8.5" (1.74 m)   Wt  108 kg (238 lb)   BMI 35.66 kg/m  , BMI Body mass index is 35.66 kg/m. GENERAL:  Well appearing HEENT:  Pupils equal round and reactive, fundi not visualized, oral mucosa unremarkable NECK:  No jugular venous distention, waveform within normal limits, carotid upstroke brisk and symmetric, no bruits LYMPHATICS:  No cervical adenopathy LUNGS:  Clear to auscultation bilaterally HEART:  RRR.  PMI not displaced or sustained,S1 and S2 within normal limits, no S3, no S4, no clicks, no rubs, II/VI systolic murmur at the LUSB. ABD:  Flat, positive bowel sounds normal in frequency in pitch, no bruits, no rebound, no guarding, no midline pulsatile mass, no hepatomegaly, no splenomegaly EXT:  2 plus pulses throughout. Fistula in L UE. Thrill over L radial pulse.  No edema, no cyanosis no clubbing SKIN:  No rashes no nodules NEURO:  Cranial nerves II through XII grossly intact, motor grossly intact throughout PSYCH:  Cognitively intact, oriented to person place and time   EKG:  EKG is ordered today. The ekg ordered today demonstrates sinus rhythm at 63 bpm.   01/27/17:  Sinus rhythm. Rate 76 bpm. Right axis deviation.  48 hour Holter 05/31/15: Quality: Fair. Baseline artifact. Predominant rhythm: sinus rhythm Pauses >2.5 seconds: 0 Occasional monomorphic PVCs. Patient did submit a symptom diary. He reported feeling skipped beats 7 time. Each time there was sinus rhythm with a PVC.  Echo 06/06/15: Study Conclusions  - Left ventricle: The cavity size was normal. Wall thickness was normal. Systolic function was normal. The estimated ejection fraction was in the range of 60% to 65%. Wall motion was normal; there were no regional wall motion abnormalities. Features are consistent with a pseudonormal left ventricular filling pattern, with concomitant abnormal relaxation and increased filling pressure (grade 2 diastolic dysfunction). - Aortic valve: There was no stenosis. - Mitral valve:  There was no significant regurgitation. - Left atrium: The atrium was mildly dilated. - Right ventricle: The cavity size was normal. Systolic function was normal. - Right atrium: The atrium was mildly dilated. - Pulmonary arteries: No complete TR doppler jet so unable to estimate PA systolic pressure. - Inferior vena cava: The vessel was normal in size. The respirophasic diameter changes were in the normal range (>= 50%), consistent with normal central venous pressure. - Pericardium, extracardiac: A trivial pericardial effusion was identified posterior to the heart.  Impressions:  - Normal LV size with EF 60-65%. Moderate diastolic dysfunction. Normal RV size and systolic function. No significant valvular abnormalities.   Recent Labs: 04/12/2016: B Natriuretic Peptide 140.1 04/21/2016: ALT 43; BUN 65; Creatinine, Ser 10.19; Hemoglobin 7.7; Platelets 373; Potassium 4.0; Sodium 135  4/ 19/18:  sodium 136, potassium 4.5, BUN 46, creatinine 7.38  AST 15, ALT 16  total cholesterol 192, triglycerides 253, HDL 2, LDL 47   Lipid Panel No results found for: CHOL, TRIG, HDL, CHOLHDL, VLDL, LDLCALC, LDLDIRECT    Wt Readings from Last 3 Encounters:  01/27/17 108 kg (238 lb)  04/21/16 108.2 kg (238 lb 8.6 oz)  08/02/15 119.7 kg (264 lb)      Other studies Reviewed: Additional studies/ records that were reviewed today include: . Review of the above records demonstrates:  Please see elsewhere in the note.     ASSESSMENT AND PLAN:  # PVCs:  Symptoms are better controlled on metoprolol. No changes.  # Hypertension: BP  Well-controlled. Continue amlodipine,hydralazine , metoprolol, and Imdur.  # Hyperlipidemia: LDL was 47 when checked  12/2016. Triglycerides are elevated. Continue simvastatin and Lovaza.  # Chronic diastolic heart failure:   Blood pressure is much better controlled.   Symptoms have improved sine starting dialysis.  # Obesity: Mr. Veronica he was  encouraged to start exercising 90-150 minutes weekly  Current medicines are reviewed at length with the patient today.  The patient does not have concerns regarding medicines.  The following changes have been made:  No changes  Labs/ tests ordered today include:   No orders of the defined types were placed in this encounter.    Disposition:   FU with Vicki Pasqual C. Oval Linsey, MD, [facility] in 1 year.  Signed, Skeet Latch, MD  01/27/2017 12:33 PM    [facility]

## 2017-01-27 NOTE — Patient Instructions (Signed)

## 2017-02-08 ENCOUNTER — Other Ambulatory Visit: Payer: Self-pay | Admitting: Cardiovascular Disease

## 2017-02-09 NOTE — Telephone Encounter (Signed)
Rx(s) sent to pharmacy electronically.  

## 2017-02-09 NOTE — Telephone Encounter (Signed)
Refill Request.  

## 2017-04-10 ENCOUNTER — Telehealth: Payer: Self-pay

## 2017-04-10 MED ORDER — METOPROLOL SUCCINATE ER 200 MG PO TB24: 200.0000 mg | ORAL_TABLET | Freq: Every day | ORAL | 3 refills | Status: DC

## 2017-04-10 NOTE — Telephone Encounter (Signed)
Patient was seen on 02/06/17. Patient has requested refill on metoprolol.   Will send in refill for one year supply.

## 2017-04-22 HISTORY — PX: PERCUTANEOUS CORONARY STENT INTERVENTION (PCI-S): SHX6016

## 2017-11-28 ENCOUNTER — Other Ambulatory Visit: Payer: Self-pay | Admitting: Cardiovascular Disease

## 2017-11-30 NOTE — Telephone Encounter (Signed)
Please review for refill, Thanks !  

## 2018-03-03 ENCOUNTER — Other Ambulatory Visit: Payer: Self-pay | Admitting: Cardiovascular Disease

## 2018-04-28 ENCOUNTER — Other Ambulatory Visit: Payer: Self-pay | Admitting: Cardiovascular Disease

## 2018-05-19 ENCOUNTER — Other Ambulatory Visit: Payer: Self-pay

## 2018-05-19 MED ORDER — METOPROLOL SUCCINATE ER 200 MG PO TB24: 200.0000 mg | ORAL_TABLET | Freq: Every day | ORAL | 0 refills | Status: DC

## 2018-06-14 ENCOUNTER — Other Ambulatory Visit: Payer: Self-pay | Admitting: Cardiovascular Disease

## 2018-07-16 ENCOUNTER — Other Ambulatory Visit: Payer: Self-pay | Admitting: Cardiovascular Disease

## 2018-08-18 ENCOUNTER — Other Ambulatory Visit: Payer: Self-pay | Admitting: Cardiovascular Disease

## 2018-09-28 ENCOUNTER — Encounter: Payer: Self-pay | Admitting: Internal Medicine

## 2018-09-28 ENCOUNTER — Ambulatory Visit (INDEPENDENT_AMBULATORY_CARE_PROVIDER_SITE_OTHER): Payer: 59 | Admitting: Internal Medicine

## 2018-09-28 VITALS — BP 152/68 | HR 75 | Ht 68.0 in | Wt 241.0 lb

## 2018-09-28 DIAGNOSIS — N186 End stage renal disease: Secondary | ICD-10-CM

## 2018-09-28 DIAGNOSIS — Z794 Long term (current) use of insulin: Secondary | ICD-10-CM

## 2018-09-28 DIAGNOSIS — Z992 Dependence on renal dialysis: Secondary | ICD-10-CM | POA: Diagnosis not present

## 2018-09-28 DIAGNOSIS — E1122 Type 2 diabetes mellitus with diabetic chronic kidney disease: Secondary | ICD-10-CM

## 2018-09-28 MED ORDER — FREESTYLE LIBRE 14 DAY SENSOR MISC: 1.0000 | Freq: Four times a day (QID) | 6 refills | Status: DC

## 2018-09-28 MED ORDER — ATORVASTATIN CALCIUM 40 MG PO TABS: 40.0000 mg | ORAL_TABLET | Freq: Every day | ORAL | 3 refills | Status: DC

## 2018-09-28 MED ORDER — GLUCOSE BLOOD VI STRP
ORAL_STRIP | 12 refills | Status: AC
Start: 2018-09-28 — End: ?

## 2018-09-28 MED ORDER — BASAGLAR KWIKPEN 100 UNIT/ML ~~LOC~~ SOPN: 25.0000 [IU] | PEN_INJECTOR | Freq: Every day | SUBCUTANEOUS | 6 refills | Status: DC

## 2018-09-28 MED ORDER — INSULIN PEN NEEDLE 31G X 5 MM MISC: 11 refills | Status: DC

## 2018-09-28 MED ORDER — INSULIN LISPRO (1 UNIT DIAL) 100 UNIT/ML (KWIKPEN): 8.0000 [IU] | PEN_INJECTOR | Freq: Three times a day (TID) | SUBCUTANEOUS | 11 refills | Status: DC

## 2018-09-28 NOTE — Patient Instructions (Addendum)
-   STOP Novolin- N  - Start Basaglar 25 units at bedtime - Start Humalog 8 units three times a day with meal  - Check sugar 4 times a day (before meals and bedtime)  - HOW TO TREAT LOW BLOOD SUGARS (Blood sugar LESS THAN 70 MG/DL)  Please follow the RULE OF 15 for the treatment of hypoglycemia treatment (when your (blood sugars are less than 70 mg/dL)    STEP 1: Take 15 grams of carbohydrates when your blood sugar is low, which includes:   3-4 GLUCOSE TABS  OR  3-4 OZ OF JUICE OR REGULAR SODA OR  ONE TUBE OF GLUCOSE GEL     STEP 2: RECHECK blood sugar in 15 MINUTES STEP 3: If your blood sugar is still low at the 15 minute recheck --> then, go back to STEP 1 and treat AGAIN with another 15 grams of carbohydrates.

## 2018-09-28 NOTE — Progress Notes (Signed)
Name: Micheal [name]  MRN/ DOB: [id number], [birth year]   Age/ Sex: 63 y.o., male    PCP: Maurice Small, MD   Reason for Endocrinology Evaluation: Type 2 Diabetes Mellitus     Date of Initial Endocrinology Visit: 09/28/2018     PATIENT IDENTIFIER: Micheal [name] is a 63 y.o. male with a past medical history of DM, ESRD on HD (started on 03/2016), HTN, OSA. The patient presented for initial endocrinology clinic visit on 09/28/2018 for consultative assistance with his diabetes management.    HPI: Micheal [name] is on the transplant list through [facility]   Diagnosed with T2DM 1992 Prior Medications tried/Intolerance: Metformin, glipizide, has been on insulin for years Currently checking blood sugars 0 x / day  Hypoglycemia episodes : no                Hemoglobin A1c has ranged from 6.7% in 2017, peaking at 12.3 %in 07/2018 Patient required assistance for hypoglycemia: no Patient has required hospitalization within the last 1 year from hyper or hypoglycemia:   In terms of diet, the patient drinks regular soda and juice. Eats 2-3 meals. Snacks on average 2x a day   Gets meds through  [place]: Novolin- N 40 units BID Morning and bedtime    Statin: Yes ACE-I/ARB: No Prior Diabetic Education: No   METER DOWNLOAD SUMMARY: Did not bring    DIABETIC COMPLICATIONS: Microvascular complications:   CKD   Denies: retinopathy , neuropathy   Last eye exam: Completed 2019  Macrovascular complications:   CAD   Denies: PVD, CVA   PAST HISTORY: Past Medical History:  Past Medical History:  Diagnosis Date  . Acute respiratory failure with hypoxia ([place])   . Anemia   . CAP (community acquired pneumonia)   . Chronic diastolic heart failure ([place])   . Chronic kidney disease    he is s/p AVF but not on HD yet  . Diabetes mellitus   . Hypertension   . Obesity (BMI 30-39.9)   . OSA (obstructive sleep apnea)    severe with AHI 72/hr    Past Surgical History:    Past Surgical History:  Procedure Laterality Date  . AV FISTULA PLACEMENT        Social History:  reports that he has never smoked. He has never used smokeless tobacco. He reports that he does not drink alcohol or use drugs. Family History:  Family History  Problem Relation Age of Onset  . Heart failure Father   . Heart disease Father   . Heart failure Other       HOME MEDICATIONS: Allergies as of 09/28/2018   No Known Allergies     Medication List       Accurate as of September 28, 2018  3:28 PM. Always use your most recent med list.        amLODipine 10 MG tablet Commonly known as:  NORVASC Take 10 mg by mouth daily.   calcitRIOL 0.5 MCG capsule Commonly known as:  ROCALTROL Take 1 capsule (0.5 mcg total) by mouth every Monday, Wednesday, and Friday with hemodialysis.   calcium acetate 667 MG capsule Commonly known as:  PHOSLO Take by mouth 3 (three) times daily with meals.   fluticasone 50 MCG/ACT nasal spray Commonly known as:  FLONASE Place 2 sprays into both nostrils daily.   hydrALAZINE 25 MG tablet Commonly known as:  APRESOLINE Take 1 tablet (25 mg total) by mouth 3 (three)  times daily.   insulin NPH Human 100 UNIT/ML injection Commonly known as:  HUMULIN N,NOVOLIN N Inject 0.15 mLs (15 Units total) into the skin 2 (two) times daily.   isosorbide dinitrate 30 MG tablet Commonly known as:  ISORDIL Take 1 tablet (30 mg total) by mouth 3 (three) times daily. Please call and schedule office visit for further refills 1st attempt   metoprolol 200 MG 24 hr tablet Commonly known as:  TOPROL-XL Take 1 tablet (200 mg total) by mouth daily. Please call and schedule office visit for further refills 1st attempt   omega-3 acid ethyl esters 1 g capsule Commonly known as:  LOVAZA Take 1 g by mouth daily.   omeprazole 20 MG capsule Commonly known as:  PRILOSEC Take 20 mg by mouth daily as needed.   sertraline 50 MG tablet Commonly known as:  ZOLOFT Take 0.5  tablets by mouth daily.   simvastatin 20 MG tablet Commonly known as:  ZOCOR Take 20 mg by mouth daily.        ALLERGIES: No Known Allergies   REVIEW OF SYSTEMS: A comprehensive ROS was conducted with the patient and is negative except as per HPI and below:  Review of Systems  Constitutional: Negative for malaise/fatigue and weight loss.  HENT: Negative for congestion and sore throat.   Eyes: Negative for blurred vision and pain.  Respiratory: Negative for cough and shortness of breath.   Cardiovascular: Negative for chest pain and palpitations.  Gastrointestinal: Negative for diarrhea and nausea.  Genitourinary: Negative for frequency.  Skin: Negative.   Neurological: Negative for tingling and tremors.  Endo/Heme/Allergies: Negative for polydipsia.  Psychiatric/Behavioral: Negative for depression. The patient is not nervous/anxious.       OBJECTIVE:   VITAL SIGNS: BP (!) 152/68 (BP Location: Right Arm, Patient Position: Sitting, Cuff Size: Large)   Pulse 75   Ht 5\' 8"  (1.727 m)   Wt 241 lb (109.3 kg)   SpO2 98%   BMI 36.64 kg/m    PHYSICAL EXAM:  General: Pt appears well and is in NAD  Hydration: Well-hydrated with moist mucous membranes and good skin turgor  HEENT: Head: Unremarkable with good dentition. Oropharynx clear without exudate.  Eyes: External eye exam normal without stare, lid lag or exophthalmos.  EOM intact.  PERRL.  Neck: General: Supple without adenopathy or carotid bruits. Thyroid: Thyroid size normal.  No goiter or nodules appreciated. No thyroid bruit.  Lungs: Clear with good BS bilat with no rales, rhonchi, or wheezes  Heart: RRR with normal S1 and S2 and no gallops; no murmurs; no rub  Abdomen: Normoactive bowel sounds, soft, nontender, without masses or organomegaly palpable  Extremities:  Lower extremities - No pretibial edema. No lesions.  Skin: Normal texture and temperature to palpation. No rash noted. No Acanthosis nigricans/skin tags.  No lipohypertrophy.  Neuro: MS is good with appropriate affect, pt is alert and Ox3    DM foot exam:  The skin of the feet is intact without sores or ulcerations. The pedal pulses are 2+ on right and 2+ on left. The sensation is intact to a screening 5.07, 10 gram monofilament bilaterally   DATA REVIEWED: 08/18/18  A1c 12.3% BUN/Cr 32/7.29 mg/dL Na 137 mmol/L K 4.2 mmol/L TG 247 mg/dL  LDL 40 mg/dL   ASSESSMENT / PLAN / RECOMMENDATIONS:   1) Type 2 Diabetes Mellitus, Poorly controlled, With Renal complications (ESRD on HD)- Most recent A1c of 12.3 %. Goal A1c < 7.0 %.    Plan:  GENERAL: - I have discussed with the patient the pathophysiology of diabetes. We went over the natural progression of the disease. We talked about both insulin resistance and insulin deficiency. We stressed the importance of lifestyle changes including diet and exercise. I explained the complications associated with diabetes including retinopathy, nephropathy, neuropathy as well as increased risk of cardiovascular disease. We went over the benefit seen with glycemic control.   - I explained to the patient that diabetic patients are at higher than normal risk for amputations. The patient was informed that diabetes is the number one cause of non-traumatic amputations in [place].    - Discussed pharmacokinetics of basal/bolus insulin and the importance of taking prandial insulin with meals and that being on basal insulin only, is never going to keep his glucose under good control.  We also discussed avoiding sugar-sweetened beverages and snacks, when possible.  - We also discussed our limitations in oral glycemic agents due to ESRD.    MEDICATIONS:  STOP Novolin_N  Start Basaglar 25 units at bedtime- Provided in hand   Start Humalog 8 units three times a day with meal - prescription provided in hand to take to the [place]  He was provided with a prescription for freestyle libre   EDUCATION /  INSTRUCTIONS:  BG monitoring instructions: Patient is instructed to check his blood sugars 4 times a day, before meals and bedtime.  Call [facility] if: BG persistently < 70 or > 300. . I reviewed the Rule of 15 for the treatment of hypoglycemia in detail with the patient. Literature supplied.   2) Diabetic complications:   Eye: Does not have known diabetic retinopathy.   Neuro/ Feet: Does not have known diabetic peripheral neuropathy.  Renal: Patient does  have known baseline CKD.    3) Lipids: Patient is on a statin.    4) Hypertension: He is above goal of < 140/90 mmHg. Will defer to PCP.    45 min was spent with the patient, > 50% was spent in counseling and answering questions.    F/u 6 weeks   Signed electronically by: Mack Guise, MD  [facility]  [facility] [address], [place] [address] Phone: [phone] FAX: [phone]   CC: Maurice Small, MD [address] [address] Phone: [phone]  Fax: [phone]    Return to Endocrinology clinic as below: No future appointments.

## 2018-11-09 ENCOUNTER — Ambulatory Visit: Payer: 59 | Admitting: Internal Medicine

## 2018-11-16 ENCOUNTER — Ambulatory Visit (INDEPENDENT_AMBULATORY_CARE_PROVIDER_SITE_OTHER): Payer: 59 | Admitting: Internal Medicine

## 2018-11-16 ENCOUNTER — Encounter: Payer: Self-pay | Admitting: Internal Medicine

## 2018-11-16 VITALS — BP 148/72 | HR 72 | Ht 68.0 in | Wt 239.4 lb

## 2018-11-16 DIAGNOSIS — N186 End stage renal disease: Secondary | ICD-10-CM

## 2018-11-16 DIAGNOSIS — Z992 Dependence on renal dialysis: Secondary | ICD-10-CM | POA: Diagnosis not present

## 2018-11-16 DIAGNOSIS — E1122 Type 2 diabetes mellitus with diabetic chronic kidney disease: Secondary | ICD-10-CM | POA: Diagnosis not present

## 2018-11-16 DIAGNOSIS — Z794 Long term (current) use of insulin: Secondary | ICD-10-CM | POA: Diagnosis not present

## 2018-11-16 LAB — POCT GLYCOSYLATED HEMOGLOBIN (HGB A1C): Hemoglobin A1C: 9.6 % — AB (ref 4.0–5.6)

## 2018-11-16 MED ORDER — INSULIN LISPRO (1 UNIT DIAL) 100 UNIT/ML (KWIKPEN): 10.0000 [IU] | PEN_INJECTOR | Freq: Three times a day (TID) | SUBCUTANEOUS | 11 refills | Status: DC

## 2018-11-16 MED ORDER — BASAGLAR KWIKPEN 100 UNIT/ML ~~LOC~~ SOPN: 34.0000 [IU] | PEN_INJECTOR | Freq: Every day | SUBCUTANEOUS | 6 refills | Status: DC

## 2018-11-16 NOTE — Patient Instructions (Addendum)
-   Increase Basaglar to 34 units daily  - Increase Humalog to 10 units WITH each meal  - If your (before -meal) sugar is 200 or more take 12 units of Humalog with your meal.    HOW TO TREAT LOW BLOOD SUGARS (Blood sugar LESS THAN 70 MG/DL)  Please follow the RULE OF 15 for the treatment of hypoglycemia treatment (when your (blood sugars are less than 70 mg/dL)    STEP 1: Take 15 grams of carbohydrates when your blood sugar is low, which includes:   3-4 GLUCOSE TABS  OR  3-4 OZ OF JUICE OR REGULAR SODA OR  ONE TUBE OF GLUCOSE GEL     STEP 2: RECHECK blood sugar in 15 MINUTES STEP 3: If your blood sugar is still low at the 15 minute recheck --> then, go back to STEP 1 and treat AGAIN with another 15 grams of carbohydrates.

## 2018-11-16 NOTE — Progress Notes (Signed)
Name: Micheal [name]  Age/ Sex: 63 y.o., male   MRN/ DOB: [id number], 1955-12-20     PCP: Maurice Small, MD   Reason for Endocrinology Evaluation: Type 2 Diabetes Mellitus  Initial Endocrine Consultative Visit: 09/28/2018    PATIENT IDENTIFIER: [name] is a 63 y.o. male with a past medical history of DM, ESRD on HD (started on 03/2016), HTN, OSA. The patient has followed with Endocrinology clinic since 09/28/2018 for consultative assistance with management of his diabetes.  DIABETIC HISTORY:  Micheal [name] was diagnosed with T2DM 1992,has tried Metformin and  Glipizide in the past. Started insulin therapy years ago. His hemoglobin A1c has ranged from 6.7% in 2017, peaking at 12.3 %in 07/2018 .   SUBJECTIVE:   During the last visit (09/28/18): His A1c was 12.3 %. We stopped mixed insulin. We started basaglar and humalog, we also sent a prescription for freestyle libre.   Today (11/16/2018): Micheal [name] is here for a 4 weeks follow up on his diabetes.  He checks his blood sugars 1-2 times daily, through the freestyle libre. The patient has not had hypoglycemic episodes since the last clinic visit. Otherwise, the patient has not required any recent emergency interventions for hypoglycemia and has not had recent hospitalizations secondary to hyper or hypoglycemic episodes.    ROS: As per HPI and as detailed below: Review of Systems  Constitutional: Negative for fever.  HENT: Negative for congestion and sore throat.   Respiratory: Negative for cough and shortness of breath.   Cardiovascular: Negative for chest pain and palpitations.  Gastrointestinal: Negative for diarrhea and nausea.      HOME DIABETES REGIMEN:   Basaglar 28 units at bedtime  Humalog 8-10 units three times a day with meal      CONTINUOUS GLUCOSE MONITORING RECORD INTERPRETATION    Dates of Recording: 2/11-2/25/20  Sensor  description: freestyle libre   Results statistics:   CGM use % of time < 50  Average and SD 219  Time in range     12   %  % Time Above 180 47  % Time above 250 38  % Time Below target 0    Glycemic patterns summary:  Hyperglycemia through the day   Hyperglycemic episodes    Hypoglycemic episodes occurred none  Overnight periods: no saved data for overnight     HISTORY:  Past Medical History:  Past Medical History:  Diagnosis Date  . Acute respiratory failure with hypoxia ([place])   . Anemia   . CAP (community acquired pneumonia)   . Chronic diastolic heart failure ([place])   . Chronic kidney disease    he is s/p AVF but not on HD yet  . Diabetes mellitus   . Hypertension   . Obesity (BMI 30-39.9)   . OSA (obstructive sleep apnea)    severe with AHI 72/hr   Past Surgical History:  Past Surgical History:  Procedure Laterality Date  . AV FISTULA PLACEMENT    . PERCUTANEOUS CORONARY STENT INTERVENTION (PCI-S)  04/2017    Social History:  reports that he has never smoked. He has never used smokeless tobacco. He reports that he does not drink alcohol or use drugs. Family History:  Family History  Problem Relation Age of Onset  . Heart failure Father   . Heart disease Father   . Heart failure Other      HOME MEDICATIONS: Allergies as of 11/16/2018   No Known Allergies     Medication List  Accurate as of November 16, 2018  1:17 PM. Always use your most recent med list.        amLODipine 10 MG tablet Commonly known as:  NORVASC Take 10 mg by mouth daily.   atorvastatin 40 MG tablet Commonly known as:  LIPITOR Take 1 tablet (40 mg total) by mouth daily.   BASAGLAR KWIKPEN 100 UNIT/ML Sopn Inject 0.25 mLs (25 Units total) into the skin at bedtime.   calcitRIOL 0.5 MCG capsule Commonly known as:  ROCALTROL Take 1 capsule (0.5 mcg total) by mouth every Monday, Wednesday, and Friday with hemodialysis.   calcium acetate 667 MG capsule Commonly known  as:  PHOSLO Take by mouth 3 (three) times daily with meals.   fluticasone 50 MCG/ACT nasal spray Commonly known as:  FLONASE Place 2 sprays into both nostrils daily.   FREESTYLE LIBRE 14 DAY SENSOR Misc 1 kit by Does not apply route 4 (four) times daily.   glucose blood test strip Commonly known as:  ONETOUCH VERIO Four times daily   hydrALAZINE 25 MG tablet Commonly known as:  APRESOLINE Take 1 tablet (25 mg total) by mouth 3 (three) times daily.   insulin lispro 100 UNIT/ML KwikPen Commonly known as:  HUMALOG KWIKPEN Inject 0.08 mLs (8 Units total) into the skin 3 (three) times daily.   Insulin Pen Needle 31G X 5 MM Misc Commonly known as:  B-D UF III MINI PEN NEEDLES Four times a day   isosorbide dinitrate 30 MG tablet Commonly known as:  ISORDIL Take 1 tablet (30 mg total) by mouth 3 (three) times daily. Please call and schedule office visit for further refills 1st attempt   metoprolol 200 MG 24 hr tablet Commonly known as:  TOPROL-XL Take 1 tablet (200 mg total) by mouth daily. Please call and schedule office visit for further refills 1st attempt   omega-3 acid ethyl esters 1 g capsule Commonly known as:  LOVAZA Take 1 g by mouth daily.   omeprazole 20 MG capsule Commonly known as:  PRILOSEC Take 20 mg by mouth daily as needed.   sertraline 50 MG tablet Commonly known as:  ZOLOFT Take 0.5 tablets by mouth daily.        OBJECTIVE:   Vital Signs: BP (!) 148/72 (BP Location: Left Arm, Patient Position: Sitting, Cuff Size: Normal)   Pulse 72   Ht '5\' 8"'  (1.727 m)   Wt 239 lb 6.4 oz (108.6 kg)   SpO2 97%   BMI 36.40 kg/m   Wt Readings from Last 3 Encounters:  11/16/18 239 lb 6.4 oz (108.6 kg)  09/28/18 241 lb (109.3 kg)  01/27/17 238 lb (108 kg)     Exam: General: Pt appears well and is in NAD  Hydration: Well-hydrated with moist mucous membranes and good skin turgor  HEENT: Head: Unremarkable with good dentition. Oropharynx clear without exudate.    Eyes: External eye exam normal without stare, lid lag or exophthalmos.  EOM intact.  PERRL.  Neck: General: Supple without adenopathy. Thyroid: Thyroid size normal.  No goiter or nodules appreciated. No thyroid bruit.  Lungs: Clear with good BS bilat with no rales, rhonchi, or wheezes  Heart: RRR with normal S1 and S2 and no gallops; no murmurs; no rub  Abdomen: Normoactive bowel sounds, soft, nontender, without masses or organomegaly palpable  Extremities: No pretibial edema. No tremor. Normal strength and motion throughout. See detailed diabetic foot exam below.  Skin: Normal texture and temperature to palpation. No rash noted. No Acanthosis  nigricans/skin tags. No lipohypertrophy.  Neuro: MS is good with appropriate affect, pt is alert and Ox3        DM foot exam: 09/28/2018 The skin of the feet is intact without sores or ulcerations. The pedal pulses are 2+ on right and 2+ on left. The sensation is intact to a screening 5.07, 10 gram monofilament bilaterally    DATA REVIEWED: 08/18/18  A1c 12.3% BUN/Cr 32/7.29 mg/dL Na 137 mmol/L K 4.2 mmol/L TG 247 mg/dL  LDL 40 mg/dL   Results for Micheal [name], Micheal [name] (MRN [id number]) as of 11/16/2018 13:17  Ref. Range 11/16/2018 13:06  Hemoglobin A1C Latest Ref Range: 4.0 - 5.6 % 9.6 (A)       ASSESSMENT / PLAN / RECOMMENDATIONS:   1) Type 2 Diabetes Mellitus, Poorly controlled, With Renal complications (ESRD on HD)- Most recent A1c of 9.6 %. Goal A1c < 7.0 %.    Plan:  -Patient has been doing well on MDI insulin regimen, we have stopped his insulin mix on the last visit. -Patient advised to check his glucose with the freestyle libre at least every 8 hours, he was advised that if he does not check it as frequent there will not be any data saved, and review of his freestyle libre on the phone today it looks like he does not always check his BG's at night. -Patient also admits to sometimes not remembering to take his Humalog prior to the  meal, and ends up taking it sometimes 30 to an hour after meal, patient strongly discouraged from using prandial insulin after meals due to the risk of hypoglycemia. -Despite his persistent hyperglycemia at this time, he is still doing better that he was in November, 2019 as his A1c went down from 12.3% to 9.6%. -Patient encouraged to continue with glucose checks and taking insulin as prescribed.   MEDICATIONS:  Increase Basaglar to 34 units daily  Increase Humalog to 10 units 3 times daily with meals  If pre-meal BG is 200 over take 12 units of Humalog with meals  EDUCATION / INSTRUCTIONS:  BG monitoring instructions: Patient is instructed to check his blood sugars 4 times a day, before each meal and bedtime.  Call [facility] if: BG persistently < 70 or > 300. . I reviewed the Rule of 15 for the treatment of hypoglycemia in detail with the patient. Literature supplied.     F/U in 6 weeks   Signed electronically by: Mack Guise, MD  [facility]  [facility] [address] [place], [place] [address] Phone: [phone] FAX: [phone]   CC: Maurice Small, MD [place] [address] [address] Phone: [phone]  Fax: [phone]  Return to Endocrinology clinic as below: No future appointments.

## 2018-12-28 ENCOUNTER — Encounter: Payer: Self-pay | Admitting: Internal Medicine

## 2018-12-28 ENCOUNTER — Other Ambulatory Visit: Payer: Self-pay

## 2018-12-28 ENCOUNTER — Ambulatory Visit (INDEPENDENT_AMBULATORY_CARE_PROVIDER_SITE_OTHER): Payer: 59 | Admitting: Internal Medicine

## 2018-12-28 DIAGNOSIS — Z794 Long term (current) use of insulin: Secondary | ICD-10-CM | POA: Diagnosis not present

## 2018-12-28 DIAGNOSIS — N186 End stage renal disease: Secondary | ICD-10-CM | POA: Insufficient documentation

## 2018-12-28 DIAGNOSIS — E1165 Type 2 diabetes mellitus with hyperglycemia: Secondary | ICD-10-CM

## 2018-12-28 DIAGNOSIS — E1122 Type 2 diabetes mellitus with diabetic chronic kidney disease: Secondary | ICD-10-CM | POA: Insufficient documentation

## 2018-12-28 DIAGNOSIS — Z992 Dependence on renal dialysis: Secondary | ICD-10-CM

## 2018-12-28 NOTE — Progress Notes (Signed)
Name: [name]  Age/ Sex: 63 y.o., male   MRN/ DOB: [id number], 03/10/1956     PCP: Micheal Small, MD   Reason for Endocrinology Evaluation: Type 2 Diabetes Mellitus  Initial Endocrine Consultative Visit: 09/28/2018    PATIENT IDENTIFIER: Micheal [name] is a 63 y.o. male with a past medical history of DM, ESRD on HD (started on 03/2016), HTN, OSA. The patient has followed with Endocrinology clinic since 09/28/2018 for consultative assistance with management of his diabetes.  DIABETIC HISTORY:  Micheal [name] was diagnosed with T2DM 1992,has tried Metformin and  Glipizide in the past. Started insulin therapy years ago. His hemoglobin A1c has ranged from 6.7% in 2017, peaking at 12.3 %in 07/2018 .   SUBJECTIVE:   During the last visit (11/16/18): Increased Basaglar to 34 units daily and increased Humalog to 10 units TID QAC   Today (12/28/2018): Micheal [name] is here for a 6 weeks virtual check-in on his diabetes.  He checks his blood sugars sporadically through the freestyle libre. The patient has not had hypoglycemic episodes since the last clinic visit. Otherwise, the patient has not required any recent emergency interventions for hypoglycemia and has not had recent hospitalizations secondary to hyper or hypoglycemic episodes.    ROS: As per HPI and as detailed below: Review of Systems  Constitutional: Negative for fever.  HENT: Negative for congestion and sore throat.   Respiratory: Negative for cough and shortness of breath.   Cardiovascular: Negative for chest pain and palpitations.  Gastrointestinal: Negative for diarrhea and nausea.      HOME DIABETES REGIMEN:   Basaglar 34 units at bedtime  Humalog 10 units three times a day with meal  If Pre-meal BG > 200 mg/dL, to add 2 units of Humalog to the meal       CONTINUOUS GLUCOSE MONITORING RECORD INTERPRETATION    Dates of Recording: 3/25-03/29/2019  Sensor description: freestyle libre   Results  statistics:   CGM use % of time 43  Average and SD 227  Time in range     19  %  % Time Above 180 43  % Time above 250 35  % Time Below target 0    Glycemic patterns summary:  Hyperglycemia through the day    Hypoglycemic episodes occurred once after a bolus during the day   Overnight periods: no saved data for overnight     HISTORY:  Past Medical History:  Past Medical History:  Diagnosis Date  . Acute respiratory failure with hypoxia ([place])   . Anemia   . CAP (community acquired pneumonia)   . Chronic diastolic heart failure ([place])   . Chronic kidney disease    he is s/p AVF but not on HD yet  . Diabetes mellitus   . Hypertension   . Obesity (BMI 30-39.9)   . OSA (obstructive sleep apnea)    severe with AHI 72/hr   Past Surgical History:  Past Surgical History:  Procedure Laterality Date  . AV FISTULA PLACEMENT    . PERCUTANEOUS CORONARY STENT INTERVENTION (PCI-S)  04/2017    Social History:  reports that he has never smoked. He has never used smokeless tobacco. He reports that he does not drink alcohol or use drugs. Family History:  Family History  Problem Relation Age of Onset  . Heart failure Father   . Heart disease Father   . Heart failure Other      HOME MEDICATIONS: Allergies as of 12/28/2018   No  Known Allergies     Medication List       Accurate as of December 28, 2018  1:38 PM. Always use your most recent med list.        amLODipine 10 MG tablet Commonly known as:  NORVASC Take 10 mg by mouth daily.   atorvastatin 40 MG tablet Commonly known as:  LIPITOR Take 1 tablet (40 mg total) by mouth daily.   Basaglar KwikPen 100 UNIT/ML Sopn Inject 0.34 mLs (34 Units total) into the skin at bedtime.   calcitRIOL 0.5 MCG capsule Commonly known as:  ROCALTROL Take 1 capsule (0.5 mcg total) by mouth every Monday, Wednesday, and Friday with hemodialysis.   calcium acetate 667 MG capsule Commonly known as:  PHOSLO Take by mouth 3 (three) times  daily with meals.   fluticasone 50 MCG/ACT nasal spray Commonly known as:  FLONASE Place 2 sprays into both nostrils daily.   FreeStyle Libre 14 Day Sensor Misc 1 kit by Does not apply route 4 (four) times daily.   glucose blood test strip Commonly known as:  OneTouch Verio Four times daily   hydrALAZINE 25 MG tablet Commonly known as:  APRESOLINE Take 1 tablet (25 mg total) by mouth 3 (three) times daily.   insulin lispro 100 UNIT/ML KwikPen Commonly known as:  HumaLOG KwikPen Inject 0.1 mLs (10 Units total) into the skin 3 (three) times daily. Max daily dose 40 units   Insulin Pen Needle 31G X 5 MM Misc Commonly known as:  B-D UF III MINI PEN NEEDLES Four times a day   isosorbide dinitrate 30 MG tablet Commonly known as:  ISORDIL Take 1 tablet (30 mg total) by mouth 3 (three) times daily. Please call and schedule office visit for further refills 1st attempt   metoprolol 200 MG 24 hr tablet Commonly known as:  TOPROL-XL Take 1 tablet (200 mg total) by mouth daily. Please call and schedule office visit for further refills 1st attempt   omega-3 acid ethyl esters 1 g capsule Commonly known as:  LOVAZA Take 1 g by mouth daily.   omeprazole 20 MG capsule Commonly known as:  PRILOSEC Take 20 mg by mouth daily as needed.   sertraline 50 MG tablet Commonly known as:  ZOLOFT Take 0.5 tablets by mouth daily.         DATA REVIEWED: 08/18/18  A1c 12.3% BUN/Cr 32/7.29 mg/dL Na 137 mmol/L K 4.2 mmol/L TG 247 mg/dL  LDL 40 mg/dL   Results for Micheal [name] (MRN [id number]) as of 11/16/2018 13:17  Ref. Range 11/16/2018 13:06  Hemoglobin A1C Latest Ref Range: 4.0 - 5.6 % 9.6 (A)       ASSESSMENT / PLAN / RECOMMENDATIONS:   1) Type 2 Diabetes Mellitus, Poorly controlled, With Renal complications (ESRD on HD)- Most recent A1c of 9.6 %. Goal A1c < 7.0 %.     - In reviewing CGM download, the patient has checked twice in the past 2 week, pt states he has been using  the receiver and his phone interchangeably and believes this is why there's no enough data saves, I have explained to him again the importance of swiping the device every 8 hrs otherwise data will not be saved.  - I have also advised him in sticking with one type of receiver - Pt admits to taking his prandial insulin at times and taking after his meal as late as 45 minutes after a meal, I have discouraged him from taking his prandial insulin too  late after a meal due to risk of hypoglycemia.  - I have also advised him to avoid drinking sweet tea, and discussed diet options. - I will increase his prandial as above, I do not have enough data to adjust his basal at this time -Patient encouraged to continue with glucose checks and taking insulin as prescribed.   MEDICATIONS:  Basaglar to 34 units daily  Increase Humalog to 14 units 3 times daily with meals  If pre-meal BG is 200 over take 16 units of Humalog with meals  EDUCATION / INSTRUCTIONS:  BG monitoring instructions: Patient is instructed to check his blood sugars 4 times a day, before each meal and bedtime.  Call [facility] if: BG persistently < 70 or > 300. . I reviewed the Rule of 15 for the treatment of hypoglycemia in detail with the patient. Literature supplied.    I have spent 9 minutes of non-face to face encounter with the patient, > 50 % was spent in education and counseling as above.      F/U in 3 months    Signed electronically by: Mack Guise, MD  [facility]ocrin[facility]  [facility] [address] [place], [place] [address] Phone: [phone] FAX: [phone]   CC: Micheal Small, MD [place] [address] [address] Phone: [phone]  Fax: [phone]  Return to Endocrinology clinic as below: No future appointments.

## 2019-01-11 ENCOUNTER — Telehealth: Payer: Self-pay | Admitting: Cardiovascular Disease

## 2019-01-11 NOTE — Telephone Encounter (Signed)
LMOM to schedule a virtual visit with Dr. Oval Linsey

## 2019-04-18 ENCOUNTER — Other Ambulatory Visit: Payer: Self-pay | Admitting: Internal Medicine

## 2019-10-17 ENCOUNTER — Other Ambulatory Visit: Payer: Self-pay | Admitting: Internal Medicine

## 2019-10-27 ENCOUNTER — Ambulatory Visit (INDEPENDENT_AMBULATORY_CARE_PROVIDER_SITE_OTHER): Payer: 59 | Admitting: Internal Medicine

## 2019-10-27 ENCOUNTER — Encounter: Payer: Self-pay | Admitting: Internal Medicine

## 2019-10-27 ENCOUNTER — Other Ambulatory Visit: Payer: Self-pay

## 2019-10-27 VITALS — BP 132/68 | HR 73 | Temp 98.6°F | Ht 68.0 in | Wt 237.4 lb

## 2019-10-27 DIAGNOSIS — E1122 Type 2 diabetes mellitus with diabetic chronic kidney disease: Secondary | ICD-10-CM | POA: Diagnosis not present

## 2019-10-27 DIAGNOSIS — Z992 Dependence on renal dialysis: Secondary | ICD-10-CM

## 2019-10-27 DIAGNOSIS — E113591 Type 2 diabetes mellitus with proliferative diabetic retinopathy without macular edema, right eye: Secondary | ICD-10-CM | POA: Diagnosis not present

## 2019-10-27 DIAGNOSIS — Z794 Long term (current) use of insulin: Secondary | ICD-10-CM | POA: Diagnosis not present

## 2019-10-27 DIAGNOSIS — E1142 Type 2 diabetes mellitus with diabetic polyneuropathy: Secondary | ICD-10-CM | POA: Diagnosis not present

## 2019-10-27 DIAGNOSIS — N186 End stage renal disease: Secondary | ICD-10-CM | POA: Diagnosis not present

## 2019-10-27 HISTORY — DX: Type 2 diabetes mellitus with proliferative diabetic retinopathy without macular edema, right eye: E11.3591

## 2019-10-27 HISTORY — DX: Long term (current) use of insulin: Z79.4

## 2019-10-27 LAB — POCT GLYCOSYLATED HEMOGLOBIN (HGB A1C): Hemoglobin A1C: 9 % — AB (ref 4.0–5.6)

## 2019-10-27 MED ORDER — INSULIN LISPRO (1 UNIT DIAL) 100 UNIT/ML (KWIKPEN): 16.0000 [IU] | PEN_INJECTOR | Freq: Three times a day (TID) | SUBCUTANEOUS | 11 refills | Status: DC

## 2019-10-27 MED ORDER — BD PEN NEEDLE MINI U/F 31G X 5 MM MISC: 11 refills | Status: DC

## 2019-10-27 MED ORDER — LANTUS SOLOSTAR 100 UNIT/ML ~~LOC~~ SOPN: 36.0000 [IU] | PEN_INJECTOR | Freq: Every day | SUBCUTANEOUS | 11 refills | Status: DC

## 2019-10-27 NOTE — Patient Instructions (Signed)
-   Stop Basaglar - Start Lantus 36 units daily  - Increase Humalog to 16 units WITH each meal     HOW TO TREAT LOW BLOOD SUGARS (Blood sugar LESS THAN 70 MG/DL)  Please follow the RULE OF 15 for the treatment of hypoglycemia treatment (when your (blood sugars are less than 70 mg/dL)    STEP 1: Take 15 grams of carbohydrates when your blood sugar is low, which includes:   3-4 GLUCOSE TABS  OR  3-4 OZ OF JUICE OR REGULAR SODA OR  ONE TUBE OF GLUCOSE GEL     STEP 2: RECHECK blood sugar in 15 MINUTES STEP 3: If your blood sugar is still low at the 15 minute recheck --> then, go back to STEP 1 and treat AGAIN with another 15 grams of carbohydrates.

## 2019-10-27 NOTE — Progress Notes (Signed)
Name: Micheal [name]  Age/ Sex: 64 y.o., male   MRN/ DOB: [id number], 1956-06-29     PCP: Maurice Small, MD   Reason for Endocrinology Evaluation: Type 2 Diabetes Mellitus  Initial Endocrine Consultative Visit: 09/28/2018    PATIENT IDENTIFIER: Micheal [name] is a 64 y.o. male with a past medical history of DM, ESRD on HD (started on 03/2016), HTN, OSA. The patient has followed with Endocrinology clinic since 09/28/2018 for consultative assistance with management of his diabetes.  DIABETIC HISTORY:  Micheal [name] was diagnosed with T2DM 1992,has tried Metformin and  Glipizide in the past. Started insulin therapy years ago. His hemoglobin A1c has ranged from 6.7% in 2017, peaking at 12.3 %in 07/2018 .   SUBJECTIVE:   During the last visit (12/28/18): Continue Basaglar 34 units daily and increased Humalog to 10 units TID QAC   Today (10/27/2019): Micheal [name] is here for a follow up on diabetes .  He has not been here in 9 months  He checks his blood sugars sporadically through the freestyle libre. The patient has not had hypoglycemic episodes since the last clinic visit.  He continues to use Humalog postprandial rather than preprandial.  Otherwise, the patient has not required any recent emergency interventions for hypoglycemia and has not had recent hospitalizations secondary to hyper or hypoglycemic episodes.    ROS: As per HPI and as detailed below: Review of Systems  Constitutional: Negative for fever.  HENT: Negative for congestion and sore throat.   Respiratory: Negative for cough and shortness of breath.   Cardiovascular: Negative for chest pain and palpitations.  Gastrointestinal: Negative for diarrhea and nausea.      HOME DIABETES REGIMEN:   Basaglar 34 units at bedtime  Humalog 14 units three times a day with meal     CONTINUOUS GLUCOSE MONITORING RECORD INTERPRETATION    Dates of Recording: 1/21-10/26/2019  Sensor description: freestyle libre    Results statistics:   CGM use % of time 26  Average and SD 188  Time in range     58  %  % Time Above 180 21  % Time above 250 21  % Time Below target 0    Glycemic patterns summary:  Hyperglycemia postprandial   Hypoglycemic episodes occurred none Overnight periods: no saved data for overnight   DIABETIC COMPLICATIONS: Microvascular complications:   CKD , retinopathy on the right per pt S/P laser , neuropathy based on exam    Last eye exam: Completed 08/2019  Macrovascular complications:   CHF   Denies: PVD, CVA  HISTORY:  Past Medical History:  Past Medical History:  Diagnosis Date  . Acute respiratory failure with hypoxia ([place])   . Anemia   . CAP (community acquired pneumonia)   . Chronic diastolic heart failure ([place])   . Chronic kidney disease    he is s/p AVF but not on HD yet  . Diabetes mellitus   . Hypertension   . Obesity (BMI 30-39.9)   . OSA (obstructive sleep apnea)    severe with AHI 72/hr   Past Surgical History:  Past Surgical History:  Procedure Laterality Date  . AV FISTULA PLACEMENT    . PERCUTANEOUS CORONARY STENT INTERVENTION (PCI-S)  04/2017    Social History:  reports that he has never smoked. He has never used smokeless tobacco. He reports that he does not drink alcohol or use drugs. Family History:  Family History  Problem Relation Age of Onset  . Heart failure  Father   . Heart disease Father   . Heart failure Other      HOME MEDICATIONS: Allergies as of 10/27/2019   No Known Allergies     Medication List       Accurate as of October 27, 2019  8:24 AM. If you have any questions, ask your nurse or doctor.        amLODipine 10 MG tablet Commonly known as: NORVASC Take 10 mg by mouth daily.   atorvastatin 40 MG tablet Commonly known as: LIPITOR Take 1 tablet (40 mg total) by mouth daily.   B-D UF III MINI PEN NEEDLES 31G X 5 MM Misc Generic drug: Insulin Pen Needle USE FOUR TIMES DAILY AS DIRECTED   Basaglar  KwikPen 100 UNIT/ML Sopn Inject 0.34 mLs (34 Units total) into the skin at bedtime.   calcitRIOL 0.5 MCG capsule Commonly known as: ROCALTROL Take 1 capsule (0.5 mcg total) by mouth every Monday, Wednesday, and Friday with hemodialysis.   calcium acetate 667 MG capsule Commonly known as: PHOSLO Take by mouth 3 (three) times daily with meals.   fluticasone 50 MCG/ACT nasal spray Commonly known as: FLONASE Place 2 sprays into both nostrils daily.   FreeStyle Libre 14 Day Sensor Misc USE FOUR TIMES DAILY AS DIRECTED   glucose blood test strip Commonly known as: OneTouch Verio Four times daily   hydrALAZINE 25 MG tablet Commonly known as: APRESOLINE Take 1 tablet (25 mg total) by mouth 3 (three) times daily.   insulin lispro 100 UNIT/ML KwikPen Commonly known as: HumaLOG KwikPen Inject 0.1 mLs (10 Units total) into the skin 3 (three) times daily. Max daily dose 40 units   isosorbide dinitrate 30 MG tablet Commonly known as: ISORDIL Take 1 tablet (30 mg total) by mouth 3 (three) times daily. Please call and schedule office visit for further refills 1st attempt   metoprolol 200 MG 24 hr tablet Commonly known as: TOPROL-XL Take 1 tablet (200 mg total) by mouth daily. Please call and schedule office visit for further refills 1st attempt   omega-3 acid ethyl esters 1 g capsule Commonly known as: LOVAZA Take 1 g by mouth daily.   omeprazole 20 MG capsule Commonly known as: PRILOSEC Take 20 mg by mouth daily as needed.   sertraline 50 MG tablet Commonly known as: ZOLOFT Take 0.5 tablets by mouth daily.       OBJECTIVE:   Vital Signs: BP 132/68 (BP Location: Right Arm, Patient Position: Sitting, Cuff Size: Large)   Pulse 73   Temp 98.6 F (37 C)   Ht 5\' 8"  (1.727 m)   Wt 237 lb 6.4 oz (107.7 kg)   SpO2 98%   BMI 36.10 kg/m    General: Pt appears well and is in NAD  Lungs: Clear with good BS bilat with no rales, rhonchi, or wheezes  Heart: RRR with normal S1  and S2 and no gallops; no murmurs; no rub  Abdomen: Normoactive bowel sounds, soft, nontender, without masses or organomegaly palpable  Extremities: No pretibial edema.  Neuro: MS is good with appropriate affect, pt is alert and Ox3      DM foot exam:10/27/2019 The skin of the feet is intact without sores or ulcerations. The pedal pulses are 2+ on right and 2+ on left. The sensation is decreased to a screening 5.07, 10 gram monofilament on the left       DATA REVIEWED:    10/26/2019 9.11 (H) 0.50 - 1.30 mg/dL INFIAN TIME  Potassium, Bld 4.9 (H) 3.6 - 5.1 mmol/L   Chloride 101 (H) 97 - 110 mmol/L   CO2 Content 22 (H) 22 - 32 mmol/L   Calcium 9.5 (H) 8.9 - 10.3 mg/dL   Phosphorus 6.0 (H) 2.5 - 5.5 mg/dL   CA X PHOS 56.9 (H) 15.0 - 75.0   Alkaline Phosphatase 57 (H) 38 - 195 IU/L   SGOT (AST) 14 (H) 12 - 50 IU/L   LDH 185 (H) 140 - 271 IU/L   PROTEIN  7.5 (H) 6.2 - 8.3 g/dL   ALBUMIN 3.8 (H) 3.5 - 5.2 g/dL   Albumin/Globulin Ratio 1.0 (L) 1.1 - 1.9 Ratio   Sodium 139 (L) 136 - 144 mmol/L   ALT 16 (L) 17 - 63 IU/L      ASSESSMENT / PLAN / RECOMMENDATIONS:   1) Type 2 Diabetes Mellitus, Poorly controlled, With Renal complications (ESRD on HD), neuropathic and retinal complication - Most recent A1c of 9.0 %. Goal A1c < 7.0 %.     -Patient continues with hypoglycemia due to medication noncompliance and dietary indiscretions.  His A1c on September 28, 2019 was 11.1% and today October 27, 2019 his A1c is 9.0%, this is an indication that the patient was not taking his insulin for the.  Prior to January 2021 -In review of his CGM download is only used it 26% of the time, there is not enough data to develop pattern, he continues to take his prandial insulin postprandial rather than preprandial, based on the limited data on his CGM download today we will adjust his insulin regimen as below -Patient would like to switch Basaglar to another basal insulin as his insurance does  not cover Hernandez anymore, we will try and send in a prescription for Lantus but I explained to the patient he may have to reach out directly to his insurance company and find out the preferred medicine on the formulary. We discussed the importance of glycemic control in helping his retinopathy heal, and in protecting his feet and reducing the risk of amputations.   MEDICATIONS:  Stop Basaglar   Start Lantus 36 units daily  Increase Humalog to 16 units 3 times daily with meals   EDUCATION / INSTRUCTIONS:  BG monitoring instructions: Patient is instructed to check his blood sugars 4 times a day, before each meal and bedtime.  Call [facility] if: BG persistently < 70 or > 300. . I reviewed the Rule of 15 for the treatment of hypoglycemia in detail with the patient. Literature supplied.    2) Diabetic complications:   Eye: Does have known diabetic retinopathy.   Neuro/ Feet: Does have known diabetic peripheral neuropathy based on foot exam on 10/27/2019  Renal: Patient does  have known ESRD, patient is on HD   F/U in 4 months    Signed electronically by: Mack Guise, MD  [facility]  [address] [place], [place] [address] Phone: [phone] FAX: [phone]   CC: Maurice Small, MD [address] [place] [address] Phone: [phone]  Fax: [phone]  Return to Endocrinology clinic as below: Future Appointments  Date Time Provider [place]  10/27/2019 10:50 AM Christ Fullenwider, Melanie Crazier, MD [id number] None

## 2019-11-07 ENCOUNTER — Other Ambulatory Visit: Payer: Self-pay

## 2019-11-07 MED ORDER — FREESTYLE LIBRE 14 DAY SENSOR MISC: 6 refills | Status: DC

## 2019-12-29 ENCOUNTER — Other Ambulatory Visit: Payer: Self-pay

## 2019-12-29 ENCOUNTER — Ambulatory Visit (INDEPENDENT_AMBULATORY_CARE_PROVIDER_SITE_OTHER): Payer: Medicare Other | Admitting: Ophthalmology

## 2019-12-29 ENCOUNTER — Encounter (INDEPENDENT_AMBULATORY_CARE_PROVIDER_SITE_OTHER): Payer: Self-pay | Admitting: Ophthalmology

## 2019-12-29 DIAGNOSIS — H4311 Vitreous hemorrhage, right eye: Secondary | ICD-10-CM

## 2019-12-29 DIAGNOSIS — Z794 Long term (current) use of insulin: Secondary | ICD-10-CM

## 2019-12-29 DIAGNOSIS — E113591 Type 2 diabetes mellitus with proliferative diabetic retinopathy without macular edema, right eye: Secondary | ICD-10-CM

## 2019-12-29 DIAGNOSIS — H25012 Cortical age-related cataract, left eye: Secondary | ICD-10-CM | POA: Insufficient documentation

## 2019-12-29 DIAGNOSIS — H2511 Age-related nuclear cataract, right eye: Secondary | ICD-10-CM | POA: Diagnosis not present

## 2019-12-29 DIAGNOSIS — H269 Unspecified cataract: Secondary | ICD-10-CM

## 2019-12-29 DIAGNOSIS — E113592 Type 2 diabetes mellitus with proliferative diabetic retinopathy without macular edema, left eye: Secondary | ICD-10-CM

## 2019-12-29 DIAGNOSIS — E113551 Type 2 diabetes mellitus with stable proliferative diabetic retinopathy, right eye: Secondary | ICD-10-CM | POA: Insufficient documentation

## 2019-12-29 HISTORY — DX: Vitreous hemorrhage, right eye: H43.11

## 2019-12-29 HISTORY — DX: Age-related nuclear cataract, right eye: H25.11

## 2019-12-29 NOTE — Progress Notes (Signed)
12/29/2019     CHIEF COMPLAINT Patient presents for Diabetic Eye Exam   HISTORY OF PRESENT ILLNESS: Micheal [name] is a 64 y.o. male who presents to the clinic today for:   HPI    Diabetic Eye Exam    Vision is stable.  Associated Symptoms Floaters.  Diabetes characteristics include Type 2 and on insulin.  This started 7 weeks ago.  Blood sugar level is controlled.  Last Blood Glucose 139 (Just now).  Last A1C 9 (10/2019).          Comments    7 Week Diabetic Exam OU  OD is now currently now 3 months status post panretinal photocoagulation right eye and left eye  Pt c/o new floaters OD x 1 week that are constant. Pt denies any other changes to [place] OU. No new floaters OS.       Last edited by Hurman Horn, MD on 12/29/2019  2:01 PM. (History)      Referring physician: Maurice Small, MD [address],  [place] [id number]  HISTORICAL INFORMATION:   Selected notes from the MEDICAL RECORD NUMBER    Lab Results  Component Value Date   HGBA1C 9.0 (A) 10/27/2019     CURRENT MEDICATIONS: No current outpatient medications on file. (Ophthalmic Drugs)   No current facility-administered medications for this visit. (Ophthalmic Drugs)   Current Outpatient Medications (Other)  Medication Sig  . aspirin 81 MG EC tablet Take by mouth.  . nitroGLYCERIN (NITROSTAT) 0.4 MG SL tablet Place under the tongue.  [name] Kitchen amLODipine (NORVASC) 10 MG tablet Take 10 mg by mouth daily.  [name] Kitchen atorvastatin (LIPITOR) 40 MG tablet Take 1 tablet (40 mg total) by mouth daily.  . calcitRIOL (ROCALTROL) 0.5 MCG capsule Take 1 capsule (0.5 mcg total) by mouth every Monday, Wednesday, and Friday with hemodialysis.  [name] Kitchen calcium acetate (PHOSLO) 667 MG capsule Take by mouth 3 (three) times daily with meals.  . Continuous Blood Gluc Sensor (FREESTYLE LIBRE 14 DAY SENSOR) MISC USE FOUR TIMES DAILY AS DIRECTED  . fluticasone (FLONASE) 50 MCG/ACT nasal spray Place 2 sprays into both nostrils daily.   [name] Kitchen glucose blood (ONETOUCH VERIO) test strip Four times daily  . hydrALAZINE (APRESOLINE) 25 MG tablet Take 1 tablet (25 mg total) by mouth 3 (three) times daily.  . Insulin Glargine (LANTUS SOLOSTAR) 100 UNIT/ML Solostar Pen Inject 36 Units into the skin daily.  . insulin lispro (HUMALOG KWIKPEN) 100 UNIT/ML KwikPen Inject 0.16 mLs (16 Units total) into the skin 3 (three) times daily. Max daily dose 40 units  . Insulin Pen Needle (B-D UF III MINI PEN NEEDLES) 31G X 5 MM MISC USE FOUR TIMES DAILY AS DIRECTED  . isosorbide dinitrate (ISORDIL) 30 MG tablet Take 1 tablet (30 mg total) by mouth 3 (three) times daily. Please call and schedule office visit for further refills 1st attempt  . metoprolol (TOPROL-XL) 200 MG 24 hr tablet Take 1 tablet (200 mg total) by mouth daily. Please call and schedule office visit for further refills 1st attempt  . omega-3 acid ethyl esters (LOVAZA) 1 G capsule Take 1 g by mouth daily.    [name] Kitchen omeprazole (PRILOSEC) 20 MG capsule Take 20 mg by mouth daily as needed.   . sertraline (ZOLOFT) 50 MG tablet Take 0.5 tablets by mouth daily.    No current facility-administered medications for this visit. (Other)      REVIEW OF SYSTEMS:    ALLERGIES No Known Allergies  PAST MEDICAL HISTORY Past Medical History:  Diagnosis Date  . Acute respiratory failure with hypoxia ([place])   . Anemia   . CAP (community acquired pneumonia)   . Chronic diastolic heart failure ([place])   . Chronic kidney disease    he is s/p AVF but not on HD yet  . Diabetes mellitus   . Hypertension   . Obesity (BMI 30-39.9)   . OSA (obstructive sleep apnea)    severe with AHI 72/hr   Past Surgical History:  Procedure Laterality Date  . AV FISTULA PLACEMENT    . PERCUTANEOUS CORONARY STENT INTERVENTION (PCI-S)  04/2017    FAMILY HISTORY Family History  Problem Relation Age of Onset  . Heart failure Father   . Heart disease Father   . Heart failure Other     SOCIAL HISTORY Social  History   Tobacco Use  . Smoking status: Never Smoker  . Smokeless tobacco: Never Used  Substance Use Topics  . Alcohol use: No    Alcohol/week: 0.0 standard drinks  . Drug use: No         OPHTHALMIC EXAM:  Base Eye Exam    Visual Acuity (Snellen - Linear)      Right Left   Dist [place] 20/40 20/25 -2   Dist ph [place] 20/30 -1 20/20       Tonometry (Tonopen, 1:21 PM)      Right Left   Pressure 15 13       Pupils      Pupils Dark Light Shape React APD   Right PERRL 4 3 Round Brisk None   Left PERRL 4 3 Round Brisk None       Visual Fields (Counting fingers)      Left Right    Full Full       Extraocular Movement      Right Left    Full Full       Neuro/Psych    Oriented x3: Yes   Mood/Affect: Normal       Dilation    Both eyes: 1.0% Mydriacyl, 2.5% Phenylephrine @ 1:21 PM        Slit Lamp and Fundus Exam    External Exam      Right Left   External Normal Normal       Slit Lamp Exam      Right Left   Lids/Lashes Normal Normal   Conjunctiva/Sclera White and quiet White and quiet   Cornea Clear Clear   Anterior Chamber Deep and quiet Deep and quiet   Iris Round and reactive Round and reactive   Lens 2+ Nuclear sclerosis, Cortical spokes, 2+ Cortical cataract 2+ Nuclear sclerosis, Cortical spokes, 2+ Cortical cataract   Vitreous  Vitreous hemorrhage Normal       Fundus Exam      Right Left   Disc Neovascularization Normal   C/D Ratio 0.3 0.3   Macula Normal Normal   Vessels Retinopathy,, proliferative diabetic retinopathy active Retinopathy,, proliferative diabetic retinopathy active   Periphery Nasal PRP Nasal PRP          IMAGING AND PROCEDURES  Imaging and Procedures for @TODAY @  OCT, Retina - OU - Both Eyes       Right Eye Quality was good. Scan locations included subfoveal. Findings include normal observations.   Left Eye Quality was good. Scan locations included subfoveal. Findings include normal observations, vitreomacular  adhesion .   Notes OD, vitreous detachment with vitreous hemorrhage  Panretinal Photocoagulation - OD - Right Eye       Time Out Confirmed correct patient, procedure, site, and patient consented.   Anesthesia Topical anesthesia was used. Anesthetic medications included Proparacaine 0.5%.   Laser Information The type of laser was diode. Color was yellow. The duration in seconds was 0.03. The spot size was 390 microns. Laser power was 220. Total spots was 774.   Post-op The patient tolerated the procedure well. There were no complications. The patient received written and verbal post procedure care education.                 ASSESSMENT/PLAN:  @PROBAPNOTE @    ICD-10-CM   1. 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 Vitreous hemorrhage of right eye ([place])  H43.11   3. 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 Nuclear sclerotic cataract of right eye  H25.11   5. Cortical cataract of both eyes  H26.9     1.  2.  3.  Ophthalmic Meds Ordered this visit:  No orders of the defined types were placed in this encounter.      Return in about 2 weeks (around 01/12/2020) for OS, dilate, PRP.  Patient Instructions  Diabetic Retinopathy Diabetic retinopathy is a disease of the retina. The retina is a light-sensitive membrane at the back of the eye. Retinopathy is a complication of diabetes (diabetes mellitus) and a common cause of bad eyesight (visual impairment). It can eventually cause blindness. Early detection and treatment of diabetic retinopathy is important in keeping your eyes healthy and preventing further damage to them. What are the causes?  Diabetic retinopathy is caused by blood sugar (glucose) levels that are too high for an extended period of time. High blood glucose over an extended period of time can:  Damage small blood vessels in the retina, allowing blood to leak through the vessel walls.  Cause new, abnormal blood vessels to grow on the retina. This can scar the retina in the advanced stage of diabetic retinopathy. What increases the risk? You are more likely to develop this condition if:  You have had diabetes for a long time.  You have poorly controlled blood glucose.  You have high blood pressure. What are the signs or symptoms? In the early stages of diabetic retinopathy, there are often no symptoms. As the condition gets worse, symptoms may include:  Blurred vision. This is usually caused by swelling due to abnormal blood glucose levels. The blurriness may go away when blood glucose levels return to normal.  Moving specks or dark spots (floaters) in your vision. These can be caused by a small amount of bleeding (hemorrhage) from retinal blood vessels.  Missing parts of your field of vision, such as vision at the sides of the eyes. This can be caused by larger retinal hemorrhages.  Difficulty reading.  Double vision.  Pain in one or both eyes.  Feeling pressure in one or both eyes.  Trouble seeing straight lines. Straight lines may not look straight.  Redness of the eyes that does not go away. How is this diagnosed? This condition may be diagnosed with an eye exam in which your eye care  specialist puts drops in your eyes that enlarge (dilate) your pupils. This lets your health care provider examine your retina and check for changes in your retinal blood vessels. How is this treated? This condition may be treated by:  Keeping your blood glucose and blood pressure within a target range.  Using a type of laser beam to seal your retinal blood vessels. This stops them from bleeding and decreases pressure  in your eye.  Getting shots of medicine in the eye to reduce swelling of the center of the retina (macula). You may be given: ? Anti-VEGF medicine. This medicine can help slow vision loss, and may even improve vision. ? Steroid medicine. Follow these instructions at home:   Follow your diabetes management plan as directed by your health care provider. This may include exercising regularly and eating a healthy diet.  Keep your blood glucose level and your blood pressure in your target range, as directed by your health care provider.  Check your blood glucose as often as directed.  Take over the counter and prescription medicines only as told by your health care provider. This includes insulin and oral diabetes medicine.  Get your eyes checked at least once every year. An eye specialist can usually see diabetic retinopathy developing long before it starts to cause problems. In many cases, it can be treated to prevent complications from occurring.  Do not use any products that contain nicotine or tobacco, such as cigarettes and e-cigarettes. If you need help quitting, ask your health care provider.  Keep all follow-up visits as told by your health care provider. This is important. Contact a health care provider if:  You notice gradual blurring or other changes in your vision over time.  You notice that your glasses or contact lenses do not make things look as sharp as they once did.  You have trouble reading or seeing details at a distance with either eye.  You notice a change in your vision or notice that parts of your field of vision appear missing or hazy.  You suddenly see moving specks or dark spots in the field of vision of either eye. Get help right away if:  You have sudden pain or pressure in one or both eyes.  You suddenly lose vision or a curtain or veil seems to come across your eyes.  You have a sudden burst of floaters in your vision. Summary  Diabetic retinopathy  is a disease of the retina. The retina is a light-sensitive membrane at the back of the eye. Retinopathy is a complication of diabetes.  Get your eyes checked at least once every year. An eye specialist can usually see diabetic retinopathy developing long before it starts to cause problems. In many cases, it can be treated to prevent complications from occurring.  Keep your blood glucose and your blood pressure in target range. Follow your diabetes management plan as directed by your health care provider.  Protect your eyes. Wear sunglasses and eye protection when needed. This information is not intended to replace advice given to you by your health care provider. Make sure you discuss any questions you have with your health care provider. Document Revised: 10/21/2017 Document Reviewed: 10/13/2016 Elsevier Patient Education  [employer].  The nature of proliferative diabetic retinopathy was discussed with the patient as well as the potential for a vitreous hemorrhage and traction on the retina. Tight control of glucose, blood pressure, and serum lipids was recommended in order to slow further progression of  disease. Cessation of smoking was recommended. Treatment options including ocular injectable medications, panretinal photocoagulation and/or vitrectomy surgery for advanced disease were discussed. Local medical therapy may slow or arrest progression of disease.  Cataract  A cataract is a buildup of protein that causes the lens of your eye to become cloudy. The lens is normally clear. It is the part of the eye that is behind your iris and pupil. The lens focuses light on the retina, which lets you see clearly. When a lens becomes cloudy, your vision may become blurry. The clouding can range from a tiny dot to complete cloudiness. As some cataracts develop, they can make it harder for you to see things that are far away. (You become more nearsighted.) Other cataracts increase glare. Cataracts  can worsen over time, and sometimes the pupil can look white. As cataracts get worse, they cloud more of the lens, making it difficult to see. Cataracts can affect one eye or both eyes. What are the causes? This condition may be caused by age-related eye changes. The lens of the eye is mostly made up of water and protein. Normally, this protein is arranged in a way that keeps the lens clear. Cataracts develop when protein begins to clump together over time. This buildup of protein clouds the lens and lets less light pass through to the retina, which causes blurry vision. What increases the risk? You are more likely to develop this condition if you:  Are 71 years of age or older.  Have diabetes.  Have high blood pressure.  Take certain medicines, such as steroids or hormone replacement therapy.  Have had an eye injury.  Have or have had eye inflammation.  Have a family history of cataracts.  Smoke.  Drink alcohol heavily.  Are frequently exposed to sun or very strong light without eye protection.  Are obese.  Have been exposed to large amounts of radiation, lead, or other toxic substances.  Have had eye surgery. What are the signs or symptoms? The main symptom of a cataract is blurry vision. Your vision may change or get worse over time. Other symptoms include:  Increased glare.  Seeing a bright ring or halo around light.  Poor night vision.  Double or "shadow" vision in one eye or both eyes.  Having trouble seeing, even while wearing contact lenses or glasses.  Seeing colors that appear faded.  Having trouble telling the difference between blue and purple.  Needing frequent changes to your prescription glasses or contacts. How is this diagnosed? This condition is diagnosed with a medical history and eye exam.  You should see an eye specialist (optometrist or ophthalmologist).  Your health care provider may enlarge (dilate) your pupils with eye drops to see the  back of your eye more clearly and look for signs of cataracts or other eye damage. You may also have tests, including:  A visual acuity test. This uses a chart to determine the smallest letters that you can see from a specific distance.  A slit-lamp exam. This uses a microscope to examine small sections of your eye for abnormalities.  Tonometry. This test measures the pressure of the fluid inside your eye.  Glare testing. This test shines a light in your eye while you view letters to see whether the bright light affects your vision. How is this treated? Treatment depends on the stage of your cataract. You may:  Wear eyeglasses or use stronger light. This is for an early-stage cataract.  Have surgery if the  condition is severely affecting your vision. This is needed for late-stage cataract.  Stop or change certain medicines. This is recommended if your health care provider thinks your cataract may be linked to your medicines. Follow these instructions at home: Lifestyle  Use stronger or brighter lighting.  Consider using a magnifying glass for reading or other activities.  Become familiar with your surroundings. Having poor vision can put you at greater risk for tripping, falling, or bumping into things.  Wear sunglasses and a hat if you are sensitive to bright light or are having problems with glare.  Do not use any products that contain nicotine or tobacco, such as cigarettes, e-cigarettes, and chewing tobacco. If you need help quitting, ask your health care provider. General instructions  If you are prescribed new eyeglasses, wear them as told by your health care provider.  Take over-the-counter and prescription medicines only as told by your health care provider. Do not change your medicines unless told by your health care provider.  Do not drive or use heavy machinery if your vision is blurry, particularly at night.  Keep your blood sugar under control if you have diabetes.   Keep all follow-up visits as told by your health care provider. This is important. Contact a health care provider if:  Your symptoms get worse.  Your vision affects your ability to perform daily activities.  You have new symptoms.  You have a fever. Get help right away if:  You have sudden vision loss.  You have redness, swelling, or increasing pain in your eye.  You develop a headache and sensitivity to light. Summary  A cataract is a buildup of protein that causes the lens of your eye to become cloudy. Cataracts are very common, especially as people age.  Mild cataracts cause mild visual symptoms, while more severe cataracts can cause a significant decrease in quality of life.  Mild cataracts can often be treated with a prescription for new glasses or contact lenses, while surgery is often recommended for more severe cataracts.  Contact a health care provider if your symptoms get worse, your vision affects your ability to do daily activities, or you have a fever.  Get help right away if you have sudden vision loss, redness, swelling, or increasing pain in the eye, or you develop a headache or sensitivity to light. This information is not intended to replace advice given to you by your health care provider. Make sure you discuss any questions you have with your health care provider. Document Revised: 03/08/2018 Document Reviewed: 03/08/2018 Elsevier Patient Education  [employer].    Patient is seek prompt reevaluation with Dr. Teola Bradley to arrange for cataract extraction with intraocular lens placement right eye initially.  This will allow for clear visualization should vitrectomy and endolaser be undertaken for the right eye for due to vitreous hemorrhage    Explained the diagnoses, plan, and follow up with the patient and they expressed understanding.  Patient expressed understanding of the importance of proper follow up care.   Clent Demark Ruvim Risko M.D. Diseases &  Surgery of the Retina and Vitreous Retina & Diabetic [place] @TODAY @     Abbreviations: M myopia (nearsighted); A astigmatism; H hyperopia (farsighted); P presbyopia; Mrx spectacle prescription;  CTL contact lenses; OD right eye; OS left eye; OU both eyes  XT exotropia; ET esotropia; PEK punctate epithelial keratitis; PEE punctate epithelial erosions; DES dry eye syndrome; MGD meibomian gland dysfunction; ATs artificial tears; PFAT's preservative free artificial tears; [place]  nuclear sclerotic cataract; PSC posterior subcapsular cataract; ERM epi-retinal membrane; PVD posterior vitreous detachment; RD retinal detachment; DM diabetes mellitus; DR diabetic retinopathy; NPDR non-proliferative diabetic retinopathy; PDR proliferative diabetic retinopathy; CSME clinically significant macular edema; DME diabetic macular edema; dbh dot blot hemorrhages; CWS cotton wool spot; POAG primary open angle glaucoma; C/D cup-to-disc ratio; HVF humphrey visual field; GVF goldmann visual field; OCT optical coherence tomography; IOP intraocular pressure; BRVO Branch retinal vein occlusion; CRVO central retinal vein occlusion; CRAO central retinal artery occlusion; BRAO branch retinal artery occlusion; RT retinal tear; SB scleral buckle; PPV pars plana vitrectomy; VH Vitreous hemorrhage; PRP panretinal laser photocoagulation; IVK intravitreal kenalog; VMT vitreomacular traction; MH Macular hole;  NVD neovascularization of the disc; NVE neovascularization elsewhere; AREDS age related eye disease study; ARMD age related macular degeneration; POAG primary open angle glaucoma; EBMD epithelial/anterior basement membrane dystrophy; ACIOL anterior chamber intraocular lens; IOL intraocular lens; PCIOL posterior chamber intraocular lens; Phaco/IOL phacoemulsification with intraocular lens placement; [place] photorefractive keratectomy; LASIK laser assisted in situ keratomileusis; HTN hypertension; DM diabetes mellitus; COPD chronic  obstructive pulmonary disease

## 2019-12-29 NOTE — Patient Instructions (Addendum)
Diabetic Retinopathy Diabetic retinopathy is a disease of the retina. The retina is a light-sensitive membrane at the back of the eye. Retinopathy is a complication of diabetes (diabetes mellitus) and a common cause of bad eyesight (visual impairment). It can eventually cause blindness. Early detection and treatment of diabetic retinopathy is important in keeping your eyes healthy and preventing further damage to them. What are the causes? Diabetic retinopathy is caused by blood sugar (glucose) levels that are too high for an extended period of time. High blood glucose over an extended period of time can:  Damage small blood vessels in the retina, allowing blood to leak through the vessel walls.  Cause new, abnormal blood vessels to grow on the retina. This can scar the retina in the advanced stage of diabetic retinopathy. What increases the risk? You are more likely to develop this condition if:  You have had diabetes for a long time.  You have poorly controlled blood glucose.  You have high blood pressure. What are the signs or symptoms? In the early stages of diabetic retinopathy, there are often no symptoms. As the condition gets worse, symptoms may include:  Blurred vision. This is usually caused by swelling due to abnormal blood glucose levels. The blurriness may go away when blood glucose levels return to normal.  Moving specks or dark spots (floaters) in your vision. These can be caused by a small amount of bleeding (hemorrhage) from retinal blood vessels.  Missing parts of your field of vision, such as vision at the sides of the eyes. This can be caused by larger retinal hemorrhages.  Difficulty reading.  Double vision.  Pain in one or both eyes.  Feeling pressure in one or both eyes.  Trouble seeing straight lines. Straight lines may not look straight.  Redness of the eyes that does not go away. How is this diagnosed? This condition may be diagnosed with an eye exam in  which your eye care specialist puts drops in your eyes that enlarge (dilate) your pupils. This lets your health care provider examine your retina and check for changes in your retinal blood vessels. How is this treated? This condition may be treated by:  Keeping your blood glucose and blood pressure within a target range.  Using a type of laser beam to seal your retinal blood vessels. This stops them from bleeding and decreases pressure in your eye.  Getting shots of medicine in the eye to reduce swelling of the center of the retina (macula). You may be given: ? Anti-VEGF medicine. This medicine can help slow vision loss, and may even improve vision. ? Steroid medicine. Follow these instructions at home:   Follow your diabetes management plan as directed by your health care provider. This may include exercising regularly and eating a healthy diet.  Keep your blood glucose level and your blood pressure in your target range, as directed by your health care provider.  Check your blood glucose as often as directed.  Take over the counter and prescription medicines only as told by your health care provider. This includes insulin and oral diabetes medicine.  Get your eyes checked at least once every year. An eye specialist can usually see diabetic retinopathy developing long before it starts to cause problems. In many cases, it can be treated to prevent complications from occurring.  Do not use any products that contain nicotine or tobacco, such as cigarettes and e-cigarettes. If you need help quitting, ask your health care provider.  Keep all follow-up   visits as told by your health care provider. This is important. Contact a health care provider if:  You notice gradual blurring or other changes in your vision over time.  You notice that your glasses or contact lenses do not make things look as sharp as they once did.  You have trouble reading or seeing details at a distance with either  eye.  You notice a change in your vision or notice that parts of your field of vision appear missing or hazy.  You suddenly see moving specks or dark spots in the field of vision of either eye. Get help right away if:  You have sudden pain or pressure in one or both eyes.  You suddenly lose vision or a curtain or veil seems to come across your eyes.  You have a sudden burst of floaters in your vision. Summary  Diabetic retinopathy is a disease of the retina. The retina is a light-sensitive membrane at the back of the eye. Retinopathy is a complication of diabetes.  Get your eyes checked at least once every year. An eye specialist can usually see diabetic retinopathy developing long before it starts to cause problems. In many cases, it can be treated to prevent complications from occurring.  Keep your blood glucose and your blood pressure in target range. Follow your diabetes management plan as directed by your health care provider.  Protect your eyes. Wear sunglasses and eye protection when needed. This information is not intended to replace advice given to you by your health care provider. Make sure you discuss any questions you have with your health care provider. Document Revised: 10/21/2017 Document Reviewed: 10/13/2016 Elsevier Patient Education  [employer].  The nature of proliferative diabetic retinopathy was discussed with the patient as well as the potential for a vitreous hemorrhage and traction on the retina. Tight control of glucose, blood pressure, and serum lipids was recommended in order to slow further progression of disease. Cessation of smoking was recommended. Treatment options including ocular injectable medications, panretinal photocoagulation and/or vitrectomy surgery for advanced disease were discussed. Local medical therapy may slow or arrest progression of disease.  Cataract  A cataract is a buildup of protein that causes the lens of your eye to become  cloudy. The lens is normally clear. It is the part of the eye that is behind your iris and pupil. The lens focuses light on the retina, which lets you see clearly. When a lens becomes cloudy, your vision may become blurry. The clouding can range from a tiny dot to complete cloudiness. As some cataracts develop, they can make it harder for you to see things that are far away. (You become more nearsighted.) Other cataracts increase glare. Cataracts can worsen over time, and sometimes the pupil can look white. As cataracts get worse, they cloud more of the lens, making it difficult to see. Cataracts can affect one eye or both eyes. What are the causes? This condition may be caused by age-related eye changes. The lens of the eye is mostly made up of water and protein. Normally, this protein is arranged in a way that keeps the lens clear. Cataracts develop when protein begins to clump together over time. This buildup of protein clouds the lens and lets less light pass through to the retina, which causes blurry vision. What increases the risk? You are more likely to develop this condition if you:  Are 15 years of age or older.  Have diabetes.  Have high blood pressure.  Take certain medicines, such as steroids or hormone replacement therapy.  Have had an eye injury.  Have or have had eye inflammation.  Have a family history of cataracts.  Smoke.  Drink alcohol heavily.  Are frequently exposed to sun or very strong light without eye protection.  Are obese.  Have been exposed to large amounts of radiation, lead, or other toxic substances.  Have had eye surgery. What are the signs or symptoms? The main symptom of a cataract is blurry vision. Your vision may change or get worse over time. Other symptoms include:  Increased glare.  Seeing a bright ring or halo around light.  Poor night vision.  Double or "shadow" vision in one eye or both eyes.  Having trouble seeing, even while  wearing contact lenses or glasses.  Seeing colors that appear faded.  Having trouble telling the difference between blue and purple.  Needing frequent changes to your prescription glasses or contacts. How is this diagnosed? This condition is diagnosed with a medical history and eye exam.  You should see an eye specialist (optometrist or ophthalmologist).  Your health care provider may enlarge (dilate) your pupils with eye drops to see the back of your eye more clearly and look for signs of cataracts or other eye damage. You may also have tests, including:  A visual acuity test. This uses a chart to determine the smallest letters that you can see from a specific distance.  A slit-lamp exam. This uses a microscope to examine small sections of your eye for abnormalities.  Tonometry. This test measures the pressure of the fluid inside your eye.  Glare testing. This test shines a light in your eye while you view letters to see whether the bright light affects your vision. How is this treated? Treatment depends on the stage of your cataract. You may:  Wear eyeglasses or use stronger light. This is for an early-stage cataract.  Have surgery if the condition is severely affecting your vision. This is needed for late-stage cataract.  Stop or change certain medicines. This is recommended if your health care provider thinks your cataract may be linked to your medicines. Follow these instructions at home: Lifestyle  Use stronger or brighter lighting.  Consider using a magnifying glass for reading or other activities.  Become familiar with your surroundings. Having poor vision can put you at greater risk for tripping, falling, or bumping into things.  Wear sunglasses and a hat if you are sensitive to bright light or are having problems with glare.  Do not use any products that contain nicotine or tobacco, such as cigarettes, e-cigarettes, and chewing tobacco. If you need help quitting,  ask your health care provider. General instructions  If you are prescribed new eyeglasses, wear them as told by your health care provider.  Take over-the-counter and prescription medicines only as told by your health care provider. Do not change your medicines unless told by your health care provider.  Do not drive or use heavy machinery if your vision is blurry, particularly at night.  Keep your blood sugar under control if you have diabetes.  Keep all follow-up visits as told by your health care provider. This is important. Contact a health care provider if:  Your symptoms get worse.  Your vision affects your ability to perform daily activities.  You have new symptoms.  You have a fever. Get help right away if:  You have sudden vision loss.  You have redness, swelling, or increasing pain in your  eye.  You develop a headache and sensitivity to light. Summary  A cataract is a buildup of protein that causes the lens of your eye to become cloudy. Cataracts are very common, especially as people age.  Mild cataracts cause mild visual symptoms, while more severe cataracts can cause a significant decrease in quality of life.  Mild cataracts can often be treated with a prescription for new glasses or contact lenses, while surgery is often recommended for more severe cataracts.  Contact a health care provider if your symptoms get worse, your vision affects your ability to do daily activities, or you have a fever.  Get help right away if you have sudden vision loss, redness, swelling, or increasing pain in the eye, or you develop a headache or sensitivity to light. This information is not intended to replace advice given to you by your health care provider. Make sure you discuss any questions you have with your health care provider. Document Revised: 03/08/2018 Document Reviewed: 03/08/2018 Elsevier Patient Education  [employer].    Patient is seek prompt reevaluation with  Dr. Teola Bradley to arrange for cataract extraction with intraocular lens placement right eye initially.  This will allow for clear visualization should vitrectomy and endolaser be undertaken for the right eye for due to vitreous hemorrhage

## 2020-01-12 ENCOUNTER — Ambulatory Visit (INDEPENDENT_AMBULATORY_CARE_PROVIDER_SITE_OTHER): Payer: Medicare Other | Admitting: Ophthalmology

## 2020-01-12 ENCOUNTER — Encounter (INDEPENDENT_AMBULATORY_CARE_PROVIDER_SITE_OTHER): Payer: Self-pay | Admitting: Ophthalmology

## 2020-01-12 ENCOUNTER — Other Ambulatory Visit: Payer: Self-pay

## 2020-01-12 DIAGNOSIS — H269 Unspecified cataract: Secondary | ICD-10-CM

## 2020-01-12 DIAGNOSIS — E113592 Type 2 diabetes mellitus with proliferative diabetic retinopathy without macular edema, left eye: Secondary | ICD-10-CM

## 2020-01-12 DIAGNOSIS — H2511 Age-related nuclear cataract, right eye: Secondary | ICD-10-CM

## 2020-01-12 DIAGNOSIS — E113591 Type 2 diabetes mellitus with proliferative diabetic retinopathy without macular edema, right eye: Secondary | ICD-10-CM

## 2020-01-12 DIAGNOSIS — Z794 Long term (current) use of insulin: Secondary | ICD-10-CM

## 2020-01-27 HISTORY — PX: CATARACT EXTRACTION: SUR2

## 2020-02-02 ENCOUNTER — Encounter (INDEPENDENT_AMBULATORY_CARE_PROVIDER_SITE_OTHER): Payer: Self-pay | Admitting: Ophthalmology

## 2020-02-02 ENCOUNTER — Other Ambulatory Visit: Payer: Self-pay

## 2020-02-02 ENCOUNTER — Ambulatory Visit (INDEPENDENT_AMBULATORY_CARE_PROVIDER_SITE_OTHER): Payer: Medicare Other | Admitting: Ophthalmology

## 2020-02-02 DIAGNOSIS — E113591 Type 2 diabetes mellitus with proliferative diabetic retinopathy without macular edema, right eye: Secondary | ICD-10-CM | POA: Diagnosis not present

## 2020-02-02 DIAGNOSIS — H4311 Vitreous hemorrhage, right eye: Secondary | ICD-10-CM

## 2020-02-02 NOTE — Progress Notes (Signed)
02/02/2020     CHIEF COMPLAINT Patient presents for Retina Follow Up   HISTORY OF PRESENT ILLNESS: Micheal [name] is a 64 y.o. male who presents to the clinic today for:   HPI    Retina Follow Up    Patient presents with  Diabetic Retinopathy.  In right eye.  Duration of 3 weeks.  Since onset it is stable.          Comments    3 week follow up- Possible PRP OD Patient denies change in vision and overall has no complaints. LBS 170 1 hour ago A1C unknown       Last edited by Gerda Diss on 02/02/2020  3:25 PM. (History)      Referring physician: Maurice Small, MD [address],  [place] [address]  HISTORICAL INFORMATION:   Selected notes from the MEDICAL RECORD NUMBER    Lab Results  Component Value Date   HGBA1C 9.0 (A) 10/27/2019     CURRENT MEDICATIONS: Current Outpatient Medications (Ophthalmic Drugs)  Medication Sig  . Prednisolon-Gatiflox-Bromfenac 1-0.5-0.075 % SUSP Place 1 drop into the right eye 4 (four) times daily.   No current facility-administered medications for this visit. (Ophthalmic Drugs)   Current Outpatient Medications (Other)  Medication Sig  . amLODipine (NORVASC) 10 MG tablet Take 10 mg by mouth daily.  [name] Kitchen aspirin 81 MG EC tablet Take by mouth.  [name] Kitchen atorvastatin (LIPITOR) 40 MG tablet Take 1 tablet (40 mg total) by mouth daily.  . calcitRIOL (ROCALTROL) 0.5 MCG capsule Take 1 capsule (0.5 mcg total) by mouth every Monday, Wednesday, and Friday with hemodialysis.  [name] Kitchen calcium acetate (PHOSLO) 667 MG capsule Take by mouth 3 (three) times daily with meals.  . Continuous Blood Gluc Sensor (FREESTYLE LIBRE 14 DAY SENSOR) MISC USE FOUR TIMES DAILY AS DIRECTED  . fluticasone (FLONASE) 50 MCG/ACT nasal spray Place 2 sprays into both nostrils daily.  [name] Kitchen glucose blood (ONETOUCH VERIO) test strip Four times daily  . hydrALAZINE (APRESOLINE) 25 MG tablet Take 1 tablet (25 mg total) by mouth 3 (three) times daily.  . Insulin  Glargine (LANTUS SOLOSTAR) 100 UNIT/ML Solostar Pen Inject 36 Units into the skin daily.  . insulin lispro (HUMALOG KWIKPEN) 100 UNIT/ML KwikPen Inject 0.16 mLs (16 Units total) into the skin 3 (three) times daily. Max daily dose 40 units  . Insulin Pen Needle (B-D UF III MINI PEN NEEDLES) 31G X 5 MM MISC USE FOUR TIMES DAILY AS DIRECTED  . isosorbide dinitrate (ISORDIL) 30 MG tablet Take 1 tablet (30 mg total) by mouth 3 (three) times daily. Please call and schedule office visit for further refills 1st attempt  . metoprolol (TOPROL-XL) 200 MG 24 hr tablet Take 1 tablet (200 mg total) by mouth daily. Please call and schedule office visit for further refills 1st attempt  . nitroGLYCERIN (NITROSTAT) 0.4 MG SL tablet Place under the tongue.  [name] Kitchen omega-3 acid ethyl esters (LOVAZA) 1 G capsule Take 1 g by mouth daily.    [name] Kitchen omeprazole (PRILOSEC) 20 MG capsule Take 20 mg by mouth daily as needed.   . sertraline (ZOLOFT) 50 MG tablet Take 0.5 tablets by mouth daily.    No current facility-administered medications for this visit. (Other)      REVIEW OF SYSTEMS:    ALLERGIES No Known Allergies  PAST MEDICAL HISTORY Past Medical History:  Diagnosis Date  . Acute respiratory failure with hypoxia ([place])   . Anemia   . CAP (  community acquired pneumonia)   . Chronic diastolic heart failure ([place])   . Chronic kidney disease    he is s/p AVF but not on HD yet  . Diabetes mellitus   . Hypertension   . Obesity (BMI 30-39.9)   . OSA (obstructive sleep apnea)    severe with AHI 72/hr   Past Surgical History:  Procedure Laterality Date  . AV FISTULA PLACEMENT    . CATARACT EXTRACTION Right 01/27/2020   Dr. Wyatt Portela  . PERCUTANEOUS CORONARY STENT INTERVENTION (PCI-S)  04/2017    FAMILY HISTORY Family History  Problem Relation Age of Onset  . Heart failure Father   . Heart disease Father   . Heart failure Other     SOCIAL HISTORY Social History   Tobacco Use  . Smoking status: Never  Smoker  . Smokeless tobacco: Never Used  Substance Use Topics  . Alcohol use: No    Alcohol/week: 0.0 standard drinks  . Drug use: No         OPHTHALMIC EXAM:  Base Eye Exam    Visual Acuity (Snellen - Linear)      Right Left   [place] 20/25 20/20-2       Tonometry (Tonopen, 3:30 PM)      Right Left   Pressure 12 13       Pupils      Pupils Dark Light Shape React APD   Right PERRL 3 3 Round Minimal None   Left PERRL 3 3 Round Minimal None       Visual Fields (Counting fingers)      Left Right    Full Full       Extraocular Movement      Right Left    Full Full       Neuro/Psych    Oriented x3: Yes   Mood/Affect: Normal       Dilation    Right eye: 1.0% Mydriacyl, 2.5% Phenylephrine @ 3:30 PM        Slit Lamp and Fundus Exam    External Exam      Right Left   External Normal Normal       Slit Lamp Exam      Right Left   Lids/Lashes Normal Normal   Conjunctiva/Sclera White and quiet White and quiet   Cornea Clear Clear   Anterior Chamber Deep and quiet Deep and quiet   Iris Round and reactive Round and reactive   Lens Posterior chamber intraocular lens 2+ Nuclear sclerosis, Cortical spokes, 2+ Cortical cataract   Anterior Vitreous Normal Normal       Fundus Exam      Right Left   Posterior Vitreous  Vitreous hemorrhage    Disc Neovascularization    C/D Ratio 0.3    Macula Normal    Vessels Retinopathy,, proliferative diabetic retinopathy active    Periphery Nasal PRP           IMAGING AND PROCEDURES  Imaging and Procedures for 02/02/20  Panretinal Photocoagulation - OD - Right Eye       Time Out Confirmed correct patient, procedure, site, and patient consented.   Anesthesia Topical anesthesia was used. Anesthetic medications included Proparacaine 0.5%.   Laser Information The type of laser was diode. Color was yellow. The duration in seconds was 0.04. The spot size was 390 microns. Laser power was 260. Total spots was 422.    Post-op The patient tolerated the procedure well. There were no complications. The  patient received written and verbal post procedure care education.   Notes PRP applied inferior retina and temporally                ASSESSMENT/PLAN:  Vitreous hemorrhage of right eye ([place]) This is clearing slightly and not impactful to the patient's vision thus will observe for now.  Evaluate in 6 to 8 weeks.      ICD-10-CM   1. 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 Vitreous hemorrhage of right eye (HCC)  H43.11     1.  OD, vitreous hemorrhage persists although most of it is old and clearing.  Currently the strands do not interfere with his visual functioning and activities of daily living. Secondary to proliferative diabetic retinopathy.  Some clearing temporally and will deliver PRP OD today.  2.  3.  Ophthalmic Meds Ordered this visit:  No orders of the defined types were placed in this encounter.      Return in about 6 weeks (around 03/15/2020) for DILATE OU, COLOR FP.  There are no Patient Instructions on file for this visit.   Explained the diagnoses, plan, and follow up with the patient and they expressed understanding.  Patient expressed understanding of the importance of proper follow up care.   Clent Demark Minervia Osso M.D. Diseases & Surgery of the Retina and Vitreous Retina & Diabetic [place] 02/02/20     Abbreviations: M myopia (nearsighted); A astigmatism; H hyperopia (farsighted); P presbyopia; Mrx spectacle prescription;  CTL contact lenses; OD right eye; OS left eye; OU both eyes  XT exotropia; ET esotropia; PEK punctate epithelial keratitis; PEE punctate epithelial erosions; DES dry eye syndrome; MGD meibomian gland dysfunction; ATs artificial tears; PFAT's preservative free artificial tears;  [place] nuclear sclerotic cataract; PSC posterior subcapsular cataract; ERM epi-retinal membrane; PVD posterior vitreous detachment; RD retinal detachment; DM diabetes mellitus; DR diabetic retinopathy; NPDR non-proliferative diabetic retinopathy; PDR proliferative diabetic retinopathy; CSME clinically significant macular edema; DME diabetic macular edema; dbh dot blot hemorrhages; CWS cotton wool spot; POAG primary open angle glaucoma; C/D cup-to-disc ratio; HVF humphrey visual field; GVF goldmann visual field; OCT optical coherence tomography; IOP intraocular pressure; BRVO Branch retinal vein occlusion; CRVO central retinal vein occlusion; CRAO central retinal artery occlusion; BRAO branch retinal artery occlusion; RT retinal tear; SB scleral buckle; PPV pars plana vitrectomy; VH Vitreous hemorrhage; PRP panretinal laser photocoagulation; IVK intravitreal kenalog; VMT vitreomacular traction; MH Macular hole;  NVD neovascularization of the disc; NVE neovascularization elsewhere; AREDS age related eye disease study; ARMD age related macular degeneration; POAG primary open angle glaucoma; EBMD epithelial/anterior basement membrane dystrophy; ACIOL anterior chamber intraocular lens; IOL intraocular lens; PCIOL posterior chamber intraocular lens; Phaco/IOL phacoemulsification with intraocular lens placement; [place] photorefractive keratectomy; LASIK laser assisted in situ keratomileusis; HTN hypertension; DM diabetes mellitus; COPD chronic obstructive pulmonary disease

## 2020-02-02 NOTE — Assessment & Plan Note (Signed)
This is clearing slightly and not impactful to the patient's vision thus will observe for now.  Evaluate in 6 to 8 weeks.

## 2020-02-22 NOTE — Progress Notes (Signed)
Name: Micheal [name]  Age/ Sex: 64 y.o., male   MRN/ DOB: [id number], Feb 10, 1956     PCP: Maurice Small, MD   Reason for Endocrinology Evaluation: Type 2 Diabetes Mellitus  Initial Endocrine Consultative Visit: 09/28/2018    PATIENT IDENTIFIER: Micheal [name] is a 64 y.o. male with a past medical history of DM, ESRD on HD (started on 03/2016), HTN, OSA. The patient has followed with Endocrinology clinic since 09/28/2018 for consultative assistance with management of his diabetes.  DIABETIC HISTORY:  Micheal [name] was diagnosed with T2DM 1992,has tried Metformin and  Glipizide in the past. Started insulin therapy years ago. His hemoglobin A1c has ranged from 6.7% in 2017, peaking at 12.3 %in 07/2018 .   SUBJECTIVE:   During the last visit (10/27/2019): A1c 9.0 %. Switched  Basaglar to lantus 36  units daily and increased Humalog to 16 units TID QAC   Today (02/23/2020): Micheal [name] is here for a follow up on diabetes .  He checks his blood sugars sporadically through the freestyle libre. The patient has not had hypoglycemic episodes since the last clinic visit.        HOME DIABETES REGIMEN:   Lantus 36 units at bedtime  Humalog 16 units three times a day with meal     CONTINUOUS GLUCOSE MONITORING RECORD INTERPRETATION    Dates of Recording:4/5-02/23/2020  Sensor description: freestyle libre   Results statistics:   CGM use % of time 62  Average and SD 225/32.6  Time in range  27  %  % Time Above 180 41  % Time above 250 32  % Time Below target 0    Glycemic patterns summary:  Hyperglycemia through the day   Hypoglycemic episodes occurred none Overnight periods: high     DIABETIC COMPLICATIONS: Microvascular complications:   CKD , retinopathy on the right per pt S/P laser, neuropathy based on exam    Last eye exam: Completed 02/02/2020  Macrovascular complications:   CHF   Denies: PVD, CVA  HISTORY:  Past Medical History:  Past Medical  History:  Diagnosis Date  . Acute respiratory failure with hypoxia ([place])   . Anemia   . CAP (community acquired pneumonia)   . Chronic diastolic heart failure ([place])   . Chronic kidney disease    he is s/p AVF but not on HD yet  . Diabetes mellitus   . Hypertension   . Obesity (BMI 30-39.9)   . OSA (obstructive sleep apnea)    severe with AHI 72/hr   Past Surgical History:  Past Surgical History:  Procedure Laterality Date  . AV FISTULA PLACEMENT    . CATARACT EXTRACTION Right 01/27/2020   Dr. Wyatt Portela  . PERCUTANEOUS CORONARY STENT INTERVENTION (PCI-S)  04/2017    Social History:  reports that he has never smoked. He has never used smokeless tobacco. He reports that he does not drink alcohol or use drugs. Family History:  Family History  Problem Relation Age of Onset  . Heart failure Father   . Heart disease Father   . Heart failure Other      HOME MEDICATIONS: Allergies as of 02/23/2020   No Known Allergies     Medication List       Accurate as of February 23, 2020  1:05 PM. If you have any questions, ask your nurse or doctor.        STOP taking these medications   insulin lispro 100 UNIT/ML KwikPen Commonly known as: HumaLOG  KwikPen Replaced by: HumaLOG KwikPen 200 UNIT/ML KwikPen Stopped by: Dorita Sciara, MD     TAKE these medications   amLODipine 10 MG tablet Commonly known as: NORVASC Take 10 mg by mouth daily.   aspirin 81 MG EC tablet Take by mouth.   atorvastatin 40 MG tablet Commonly known as: LIPITOR Take 1 tablet (40 mg total) by mouth daily.   B-D UF III MINI PEN NEEDLES 31G X 5 MM Misc Generic drug: Insulin Pen Needle USE FOUR TIMES DAILY AS DIRECTED   calcitRIOL 0.5 MCG capsule Commonly known as: ROCALTROL Take 1 capsule (0.5 mcg total) by mouth every Monday, Wednesday, and Friday with hemodialysis.   calcium acetate 667 MG capsule Commonly known as: PHOSLO Take by mouth 3 (three) times daily with meals.   fluticasone 50  MCG/ACT nasal spray Commonly known as: FLONASE Place 2 sprays into both nostrils daily.   FreeStyle Libre 14 Day Sensor Misc USE FOUR TIMES DAILY AS DIRECTED   glucose blood test strip Commonly known as: OneTouch Verio Four times daily   HumaLOG KwikPen 200 UNIT/ML KwikPen Generic drug: insulin lispro Inject 16 Units into the skin with breakfast, with lunch, and with evening meal. Max daily 90 units to include correctional scale Replaces: insulin lispro 100 UNIT/ML KwikPen Started by: Dorita Sciara, MD   hydrALAZINE 25 MG tablet Commonly known as: APRESOLINE Take 1 tablet (25 mg total) by mouth 3 (three) times daily.   isosorbide dinitrate 30 MG tablet Commonly known as: ISORDIL Take 1 tablet (30 mg total) by mouth 3 (three) times daily. Please call and schedule office visit for further refills 1st attempt   Lantus SoloStar 100 UNIT/ML Solostar Pen Generic drug: insulin glargine Inject 36 Units into the skin daily.   metoprolol 200 MG 24 hr tablet Commonly known as: TOPROL-XL Take 1 tablet (200 mg total) by mouth daily. Please call and schedule office visit for further refills 1st attempt   nitroGLYCERIN 0.4 MG SL tablet Commonly known as: NITROSTAT Place under the tongue.   omega-3 acid ethyl esters 1 g capsule Commonly known as: LOVAZA Take 1 g by mouth daily.   omeprazole 20 MG capsule Commonly known as: PRILOSEC Take 20 mg by mouth daily as needed.   Prednisolon-Gatiflox-Bromfenac 1-0.5-0.075 % Susp Place 1 drop into the right eye 4 (four) times daily.   sertraline 50 MG tablet Commonly known as: ZOLOFT Take 0.5 tablets by mouth daily.       OBJECTIVE:   Vital Signs: BP 132/68 (BP Location: Right Arm, Patient Position: Sitting, Cuff Size: Large)   Pulse 68   Temp 98.3 F (36.8 C)   Ht 5\' 8"  (1.727 m)   Wt 236 lb 6.4 oz (107.2 kg)   SpO2 95%   BMI 35.94 kg/m    General: Pt appears well and is in NAD  Lungs: Clear with good BS bilat with  no rales, rhonchi, or wheezes  Heart: RRR with normal S1 and S2 and no gallops; no murmurs; no rub  Abdomen: soft, nontender, RLQ lipohypertrophic area  Extremities: No pretibial edema.  Neuro: MS is good with appropriate affect, pt is alert and Ox3      DM foot exam:10/27/2019 The skin of the feet is intact without sores or ulcerations. The pedal pulses are 2+ on right and 2+ on left. The sensation is decreased to a screening 5.07, 10 gram monofilament on the left       DATA REVIEWED:    10/26/2019 9.11 (  H) 0.50 - 1.30 mg/dL INFIAN TIME   Potassium, Bld 4.9 (H) 3.6 - 5.1 mmol/L   Chloride 101 (H) 97 - 110 mmol/L   CO2 Content 22 (H) 22 - 32 mmol/L   Calcium 9.5 (H) 8.9 - 10.3 mg/dL   Phosphorus 6.0 (H) 2.5 - 5.5 mg/dL   CA X PHOS 56.9 (H) 15.0 - 75.0   Alkaline Phosphatase 57 (H) 38 - 195 IU/L   SGOT (AST) 14 (H) 12 - 50 IU/L   LDH 185 (H) 140 - 271 IU/L   PROTEIN  7.5 (H) 6.2 - 8.3 g/dL   ALBUMIN 3.8 (H) 3.5 - 5.2 g/dL   Albumin/Globulin Ratio 1.0 (L) 1.1 - 1.9 Ratio   Sodium 139 (L) 136 - 144 mmol/L   ALT 16 (L) 17 - 63 IU/L      ASSESSMENT / PLAN / RECOMMENDATIONS:   1) Type 2 Diabetes Mellitus, Poorly controlled, With Renal complications (ESRD on HD), neuropathic and retinal complication - Most recent A1c of 9.3 %. Goal A1c < 7.0 %.     -Patient continues with hyperglycemia due to medication noncompliance and dietary indiscretions.  He continues to forget to take prandial insulin, he was again advised to take humalog with a meal and not later.  - I went over with the patient various strategies as to how he can be reminded to take the second dose of both medications during the day given his schedule   MEDICATIONS:  Increase  Lantus 42 units daily  Continue  Humalog  16 units 3 times daily with meals  CF: Humalog (BG-130/20)   EDUCATION / INSTRUCTIONS:  BG monitoring instructions: Patient is instructed to check his blood sugars 4 times a day,  before each meal and bedtime.  Call [facility] if: BG persistently < 70 or > 300. . I reviewed the Rule of 15 for the treatment of hypoglycemia in detail with the patient. Literature supplied.    2) Diabetic complications:   Eye: Does have known diabetic retinopathy.   Neuro/ Feet: Does have known diabetic peripheral neuropathy based on foot exam on 10/27/2019  Renal: Patient does  have known ESRD, patient is on HD   F/U in 4 months    Signed electronically by: Mack Guise, MD  [facility]  [address] [place], [place] [address] Phone: [phone] FAX: [phone]   CC: Maurice Small, MD [address] [place] [address] Phone: [phone]  Fax: [phone]  Return to Endocrinology clinic as below: Future Appointments  Date Time Provider [place]  03/15/2020  3:30 PM Zadie Rhine Clent Demark, MD [facility] None  06/28/2020 11:10 AM Kindle Strohmeier, Melanie Crazier, MD [facility] None

## 2020-02-23 ENCOUNTER — Other Ambulatory Visit: Payer: Self-pay

## 2020-02-23 ENCOUNTER — Ambulatory Visit (INDEPENDENT_AMBULATORY_CARE_PROVIDER_SITE_OTHER): Payer: Medicare Other | Admitting: Internal Medicine

## 2020-02-23 VITALS — BP 132/68 | HR 68 | Temp 98.3°F | Ht 68.0 in | Wt 236.4 lb

## 2020-02-23 DIAGNOSIS — E1122 Type 2 diabetes mellitus with diabetic chronic kidney disease: Secondary | ICD-10-CM

## 2020-02-23 DIAGNOSIS — Z794 Long term (current) use of insulin: Secondary | ICD-10-CM

## 2020-02-23 DIAGNOSIS — E1142 Type 2 diabetes mellitus with diabetic polyneuropathy: Secondary | ICD-10-CM | POA: Diagnosis not present

## 2020-02-23 DIAGNOSIS — Z992 Dependence on renal dialysis: Secondary | ICD-10-CM | POA: Diagnosis not present

## 2020-02-23 DIAGNOSIS — E113591 Type 2 diabetes mellitus with proliferative diabetic retinopathy without macular edema, right eye: Secondary | ICD-10-CM | POA: Diagnosis not present

## 2020-02-23 DIAGNOSIS — E1165 Type 2 diabetes mellitus with hyperglycemia: Secondary | ICD-10-CM

## 2020-02-23 DIAGNOSIS — N186 End stage renal disease: Secondary | ICD-10-CM

## 2020-02-23 LAB — POCT GLYCOSYLATED HEMOGLOBIN (HGB A1C): Hemoglobin A1C: 9.3 % — AB (ref 4.0–5.6)

## 2020-02-23 MED ORDER — LANTUS SOLOSTAR 100 UNIT/ML ~~LOC~~ SOPN: 36.0000 [IU] | PEN_INJECTOR | Freq: Every day | SUBCUTANEOUS | 6 refills | Status: DC

## 2020-02-23 MED ORDER — HUMALOG KWIKPEN 200 UNIT/ML ~~LOC~~ SOPN: 16.0000 [IU] | PEN_INJECTOR | Freq: Three times a day (TID) | SUBCUTANEOUS | 6 refills | Status: DC

## 2020-02-23 NOTE — Patient Instructions (Addendum)
-   Increase Lantus to 42 units daily  - Continue  Humalog  16 units WITH each meal  - Humalog correctional insulin: ADD extra units on insulin to your meal-time Humalog dose if your blood sugars are higher than 150. Use the scale below to help guide you:   Blood sugar before meal Number of units to inject  Less than 150 0 unit  151 -  170 1 units  171 -  190 2 units  191 -  210 3 units  211 -  230 4 units  231 -  250 5 units  251 -  270 6 units  271-  290 7 units  291 -  310 8 units  311- 330  9 units  331- 350 10 units   351- 370 11 units   371- 390 12 units   391- 410 13 units   411- 430 14 units         HOW TO TREAT LOW BLOOD SUGARS (Blood sugar LESS THAN 70 MG/DL)  Please follow the RULE OF 15 for the treatment of hypoglycemia treatment (when your (blood sugars are less than 70 mg/dL)    STEP 1: Take 15 grams of carbohydrates when your blood sugar is low, which includes:   3-4 GLUCOSE TABS  OR  3-4 OZ OF JUICE OR REGULAR SODA OR  ONE TUBE OF GLUCOSE GEL     STEP 2: RECHECK blood sugar in 15 MINUTES STEP 3: If your blood sugar is still low at the 15 minute recheck --> then, go back to STEP 1 and treat AGAIN with another 15 grams of carbohydrates.

## 2020-02-28 ENCOUNTER — Ambulatory Visit: Payer: Self-pay | Admitting: Internal Medicine

## 2020-03-15 ENCOUNTER — Other Ambulatory Visit: Payer: Self-pay

## 2020-03-15 ENCOUNTER — Encounter (INDEPENDENT_AMBULATORY_CARE_PROVIDER_SITE_OTHER): Payer: Self-pay | Admitting: Ophthalmology

## 2020-03-15 ENCOUNTER — Ambulatory Visit (INDEPENDENT_AMBULATORY_CARE_PROVIDER_SITE_OTHER): Payer: Medicare Other | Admitting: Ophthalmology

## 2020-03-15 DIAGNOSIS — H4311 Vitreous hemorrhage, right eye: Secondary | ICD-10-CM | POA: Diagnosis not present

## 2020-03-15 DIAGNOSIS — E113591 Type 2 diabetes mellitus with proliferative diabetic retinopathy without macular edema, right eye: Secondary | ICD-10-CM | POA: Diagnosis not present

## 2020-03-15 DIAGNOSIS — Z794 Long term (current) use of insulin: Secondary | ICD-10-CM | POA: Diagnosis not present

## 2020-03-15 MED ORDER — OFLOXACIN 0.3 % OP SOLN: 1.0000 [drp] | Freq: Four times a day (QID) | OPHTHALMIC | 0 refills | Status: DC

## 2020-03-15 MED ORDER — PREDNISOLONE ACETATE 1 % OP SUSP: 1.0000 [drp] | Freq: Four times a day (QID) | OPHTHALMIC | 0 refills | Status: DC

## 2020-03-15 NOTE — Patient Instructions (Signed)
Risks and benefits were reviewed with the patient regarding surgical intervention.

## 2020-03-15 NOTE — Progress Notes (Signed)
03/15/2020     CHIEF COMPLAINT Patient presents for Retina Follow Up   HISTORY OF PRESENT ILLNESS: Micheal [name] is a 64 y.o. male who presents to the clinic today for:   HPI    Retina Follow Up    Patient presents with  Diabetic Retinopathy.  In both eyes.  Duration of 6 weeks.  Since onset it is stable.          Comments    6 week f.u- FP OU Patient states that a few days ago his eye began bleeding again, stating he sees several floaters. LBS 200 /// A1C 9.6       Last edited by Gerda Diss on 03/15/2020  3:37 PM. (History)        HISTORICAL INFORMATION:   Selected notes from the MEDICAL RECORD NUMBER    Lab Results  Component Value Date   HGBA1C 9.3 (A) 02/23/2020     CURRENT MEDICATIONS: Current Outpatient Medications (Ophthalmic Drugs)  Medication Sig  . [START ON 03/19/2020] ofloxacin (OCUFLOX) 0.3 % ophthalmic solution Place 1 drop into the right eye 4 (four) times daily.  . Prednisolon-Gatiflox-Bromfenac 1-0.5-0.075 % SUSP Place 1 drop into the right eye 4 (four) times daily.  Derrill Memo ON 03/19/2020] prednisoLONE acetate (PRED FORTE) 1 % ophthalmic suspension Place 1 drop into the right eye 4 (four) times daily.   No current facility-administered medications for this visit. (Ophthalmic Drugs)   Current Outpatient Medications (Other)  Medication Sig  . amLODipine (NORVASC) 10 MG tablet Take 10 mg by mouth daily.  [name] Kitchen aspirin 81 MG EC tablet Take by mouth.  [name] Kitchen atorvastatin (LIPITOR) 40 MG tablet Take 1 tablet (40 mg total) by mouth daily.  . calcitRIOL (ROCALTROL) 0.5 MCG capsule Take 1 capsule (0.5 mcg total) by mouth every Monday, Wednesday, and Friday with hemodialysis.  [name] Kitchen calcium acetate (PHOSLO) 667 MG capsule Take by mouth 3 (three) times daily with meals.  . Continuous Blood Gluc Sensor (FREESTYLE LIBRE 14 DAY SENSOR) MISC USE FOUR TIMES DAILY AS DIRECTED  . fluticasone (FLONASE) 50 MCG/ACT nasal spray Place 2 sprays into both nostrils daily.  [name] Kitchen  glucose blood (ONETOUCH VERIO) test strip Four times daily  . hydrALAZINE (APRESOLINE) 25 MG tablet Take 1 tablet (25 mg total) by mouth 3 (three) times daily.  . insulin glargine (LANTUS SOLOSTAR) 100 UNIT/ML Solostar Pen Inject 36 Units into the skin daily.  . insulin lispro (HUMALOG KWIKPEN) 200 UNIT/ML KwikPen Inject 16 Units into the skin with breakfast, with lunch, and with evening meal. Max daily 90 units to include correctional scale  . Insulin Pen Needle (B-D UF III MINI PEN NEEDLES) 31G X 5 MM MISC USE FOUR TIMES DAILY AS DIRECTED  . isosorbide dinitrate (ISORDIL) 30 MG tablet Take 1 tablet (30 mg total) by mouth 3 (three) times daily. Please call and schedule office visit for further refills 1st attempt  . metoprolol (TOPROL-XL) 200 MG 24 hr tablet Take 1 tablet (200 mg total) by mouth daily. Please call and schedule office visit for further refills 1st attempt  . nitroGLYCERIN (NITROSTAT) 0.4 MG SL tablet Place under the tongue.  [name] Kitchen omega-3 acid ethyl esters (LOVAZA) 1 G capsule Take 1 g by mouth daily.    [name] Kitchen omeprazole (PRILOSEC) 20 MG capsule Take 20 mg by mouth daily as needed.   . sertraline (ZOLOFT) 50 MG tablet Take 0.5 tablets by mouth daily.    No current facility-administered medications for this visit. (Other)  ALLERGIES No Known Allergies  PAST MEDICAL HISTORY Past Medical History:  Diagnosis Date  . Acute respiratory failure with hypoxia ([place])   . Anemia   . CAP (community acquired pneumonia)   . Chronic diastolic heart failure ([place])   . Chronic kidney disease    he is s/p AVF but not on HD yet  . Diabetes mellitus   . Hypertension   . Obesity (BMI 30-39.9)   . OSA (obstructive sleep apnea)    severe with AHI 72/hr   Past Surgical History:  Procedure Laterality Date  . AV FISTULA PLACEMENT    . CATARACT EXTRACTION Right 01/27/2020   Dr. Antwuan Eckley Portela  . PERCUTANEOUS CORONARY STENT INTERVENTION (PCI-S)  04/2017    FAMILY HISTORY Family History    Problem Relation Age of Onset  . Heart failure Father   . Heart disease Father   . Heart failure Other     SOCIAL HISTORY Social History   Tobacco Use  . Smoking status: Never Smoker  . Smokeless tobacco: Never Used  Substance Use Topics  . Alcohol use: No    Alcohol/week: 0.0 standard drinks  . Drug use: No         OPHTHALMIC EXAM: Base Eye Exam    Visual Acuity (Snellen - Linear)      Right Left   Dist [place] 20/200 20/20-2   Dist ph [place] NI        Tonometry (Tonopen, 3:40 PM)      Right Left   Pressure 14 14       Pupils      Pupils Dark Light Shape React APD   Right PERRL 3 3 Round Minimal None   Left PERRL 3 3 Round Minimal None       Visual Fields (Counting fingers)      Left Right    Full Full       Extraocular Movement      Right Left    Full Full       Neuro/Psych    Oriented x3: Yes   Mood/Affect: Normal       Dilation    Both eyes: 1.0% Mydriacyl, 2.5% Phenylephrine @ 3:40 PM        Slit Lamp and Fundus Exam    External Exam      Right Left   External Normal Normal       Slit Lamp Exam      Right Left   Lids/Lashes Normal Normal   Conjunctiva/Sclera White and quiet White and quiet   Cornea Clear Clear   Anterior Chamber Deep and quiet Deep and quiet   Iris Round and reactive Round and reactive   Lens Posterior chamber intraocular lens 2+ Nuclear sclerosis, Cortical spokes, 2+ Cortical cataract   Anterior Vitreous Normal Normal       Fundus Exam      Right Left   Posterior Vitreous  Vitreous hemorrhage 3+    Disc Neovascularization    C/D Ratio 0.3    Macula Poor view of details    Vessels Retinopathy,, proliferative diabetic retinopathy active    Periphery Poor view of details           IMAGING AND PROCEDURES  Imaging and Procedures for @TODAY @  Color Fundus Photography Optos - OU - Both Eyes       Right Eye Progression has worsened. Macula : normal observations. Vessels : Neovascularization.   Left  Eye Progression has been stable. Disc findings include normal  observations. Macula : microaneurysms. Vessels : normal observations.   Notes Deep, with new dense vitreous hemorrhage, obscures retinal periphery.                ASSESSMENT/PLAN:    ICD-10-CM   1. 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 Vitreous hemorrhage of right eye (HCC)  H43.11 Color Fundus Photography Optos - OU - Both Eyes    Ophthalmic Meds Ordered this visit:  Meds ordered this encounter  Medications  . prednisoLONE acetate (PRED FORTE) 1 % ophthalmic suspension    Sig: Place 1 drop into the right eye 4 (four) times daily.    Dispense:  5 mL    Refill:  0  . ofloxacin (OCUFLOX) 0.3 % ophthalmic solution    Sig: Place 1 drop into the right eye 4 (four) times daily.    Dispense:  5 mL    Refill:  0        Pre-op completed. Operative consent obtained with pre-op eye drops reviewed with Sheilah Mins Carelli and sent via [place] as needed. Post op instructions reviewed with patient and per patient all questions answered.  Gerda Diss

## 2020-03-15 NOTE — Progress Notes (Signed)
03/15/2020     CHIEF COMPLAINT Patient presents for Retina Follow Up   HISTORY OF PRESENT ILLNESS: Micheal [name] is a 64 y.o. male who presents to the clinic today for:   HPI    Retina Follow Up    Patient presents with  Diabetic Retinopathy.  In both eyes.  Duration of 6 weeks.  Since onset it is stable.          Comments    6 week f.u- FP OU Patient states that a few days ago his eye began bleeding again, stating he sees several floaters. LBS 200 /// A1C 9.6       Last edited by Gerda Diss on 03/15/2020  3:37 PM. (History)      Referring physician: Maurice Small, MD [address],  [place] [address]  HISTORICAL INFORMATION:   Selected notes from the MEDICAL RECORD NUMBER    Lab Results  Component Value Date   HGBA1C 9.3 (A) 02/23/2020     CURRENT MEDICATIONS: Current Outpatient Medications (Ophthalmic Drugs)  Medication Sig  . Prednisolon-Gatiflox-Bromfenac 1-0.5-0.075 % SUSP Place 1 drop into the right eye 4 (four) times daily.   No current facility-administered medications for this visit. (Ophthalmic Drugs)   Current Outpatient Medications (Other)  Medication Sig  . amLODipine (NORVASC) 10 MG tablet Take 10 mg by mouth daily.  [name] Kitchen aspirin 81 MG EC tablet Take by mouth.  [name] Kitchen atorvastatin (LIPITOR) 40 MG tablet Take 1 tablet (40 mg total) by mouth daily.  . calcitRIOL (ROCALTROL) 0.5 MCG capsule Take 1 capsule (0.5 mcg total) by mouth every Monday, Wednesday, and Friday with hemodialysis.  [name] Kitchen calcium acetate (PHOSLO) 667 MG capsule Take by mouth 3 (three) times daily with meals.  . Continuous Blood Gluc Sensor (FREESTYLE LIBRE 14 DAY SENSOR) MISC USE FOUR TIMES DAILY AS DIRECTED  . fluticasone (FLONASE) 50 MCG/ACT nasal spray Place 2 sprays into both nostrils daily.  [name] Kitchen glucose blood (ONETOUCH VERIO) test strip Four times daily  . hydrALAZINE (APRESOLINE) 25 MG tablet Take 1 tablet (25 mg total) by mouth 3 (three) times daily.  .  insulin glargine (LANTUS SOLOSTAR) 100 UNIT/ML Solostar Pen Inject 36 Units into the skin daily.  . insulin lispro (HUMALOG KWIKPEN) 200 UNIT/ML KwikPen Inject 16 Units into the skin with breakfast, with lunch, and with evening meal. Max daily 90 units to include correctional scale  . Insulin Pen Needle (B-D UF III MINI PEN NEEDLES) 31G X 5 MM MISC USE FOUR TIMES DAILY AS DIRECTED  . isosorbide dinitrate (ISORDIL) 30 MG tablet Take 1 tablet (30 mg total) by mouth 3 (three) times daily. Please call and schedule office visit for further refills 1st attempt  . metoprolol (TOPROL-XL) 200 MG 24 hr tablet Take 1 tablet (200 mg total) by mouth daily. Please call and schedule office visit for further refills 1st attempt  . nitroGLYCERIN (NITROSTAT) 0.4 MG SL tablet Place under the tongue.  [name] Kitchen omega-3 acid ethyl esters (LOVAZA) 1 G capsule Take 1 g by mouth daily.    [name] Kitchen omeprazole (PRILOSEC) 20 MG capsule Take 20 mg by mouth daily as needed.   . sertraline (ZOLOFT) 50 MG tablet Take 0.5 tablets by mouth daily.    No current facility-administered medications for this visit. (Other)      REVIEW OF SYSTEMS:    ALLERGIES No Known Allergies  PAST MEDICAL HISTORY Past Medical History:  Diagnosis Date  . Acute respiratory failure with hypoxia ([place])   .  Anemia   . CAP (community acquired pneumonia)   . Chronic diastolic heart failure ([place])   . Chronic kidney disease    he is s/p AVF but not on HD yet  . Diabetes mellitus   . Hypertension   . Obesity (BMI 30-39.9)   . OSA (obstructive sleep apnea)    severe with AHI 72/hr   Past Surgical History:  Procedure Laterality Date  . AV FISTULA PLACEMENT    . CATARACT EXTRACTION Right 01/27/2020   Dr. Wyatt Portela  . PERCUTANEOUS CORONARY STENT INTERVENTION (PCI-S)  04/2017    FAMILY HISTORY Family History  Problem Relation Age of Onset  . Heart failure Father   . Heart disease Father   . Heart failure Other     SOCIAL HISTORY Social History    Tobacco Use  . Smoking status: Never Smoker  . Smokeless tobacco: Never Used  Substance Use Topics  . Alcohol use: No    Alcohol/week: 0.0 standard drinks  . Drug use: No         OPHTHALMIC EXAM:  Base Eye Exam    Visual Acuity (Snellen - Linear)      Right Left   Dist [place] 20/200 20/20-2   Dist ph [place] NI        Tonometry (Tonopen, 3:40 PM)      Right Left   Pressure 14 14       Pupils      Pupils Dark Light Shape React APD   Right PERRL 3 3 Round Minimal None   Left PERRL 3 3 Round Minimal None       Visual Fields (Counting fingers)      Left Right    Full Full       Extraocular Movement      Right Left    Full Full       Neuro/Psych    Oriented x3: Yes   Mood/Affect: Normal       Dilation    Both eyes: 1.0% Mydriacyl, 2.5% Phenylephrine @ 3:40 PM        Slit Lamp and Fundus Exam    External Exam      Right Left   External Normal Normal       Slit Lamp Exam      Right Left   Lids/Lashes Normal Normal   Conjunctiva/Sclera White and quiet White and quiet   Cornea Clear Clear   Anterior Chamber Deep and quiet Deep and quiet   Iris Round and reactive Round and reactive   Lens Posterior chamber intraocular lens 2+ Nuclear sclerosis, Cortical spokes, 2+ Cortical cataract   Anterior Vitreous Normal Normal       Fundus Exam      Right Left   Posterior Vitreous  Vitreous hemorrhage 3+    Disc Neovascularization    C/D Ratio 0.3    Macula Poor view of details    Vessels Retinopathy,, proliferative diabetic retinopathy active    Periphery Poor view of details           IMAGING AND PROCEDURES  Imaging and Procedures for 03/15/20  Color Fundus Photography Optos - OU - Both Eyes       Right Eye Progression has worsened. Macula : normal observations. Vessels : Neovascularization.   Left Eye Progression has been stable. Disc findings include normal observations. Macula : microaneurysms. Vessels : normal observations.   Notes Deep, with  new dense vitreous hemorrhage, obscures retinal periphery.  ASSESSMENT/PLAN:  Vitreous hemorrhage of right eye ([place]) The nature of the vitreous hemorrhage was discussed with the patient as well as the common causes.   Patients with diabetic eye disease may develop retinal neovascularization.  Other eye conditions develop retinal  neovascularization secondary to retinal venous occlusions.   Vitreous hemorrhage may result from spontaneous vitreous detachment or retinal breaks.  Blunt trauma is a common cause as well.  The need for serial evaluation of the fundus (interior of the eye) until  clear views are obtained was addressed. An occasional need  to monitor the condition by in office  B-scan ultrasonography in the case of dense vitreous hemorrhage was discussed.  Recurrent vitreous hemorrhage now will need to proceed with vitrectomy for long-term solution of this problem OD      ICD-10-CM   1. 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 Vitreous hemorrhage of right eye (HCC)  H43.11 Color Fundus Photography Optos - OU - Both Eyes    1.  2.  3.  Ophthalmic Meds Ordered this visit:  No orders of the defined types were placed in this encounter.      Return for OD, schedule vitrectomy, panretinal endolaser right eye, local MAC, [facility].  Patient Instructions  Risks and benefits were reviewed with the patient regarding surgical intervention.    Explained the diagnoses, plan, and follow up with the patient and they expressed understanding.  Patient expressed understanding of the importance of proper follow up care.   Clent Demark Davionte Lusby M.D. Diseases & Surgery of the Retina and Vitreous Retina & Diabetic [place] 03/15/20     Abbreviations: M myopia (nearsighted); A astigmatism;  H hyperopia (farsighted); P presbyopia; Mrx spectacle prescription;  CTL contact lenses; OD right eye; OS left eye; OU both eyes  XT exotropia; ET esotropia; PEK punctate epithelial keratitis; PEE punctate epithelial erosions; DES dry eye syndrome; MGD meibomian gland dysfunction; ATs artificial tears; PFAT's preservative free artificial tears; [place] nuclear sclerotic cataract; PSC posterior subcapsular cataract; ERM epi-retinal membrane; PVD posterior vitreous detachment; RD retinal detachment; DM diabetes mellitus; DR diabetic retinopathy; NPDR non-proliferative diabetic retinopathy; PDR proliferative diabetic retinopathy; CSME clinically significant macular edema; DME diabetic macular edema; dbh dot blot hemorrhages; CWS cotton wool spot; POAG primary open angle glaucoma; C/D cup-to-disc ratio; HVF humphrey visual field; GVF goldmann visual field; OCT optical coherence tomography; IOP intraocular pressure; BRVO Branch retinal vein occlusion; CRVO central retinal vein occlusion; CRAO central retinal artery occlusion; BRAO branch retinal artery occlusion; RT retinal tear; SB scleral buckle; PPV pars plana vitrectomy; VH Vitreous hemorrhage; PRP panretinal laser photocoagulation; IVK intravitreal kenalog; VMT vitreomacular traction; MH Macular hole;  NVD neovascularization of the disc; NVE neovascularization elsewhere; AREDS age related eye disease study; ARMD age related macular degeneration; POAG primary open angle glaucoma; EBMD epithelial/anterior basement membrane dystrophy; ACIOL anterior chamber intraocular lens; IOL intraocular lens; PCIOL posterior chamber intraocular lens; Phaco/IOL phacoemulsification with intraocular lens placement; [place] photorefractive keratectomy; LASIK laser assisted in situ keratomileusis; HTN hypertension; DM diabetes mellitus; COPD chronic obstructive pulmonary disease

## 2020-03-15 NOTE — Addendum Note (Signed)
Addended by: Argie Ramming on: 03/15/2020 05:17 PM   Modules accepted: Orders

## 2020-03-15 NOTE — Assessment & Plan Note (Addendum)
The nature of the vitreous hemorrhage was discussed with the patient as well as the common causes.   Patients with diabetic eye disease may develop retinal neovascularization.  Other eye conditions develop retinal  neovascularization secondary to retinal venous occlusions.   Vitreous hemorrhage may result from spontaneous vitreous detachment or retinal breaks.  Blunt trauma is a common cause as well.  The need for serial evaluation of the fundus (interior of the eye) until  clear views are obtained was addressed. An occasional need  to monitor the condition by in office  B-scan ultrasonography in the case of dense vitreous hemorrhage was discussed.  Recurrent vitreous hemorrhage now will need to proceed with vitrectomy for long-term solution of this problem OD

## 2020-03-21 ENCOUNTER — Encounter (AMBULATORY_SURGERY_CENTER): Payer: Medicare Other | Admitting: Ophthalmology

## 2020-03-21 ENCOUNTER — Telehealth: Payer: Self-pay | Admitting: Internal Medicine

## 2020-03-21 DIAGNOSIS — E113591 Type 2 diabetes mellitus with proliferative diabetic retinopathy without macular edema, right eye: Secondary | ICD-10-CM | POA: Diagnosis not present

## 2020-03-21 DIAGNOSIS — H4311 Vitreous hemorrhage, right eye: Secondary | ICD-10-CM | POA: Diagnosis not present

## 2020-03-21 NOTE — Telephone Encounter (Signed)
[name] is in lobby requesting a signature for these supplies for this pt

## 2020-03-21 NOTE — Telephone Encounter (Signed)
Diabetes Manager came in on behalf of patient to request Omnipods DASH Pods (5pk) every 48 hrs (9 boxes/90 days) for Diabetes management

## 2020-03-22 ENCOUNTER — Encounter (INDEPENDENT_AMBULATORY_CARE_PROVIDER_SITE_OTHER): Payer: Self-pay | Admitting: Ophthalmology

## 2020-03-22 ENCOUNTER — Ambulatory Visit (INDEPENDENT_AMBULATORY_CARE_PROVIDER_SITE_OTHER): Payer: 59 | Admitting: Ophthalmology

## 2020-03-22 ENCOUNTER — Other Ambulatory Visit: Payer: Self-pay

## 2020-03-22 DIAGNOSIS — Z09 Encounter for follow-up examination after completed treatment for conditions other than malignant neoplasm: Secondary | ICD-10-CM

## 2020-03-22 DIAGNOSIS — H4311 Vitreous hemorrhage, right eye: Secondary | ICD-10-CM

## 2020-03-22 DIAGNOSIS — Z794 Long term (current) use of insulin: Secondary | ICD-10-CM

## 2020-03-22 DIAGNOSIS — E113591 Type 2 diabetes mellitus with proliferative diabetic retinopathy without macular edema, right eye: Secondary | ICD-10-CM

## 2020-03-22 NOTE — Assessment & Plan Note (Signed)
Now quiesced sent and involutional status post vitrectomy panretinal photocoagulation

## 2020-03-22 NOTE — Assessment & Plan Note (Signed)
Now clear 1 day postop vitrectomy, panretinal photocoagulation

## 2020-03-22 NOTE — Progress Notes (Signed)
03/22/2020     CHIEF COMPLAINT Patient presents for Post-op Follow-up   HISTORY OF PRESENT ILLNESS: Micheal [name] is a 64 y.o. male who presents to the clinic today for:   HPI    Post-op Follow-up    In right eye.  Discomfort includes Negative for pain.  Vision is stable.  I, the attending physician,  performed the HPI with the patient and updated documentation appropriately.          Comments    1 day PO OD - Vitrectomy &  PRP Endolaser Patient states that the patch was irritating until he found out he could take Tylenol. Pt states it was hard to go to sleep at first but he finally did.       Last edited by Hurman Horn, MD on 03/22/2020  8:29 AM. (History)      Referring physician: Maurice Small, MD [address],  [place] [address]  HISTORICAL INFORMATION:   Selected notes from the MEDICAL RECORD NUMBER    Lab Results  Component Value Date   HGBA1C 9.3 (A) 02/23/2020     CURRENT MEDICATIONS: Current Outpatient Medications (Ophthalmic Drugs)  Medication Sig  . ofloxacin (OCUFLOX) 0.3 % ophthalmic solution Place 1 drop into the right eye 4 (four) times daily.  . Prednisolon-Gatiflox-Bromfenac 1-0.5-0.075 % SUSP Place 1 drop into the right eye 4 (four) times daily.  . prednisoLONE acetate (PRED FORTE) 1 % ophthalmic suspension Place 1 drop into the right eye 4 (four) times daily.   No current facility-administered medications for this visit. (Ophthalmic Drugs)   Current Outpatient Medications (Other)  Medication Sig  . amLODipine (NORVASC) 10 MG tablet Take 10 mg by mouth daily.  [name] Kitchen aspirin 81 MG EC tablet Take by mouth.  [name] Kitchen atorvastatin (LIPITOR) 40 MG tablet Take 1 tablet (40 mg total) by mouth daily.  . calcitRIOL (ROCALTROL) 0.5 MCG capsule Take 1 capsule (0.5 mcg total) by mouth every Monday, Wednesday, and Friday with hemodialysis.  [name] Kitchen calcium acetate (PHOSLO) 667 MG capsule Take by mouth 3 (three) times daily with meals.  . Continuous  Blood Gluc Sensor (FREESTYLE LIBRE 14 DAY SENSOR) MISC USE FOUR TIMES DAILY AS DIRECTED  . fluticasone (FLONASE) 50 MCG/ACT nasal spray Place 2 sprays into both nostrils daily.  [name] Kitchen glucose blood (ONETOUCH VERIO) test strip Four times daily  . hydrALAZINE (APRESOLINE) 25 MG tablet Take 1 tablet (25 mg total) by mouth 3 (three) times daily.  . insulin glargine (LANTUS SOLOSTAR) 100 UNIT/ML Solostar Pen Inject 36 Units into the skin daily.  . insulin lispro (HUMALOG KWIKPEN) 200 UNIT/ML KwikPen Inject 16 Units into the skin with breakfast, with lunch, and with evening meal. Max daily 90 units to include correctional scale  . Insulin Pen Needle (B-D UF III MINI PEN NEEDLES) 31G X 5 MM MISC USE FOUR TIMES DAILY AS DIRECTED  . isosorbide dinitrate (ISORDIL) 30 MG tablet Take 1 tablet (30 mg total) by mouth 3 (three) times daily. Please call and schedule office visit for further refills 1st attempt  . metoprolol (TOPROL-XL) 200 MG 24 hr tablet Take 1 tablet (200 mg total) by mouth daily. Please call and schedule office visit for further refills 1st attempt  . nitroGLYCERIN (NITROSTAT) 0.4 MG SL tablet Place under the tongue.  [name] Kitchen omega-3 acid ethyl esters (LOVAZA) 1 G capsule Take 1 g by mouth daily.    [name] Kitchen omeprazole (PRILOSEC) 20 MG capsule Take 20 mg by mouth daily  as needed.   . sertraline (ZOLOFT) 50 MG tablet Take 0.5 tablets by mouth daily.    No current facility-administered medications for this visit. (Other)      REVIEW OF SYSTEMS:    ALLERGIES No Known Allergies  PAST MEDICAL HISTORY Past Medical History:  Diagnosis Date  . Acute respiratory failure with hypoxia ([place])   . Anemia   . CAP (community acquired pneumonia)   . Chronic diastolic heart failure ([place])   . Chronic kidney disease    he is s/p AVF but not on HD yet  . Diabetes mellitus   . Hypertension   . Obesity (BMI 30-39.9)   . OSA (obstructive sleep apnea)    severe with AHI 72/hr   Past Surgical History:  Procedure  Laterality Date  . AV FISTULA PLACEMENT    . CATARACT EXTRACTION Right 01/27/2020   Dr. Wyatt Portela  . PERCUTANEOUS CORONARY STENT INTERVENTION (PCI-S)  04/2017    FAMILY HISTORY Family History  Problem Relation Age of Onset  . Heart failure Father   . Heart disease Father   . Heart failure Other     SOCIAL HISTORY Social History   Tobacco Use  . Smoking status: Never Smoker  . Smokeless tobacco: Never Used  Substance Use Topics  . Alcohol use: No    Alcohol/week: 0.0 standard drinks  . Drug use: No         OPHTHALMIC EXAM:  Base Eye Exam    Visual Acuity (Snellen - Linear)      Right Left   Dist [place] 20/50-2 20/20-2   Dist ph [place] NI        Tonometry (Tonopen, 8:09 AM)      Right Left   Pressure 14 12       Neuro/Psych    Oriented x3: Yes   Mood/Affect: Normal       Dilation    Right eye: 1.0% Mydriacyl, 2.5% Phenylephrine @ 8:09 AM        Slit Lamp and Fundus Exam    External Exam      Right Left   External Normal Normal       Slit Lamp Exam      Right Left   Lids/Lashes Normal Normal   Conjunctiva/Sclera 1+ Subconjunctival hemorrhage White and quiet   Cornea Clear Clear   Anterior Chamber Deep and quiet Deep and quiet   Iris Round and reactive Round and reactive   Lens Posterior chamber intraocular lens    Anterior Vitreous Normal Normal       Fundus Exam      Right Left   Posterior Vitreous Vitrectomized    Disc old nvd, cauterized    C/D Ratio 0.5    Macula Normal    Vessels PDR-quiet    Periphery Good PRP 360           IMAGING AND PROCEDURES  Imaging and Procedures for 03/22/20           ASSESSMENT/PLAN:  Vitreous hemorrhage of right eye (HCC) Now clear 1 day postop vitrectomy, panretinal photocoagulation  Type 2 diabetes mellitus with proliferative retinopathy of right eye, with long-term current use of insulin ([place]) Now quiesced sent and involutional status post vitrectomy panretinal photocoagulation       ICD-10-CM   1. Follow-up examination after eye surgery  Z09   2. Vitreous hemorrhage of right eye ([place])  H43.11   3. Type 2 diabetes mellitus with right eye affected by proliferative retinopathy without  macular edema, with long-term current use of insulin ([place])  E11.3591    Z79.4     1.  OS with topical therapy OD,  Ofloxacin OD 4 times daily  Prednisolone acetate 1 drop OD 4 times daily  Patient instructed not to refill his medications and use them for maximum of 3 weeks.  2.  3.  Ophthalmic Meds Ordered this visit:  No orders of the defined types were placed in this encounter.      Return in about 1 week (around 03/29/2020) for POST OP, OD, COLOR FP.  There are no Patient Instructions on file for this visit.   Explained the diagnoses, plan, and follow up with the patient and they expressed understanding.  Patient expressed understanding of the importance of proper follow up care.   Clent Demark Tiona Ruane M.D. Diseases & Surgery of the Retina and Vitreous Retina & Diabetic [place] 03/22/20     Abbreviations: M myopia (nearsighted); A astigmatism; H hyperopia (farsighted); P presbyopia; Mrx spectacle prescription;  CTL contact lenses; OD right eye; OS left eye; OU both eyes  XT exotropia; ET esotropia; PEK punctate epithelial keratitis; PEE punctate epithelial erosions; DES dry eye syndrome; MGD meibomian gland dysfunction; ATs artificial tears; PFAT's preservative free artificial tears; [place] nuclear sclerotic cataract; PSC posterior subcapsular cataract; ERM epi-retinal membrane; PVD posterior vitreous detachment; RD retinal detachment; DM diabetes mellitus; DR diabetic retinopathy; NPDR non-proliferative diabetic retinopathy; PDR proliferative diabetic retinopathy; CSME clinically significant macular edema; DME diabetic macular edema; dbh dot blot hemorrhages; CWS cotton wool spot; POAG primary open angle glaucoma; C/D cup-to-disc ratio; HVF humphrey visual field; GVF goldmann  visual field; OCT optical coherence tomography; IOP intraocular pressure; BRVO Branch retinal vein occlusion; CRVO central retinal vein occlusion; CRAO central retinal artery occlusion; BRAO branch retinal artery occlusion; RT retinal tear; SB scleral buckle; PPV pars plana vitrectomy; VH Vitreous hemorrhage; PRP panretinal laser photocoagulation; IVK intravitreal kenalog; VMT vitreomacular traction; MH Macular hole;  NVD neovascularization of the disc; NVE neovascularization elsewhere; AREDS age related eye disease study; ARMD age related macular degeneration; POAG primary open angle glaucoma; EBMD epithelial/anterior basement membrane dystrophy; ACIOL anterior chamber intraocular lens; IOL intraocular lens; PCIOL posterior chamber intraocular lens; Phaco/IOL phacoemulsification with intraocular lens placement; [place] photorefractive keratectomy; LASIK laser assisted in situ keratomileusis; HTN hypertension; DM diabetes mellitus; COPD chronic obstructive pulmonary disease

## 2020-03-29 ENCOUNTER — Ambulatory Visit (INDEPENDENT_AMBULATORY_CARE_PROVIDER_SITE_OTHER): Payer: Medicare Other | Admitting: Ophthalmology

## 2020-03-29 ENCOUNTER — Other Ambulatory Visit: Payer: Self-pay

## 2020-03-29 ENCOUNTER — Encounter (INDEPENDENT_AMBULATORY_CARE_PROVIDER_SITE_OTHER): Payer: Self-pay | Admitting: Ophthalmology

## 2020-03-29 DIAGNOSIS — E113591 Type 2 diabetes mellitus with proliferative diabetic retinopathy without macular edema, right eye: Secondary | ICD-10-CM

## 2020-03-29 DIAGNOSIS — Z794 Long term (current) use of insulin: Secondary | ICD-10-CM

## 2020-03-29 NOTE — Progress Notes (Signed)
03/29/2020     CHIEF COMPLAINT Patient presents for Post-op Follow-up   HISTORY OF PRESENT ILLNESS: Micheal [name] is a 64 y.o. male who presents to the clinic today for:   HPI    Post-op Follow-up    In right eye.  Discomfort includes Negative for pain and itching.  Vision is stable.          Comments    1 week PO OD, FP OU- vitrectomy, panretinal photocoagulation Patient states that his eye has been doing well. No problems.  8 Day s\p vit laser OD        Last edited by Hurman Horn, MD on 03/29/2020 11:17 AM. (History)      Referring physician: Maurice Small, MD [place] [address],  [place] [address]  HISTORICAL INFORMATION:   Selected notes from the MEDICAL RECORD NUMBER    Lab Results  Component Value Date   HGBA1C 9.3 (A) 02/23/2020     CURRENT MEDICATIONS: Current Outpatient Medications (Ophthalmic Drugs)  Medication Sig  . ofloxacin (OCUFLOX) 0.3 % ophthalmic solution Place 1 drop into the right eye 4 (four) times daily.  . Prednisolon-Gatiflox-Bromfenac 1-0.5-0.075 % SUSP Place 1 drop into the right eye 4 (four) times daily.  . prednisoLONE acetate (PRED FORTE) 1 % ophthalmic suspension Place 1 drop into the right eye 4 (four) times daily.   No current facility-administered medications for this visit. (Ophthalmic Drugs)   Current Outpatient Medications (Other)  Medication Sig  . amLODipine (NORVASC) 10 MG tablet Take 10 mg by mouth daily.  [name] Kitchen aspirin 81 MG EC tablet Take by mouth.  [name] Kitchen atorvastatin (LIPITOR) 40 MG tablet Take 1 tablet (40 mg total) by mouth daily.  . calcitRIOL (ROCALTROL) 0.5 MCG capsule Take 1 capsule (0.5 mcg total) by mouth every Monday, Wednesday, and Friday with hemodialysis.  [name] Kitchen calcium acetate (PHOSLO) 667 MG capsule Take by mouth 3 (three) times daily with meals.  . Continuous Blood Gluc Sensor (FREESTYLE LIBRE 14 DAY SENSOR) MISC USE FOUR TIMES DAILY AS DIRECTED  . fluticasone (FLONASE) 50 MCG/ACT nasal spray  Place 2 sprays into both nostrils daily.  [name] Kitchen glucose blood (ONETOUCH VERIO) test strip Four times daily  . hydrALAZINE (APRESOLINE) 25 MG tablet Take 1 tablet (25 mg total) by mouth 3 (three) times daily.  . insulin glargine (LANTUS SOLOSTAR) 100 UNIT/ML Solostar Pen Inject 36 Units into the skin daily.  . insulin lispro (HUMALOG KWIKPEN) 200 UNIT/ML KwikPen Inject 16 Units into the skin with breakfast, with lunch, and with evening meal. Max daily 90 units to include correctional scale  . Insulin Pen Needle (B-D UF III MINI PEN NEEDLES) 31G X 5 MM MISC USE FOUR TIMES DAILY AS DIRECTED  . isosorbide dinitrate (ISORDIL) 30 MG tablet Take 1 tablet (30 mg total) by mouth 3 (three) times daily. Please call and schedule office visit for further refills 1st attempt  . metoprolol (TOPROL-XL) 200 MG 24 hr tablet Take 1 tablet (200 mg total) by mouth daily. Please call and schedule office visit for further refills 1st attempt  . nitroGLYCERIN (NITROSTAT) 0.4 MG SL tablet Place under the tongue.  [name] Kitchen omega-3 acid ethyl esters (LOVAZA) 1 G capsule Take 1 g by mouth daily.    [name] Kitchen omeprazole (PRILOSEC) 20 MG capsule Take 20 mg by mouth daily as needed.   . sertraline (ZOLOFT) 50 MG tablet Take 0.5 tablets by mouth daily.    No current facility-administered medications for this visit. (Other)  REVIEW OF SYSTEMS:    ALLERGIES No Known Allergies  PAST MEDICAL HISTORY Past Medical History:  Diagnosis Date  . Acute respiratory failure with hypoxia ([place])   . Anemia   . CAP (community acquired pneumonia)   . Chronic diastolic heart failure ([place])   . Chronic kidney disease    he is s/p AVF but not on HD yet  . Diabetes mellitus   . Hypertension   . Obesity (BMI 30-39.9)   . OSA (obstructive sleep apnea)    severe with AHI 72/hr   Past Surgical History:  Procedure Laterality Date  . AV FISTULA PLACEMENT    . CATARACT EXTRACTION Right 01/27/2020   Dr. Wyatt Portela  . PERCUTANEOUS CORONARY STENT  INTERVENTION (PCI-S)  04/2017    FAMILY HISTORY Family History  Problem Relation Age of Onset  . Heart failure Father   . Heart disease Father   . Heart failure Other     SOCIAL HISTORY Social History   Tobacco Use  . Smoking status: Never Smoker  . Smokeless tobacco: Never Used  Substance Use Topics  . Alcohol use: No    Alcohol/week: 0.0 standard drinks  . Drug use: No         OPHTHALMIC EXAM:  Base Eye Exam    Visual Acuity (Snellen - Linear)      Right Left   Dist [place] 20/30-2 20/25       Tonometry (Tonopen, 10:08 AM)      Right Left   Pressure 13 16       Neuro/Psych    Oriented x3: Yes   Mood/Affect: Normal       Dilation    Right eye: 1.0% Mydriacyl, 2.5% Phenylephrine @ 10:08 AM        Slit Lamp and Fundus Exam    External Exam      Right Left   External Normal Normal       Slit Lamp Exam      Right Left   Lids/Lashes Normal Normal   Conjunctiva/Sclera White and quiet White and quiet   Cornea Clear Clear   Anterior Chamber Deep and quiet Deep and quiet   Iris Round and reactive Round and reactive   Lens Posterior chamber intraocular lens 2+ Nuclear sclerosis   Anterior Vitreous Normal Normal       Fundus Exam      Right Left   Posterior Vitreous Vitrectomized, clear    Disc old nvd, cauterized    C/D Ratio 0.5    Macula Normal    Vessels PDR-quiet    Periphery Good PRP 360           IMAGING AND PROCEDURES  Imaging and Procedures for 03/29/20  Color Fundus Photography Optos - OU - Both Eyes       Right Eye Progression has improved. Disc findings include normal observations. Macula : normal observations.   Left Eye Progression has improved. Disc findings include normal observations. Macula : normal observations.   Notes Bilateral proliferative diabetic retinopathy, now quiesced sent.  OD status post vitrectomy and removal of neovascularization of the disc and elsewhere.  Clear media OU, stable                  ASSESSMENT/PLAN:  No problem-specific Assessment & Plan notes found for this encounter.      ICD-10-CM   1. 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  Patient is to complete current topical medications, Pred forte 1 drop 4 times daily right eye and ofloxacin 1 drop 4 times daily next 2 weeks.  Instructed patient do not refill these medications do not use beyond 2 weeks from today  2.  He looks great with quiescent proliferative diabetic retinopathy. 3.  Ophthalmic Meds Ordered this visit:  No orders of the defined types were placed in this encounter.      Return in about 8 weeks (around 05/24/2020) for DILATE OU, OCT.  There are no Patient Instructions on file for this visit.   Explained the diagnoses, plan, and follow up with the patient and they expressed understanding.  Patient expressed understanding of the importance of proper follow up care.   Clent Demark Yula Crotwell M.D. Diseases & Surgery of the Retina and Vitreous Retina & Diabetic [place] 03/29/20     Abbreviations: M myopia (nearsighted); A astigmatism; H hyperopia (farsighted); P presbyopia; Mrx spectacle prescription;  CTL contact lenses; OD right eye; OS left eye; OU both eyes  XT exotropia; ET esotropia; PEK punctate epithelial keratitis; PEE punctate epithelial erosions; DES dry eye syndrome; MGD meibomian gland dysfunction; ATs artificial tears; PFAT's preservative free artificial tears; [place] nuclear sclerotic cataract; PSC posterior subcapsular cataract; ERM epi-retinal membrane; PVD posterior vitreous detachment; RD retinal detachment; DM diabetes mellitus; DR diabetic retinopathy; NPDR non-proliferative diabetic retinopathy; PDR proliferative diabetic retinopathy; CSME clinically significant macular edema; DME diabetic macular edema; dbh dot  blot hemorrhages; CWS cotton wool spot; POAG primary open angle glaucoma; C/D cup-to-disc ratio; HVF humphrey visual field; GVF goldmann visual field; OCT optical coherence tomography; IOP intraocular pressure; BRVO Branch retinal vein occlusion; CRVO central retinal vein occlusion; CRAO central retinal artery occlusion; BRAO branch retinal artery occlusion; RT retinal tear; SB scleral buckle; PPV pars plana vitrectomy; VH Vitreous hemorrhage; PRP panretinal laser photocoagulation; IVK intravitreal kenalog; VMT vitreomacular traction; MH Macular hole;  NVD neovascularization of the disc; NVE neovascularization elsewhere; AREDS age related eye disease study; ARMD age related macular degeneration; POAG primary open angle glaucoma; EBMD epithelial/anterior basement membrane dystrophy; ACIOL anterior chamber intraocular lens; IOL intraocular lens; PCIOL posterior chamber intraocular lens; Phaco/IOL phacoemulsification with intraocular lens placement; [place] photorefractive keratectomy; LASIK laser assisted in situ keratomileusis; HTN hypertension; DM diabetes mellitus; COPD chronic obstructive pulmonary disease

## 2020-04-29 ENCOUNTER — Other Ambulatory Visit: Payer: Self-pay | Admitting: Internal Medicine

## 2020-05-02 ENCOUNTER — Telehealth: Payer: Self-pay | Admitting: Nutrition

## 2020-05-02 NOTE — Telephone Encounter (Signed)
Pt. Scheduled for pump start on 06/05/20

## 2020-05-24 ENCOUNTER — Encounter (INDEPENDENT_AMBULATORY_CARE_PROVIDER_SITE_OTHER): Payer: Medicare Other | Admitting: Ophthalmology

## 2020-05-31 ENCOUNTER — Encounter (INDEPENDENT_AMBULATORY_CARE_PROVIDER_SITE_OTHER): Payer: Self-pay | Admitting: Ophthalmology

## 2020-05-31 ENCOUNTER — Ambulatory Visit (INDEPENDENT_AMBULATORY_CARE_PROVIDER_SITE_OTHER): Payer: Medicare Other | Admitting: Ophthalmology

## 2020-05-31 ENCOUNTER — Other Ambulatory Visit: Payer: Self-pay

## 2020-05-31 DIAGNOSIS — E113592 Type 2 diabetes mellitus with proliferative diabetic retinopathy without macular edema, left eye: Secondary | ICD-10-CM

## 2020-05-31 DIAGNOSIS — G4733 Obstructive sleep apnea (adult) (pediatric): Secondary | ICD-10-CM

## 2020-05-31 DIAGNOSIS — E113591 Type 2 diabetes mellitus with proliferative diabetic retinopathy without macular edema, right eye: Secondary | ICD-10-CM

## 2020-05-31 NOTE — Assessment & Plan Note (Signed)
This condition OD is now quiesced sent and minor CSME deserves only observation at this point

## 2020-05-31 NOTE — Progress Notes (Signed)
05/31/2020     CHIEF COMPLAINT Patient presents for Retina Follow Up   HISTORY OF PRESENT ILLNESS: Micheal [name] is a 64 y.o. male who presents to the clinic today for:   HPI    Retina Follow Up    Patient presents with  Diabetic Retinopathy.  In right eye.  Severity is moderate.  Since onset it is stable.          Comments    9 Week Doabetic f\u OU. OCT  Pt states vision is stable. Denies any complaints. BGL: 169 A1C: 9.8       Last edited by Tilda Franco on 05/31/2020  9:29 AM. (History)      Referring physician: Maurice Small, MD [facility] [address],  [place] [address]  HISTORICAL INFORMATION:   Selected notes from the MEDICAL RECORD NUMBER    Lab Results  Component Value Date   HGBA1C 9.3 (A) 02/23/2020     CURRENT MEDICATIONS: Current Outpatient Medications (Ophthalmic Drugs)  Medication Sig  . ofloxacin (OCUFLOX) 0.3 % ophthalmic solution Place 1 drop into the right eye 4 (four) times daily.  . Prednisolon-Gatiflox-Bromfenac 1-0.5-0.075 % SUSP Place 1 drop into the right eye 4 (four) times daily.  . prednisoLONE acetate (PRED FORTE) 1 % ophthalmic suspension Place 1 drop into the right eye 4 (four) times daily.   No current facility-administered medications for this visit. (Ophthalmic Drugs)   Current Outpatient Medications (Other)  Medication Sig  . amLODipine (NORVASC) 10 MG tablet Take 10 mg by mouth daily.  [name] Kitchen aspirin 81 MG EC tablet Take by mouth.  [name] Kitchen atorvastatin (LIPITOR) 40 MG tablet Take 1 tablet (40 mg total) by mouth daily.  . calcitRIOL (ROCALTROL) 0.5 MCG capsule Take 1 capsule (0.5 mcg total) by mouth every Monday, Wednesday, and Friday with hemodialysis.  [name] Kitchen calcium acetate (PHOSLO) 667 MG capsule Take by mouth 3 (three) times daily with meals.  . Continuous Blood Gluc Sensor (FREESTYLE LIBRE 14 DAY SENSOR) MISC USE TO CHECK BLOOD SUGAR  . fluticasone (FLONASE) 50 MCG/ACT nasal spray Place 2 sprays into both nostrils  daily.  [name] Kitchen glucose blood (ONETOUCH VERIO) test strip Four times daily  . hydrALAZINE (APRESOLINE) 25 MG tablet Take 1 tablet (25 mg total) by mouth 3 (three) times daily.  . insulin glargine (LANTUS SOLOSTAR) 100 UNIT/ML Solostar Pen Inject 36 Units into the skin daily.  . insulin lispro (HUMALOG KWIKPEN) 200 UNIT/ML KwikPen Inject 16 Units into the skin with breakfast, with lunch, and with evening meal. Max daily 90 units to include correctional scale  . Insulin Pen Needle (B-D UF III MINI PEN NEEDLES) 31G X 5 MM MISC USE FOUR TIMES DAILY AS DIRECTED  . isosorbide dinitrate (ISORDIL) 30 MG tablet Take 1 tablet (30 mg total) by mouth 3 (three) times daily. Please call and schedule office visit for further refills 1st attempt  . metoprolol (TOPROL-XL) 200 MG 24 hr tablet Take 1 tablet (200 mg total) by mouth daily. Please call and schedule office visit for further refills 1st attempt  . nitroGLYCERIN (NITROSTAT) 0.4 MG SL tablet Place under the tongue.  [name] Kitchen omega-3 acid ethyl esters (LOVAZA) 1 G capsule Take 1 g by mouth daily.    [name] Kitchen omeprazole (PRILOSEC) 20 MG capsule Take 20 mg by mouth daily as needed.   . sertraline (ZOLOFT) 50 MG tablet Take 0.5 tablets by mouth daily.    No current facility-administered medications for this visit. (Other)  REVIEW OF SYSTEMS: ROS    Positive for: Endocrine   Last edited by Tilda Franco on 05/31/2020  9:28 AM. (History)       ALLERGIES No Known Allergies  PAST MEDICAL HISTORY Past Medical History:  Diagnosis Date  . Acute respiratory failure with hypoxia ([place])   . Anemia   . CAP (community acquired pneumonia)   . Chronic diastolic heart failure ([place])   . Chronic kidney disease    he is s/p AVF but not on HD yet  . Diabetes mellitus   . Hypertension   . Obesity (BMI 30-39.9)   . OSA (obstructive sleep apnea)    severe with AHI 72/hr   Past Surgical History:  Procedure Laterality Date  . AV FISTULA PLACEMENT    . CATARACT  EXTRACTION Right 01/27/2020   Dr. Wyatt Portela  . PERCUTANEOUS CORONARY STENT INTERVENTION (PCI-S)  04/2017    FAMILY HISTORY Family History  Problem Relation Age of Onset  . Heart failure Father   . Heart disease Father   . Heart failure Other     SOCIAL HISTORY Social History   Tobacco Use  . Smoking status: Never Smoker  . Smokeless tobacco: Never Used  Substance Use Topics  . Alcohol use: No    Alcohol/week: 0.0 standard drinks  . Drug use: No         OPHTHALMIC EXAM: Base Eye Exam    Visual Acuity (Snellen - Linear)      Right Left   Dist [place] 20/50 -1 20/25   Dist ph [place] 20/30        Tonometry (Tonopen, 9:33 AM)      Right Left   Pressure 15 16       Pupils      Pupils Dark Light Shape React APD   Right PERRL 3 3 Round Minimal None   Left PERRL 3 3 Round Minimal None       Visual Fields (Counting fingers)      Left Right    Full Full       Neuro/Psych    Oriented x3: Yes   Mood/Affect: Normal       Dilation    Both eyes: 1.0% Mydriacyl, 2.5% Phenylephrine @ 9:33 AM        Slit Lamp and Fundus Exam    External Exam      Right Left   External Normal Normal       Slit Lamp Exam      Right Left   Lids/Lashes Normal Normal   Conjunctiva/Sclera White and quiet White and quiet   Cornea Clear Clear   Anterior Chamber Deep and quiet Deep and quiet   Iris Round and reactive Round and reactive   Lens Posterior chamber intraocular lens 2+ Nuclear sclerosis   Anterior Vitreous Normal Normal       Fundus Exam      Right Left   Posterior Vitreous Vitrectomized, clear Normal   Disc old nvd, cauterized Normal   C/D Ratio 0.5 0.3   Macula Normal Normal   Vessels PDR-quiet Retinopathy,, proliferative diabetic retinopathy active   Periphery Good PRP 360 Nasal PRP          IMAGING AND PROCEDURES  Imaging and Procedures for 05/31/20  OCT, Retina - OU - Both Eyes       Right Eye Quality was good. Scan locations included subfoveal. Central  Foveal Thickness: 335. Findings include normal observations, abnormal foveal contour.   Left Eye  Quality was good. Scan locations included subfoveal. Central Foveal Thickness: 269. Findings include normal observations, vitreomacular adhesion .   Notes Mild csme superiorly OD                ASSESSMENT/PLAN:  OSA (obstructive sleep apnea) Patient reports noncompliance with CPAP, I have encouraged him to use CPAP use to improve the oxygen to his brain for sense of wellbeing but most importantly to his eyes to protect from diabetic eye disease progression.  I explained him that low oxygen drops during the night with untreated sleep apnea with his severe apnea document in the past, can lead to stroke heart attack but also to progression of his diabetic eye disease particularly in the left eye which is not as is as bad as the right in the past.  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      ICD-10-CM   1. 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 OSA (obstructive sleep apnea)  G47.33   3. Type 2 diabetes mellitus with left eye affected by proliferative retinopathy without macular edema, without long-term current use of insulin ([place])  C62.3762      1.  Proliferative diabetic retinopathy in each eye is now well controlled.  No active disease 2.  Currently patient is not compliant with treatment of obstructive sleep apnea, with an AHI index of 72 in the past.  This severe sleep apnea causes problems with progression of diabetic  retinopathy, maculopathy, optic neuropathy, in addition to risk for stroke and heart attack.  These conditions were reviewed at length with patient and I encouraged forth with to repeat evaluation with his medical doctor and use what ever it takes to treat his sleep apnea should it be persisting.  I informed him that his side effects of treatment will likely be improved sense of wellbeing improved diabetic blood sugar control prevention of stroke and heart attack and likely less nocturia, and less somnolence during the day  3.  Cataract progression left eye is likely to occur in the near future, currently without symptoms  Ophthalmic Meds Ordered this visit:  No orders of the defined types were placed in this encounter.      Return in about 4 months (around 09/30/2020) for DILATE OU, OCT.  There are no Patient Instructions on file for this visit.   Explained the diagnoses, plan, and follow up with the patient and they expressed understanding.  Patient expressed understanding of the importance of proper follow up care.   Clent Demark Tiegan Terpstra M.D. Diseases & Surgery of the Retina and Vitreous Retina & Diabetic [facility] 05/31/20     Abbreviations: M myopia (nearsighted); A astigmatism; H hyperopia (farsighted); P presbyopia; Mrx spectacle prescription;  CTL contact lenses; OD right eye; OS left eye; OU both eyes  XT exotropia; ET esotropia; PEK punctate epithelial keratitis; PEE punctate epithelial erosions; DES dry eye syndrome; MGD meibomian gland dysfunction; ATs artificial tears; PFAT's preservative free artificial tears; [place] nuclear sclerotic cataract; PSC posterior subcapsular cataract; ERM epi-retinal membrane; PVD posterior  vitreous detachment; RD retinal detachment; DM diabetes mellitus; DR diabetic retinopathy; NPDR non-proliferative diabetic retinopathy; PDR proliferative diabetic retinopathy; CSME clinically significant macular edema; DME diabetic macular edema; dbh dot blot hemorrhages;  CWS cotton wool spot; POAG primary open angle glaucoma; C/D cup-to-disc ratio; HVF humphrey visual field; GVF goldmann visual field; OCT optical coherence tomography; IOP intraocular pressure; BRVO Branch retinal vein occlusion; CRVO central retinal vein occlusion; CRAO central retinal artery occlusion; BRAO branch retinal artery occlusion; RT retinal tear; SB scleral buckle; PPV pars plana vitrectomy; VH Vitreous hemorrhage; PRP panretinal laser photocoagulation; IVK intravitreal kenalog; VMT vitreomacular traction; MH Macular hole;  NVD neovascularization of the disc; NVE neovascularization elsewhere; AREDS age related eye disease study; ARMD age related macular degeneration; POAG primary open angle glaucoma; EBMD epithelial/anterior basement membrane dystrophy; ACIOL anterior chamber intraocular lens; IOL intraocular lens; PCIOL posterior chamber intraocular lens; Phaco/IOL phacoemulsification with intraocular lens placement; [place] photorefractive keratectomy; LASIK laser assisted in situ keratomileusis; HTN hypertension; DM diabetes mellitus; COPD chronic obstructive pulmonary disease

## 2020-05-31 NOTE — Assessment & Plan Note (Signed)
Patient reports noncompliance with CPAP, I have encouraged him to use CPAP use to improve the oxygen to his brain for sense of wellbeing but most importantly to his eyes to protect from diabetic eye disease progression.  I explained him that low oxygen drops during the night with untreated sleep apnea with his severe apnea document in the past, can lead to stroke heart attack but also to progression of his diabetic eye disease particularly in the left eye which is not as is as bad as the right in the past.

## 2020-05-31 NOTE — Assessment & Plan Note (Signed)
Good PRP in the past OS, will continue to monitor.

## 2020-06-05 ENCOUNTER — Other Ambulatory Visit: Payer: Self-pay

## 2020-06-05 ENCOUNTER — Encounter: Payer: Medicare Other | Attending: Internal Medicine | Admitting: Nutrition

## 2020-06-05 DIAGNOSIS — N186 End stage renal disease: Secondary | ICD-10-CM | POA: Insufficient documentation

## 2020-06-05 DIAGNOSIS — E1165 Type 2 diabetes mellitus with hyperglycemia: Secondary | ICD-10-CM | POA: Insufficient documentation

## 2020-06-05 DIAGNOSIS — E113591 Type 2 diabetes mellitus with proliferative diabetic retinopathy without macular edema, right eye: Secondary | ICD-10-CM

## 2020-06-05 DIAGNOSIS — Z992 Dependence on renal dialysis: Secondary | ICD-10-CM | POA: Insufficient documentation

## 2020-06-05 DIAGNOSIS — Z794 Long term (current) use of insulin: Secondary | ICD-10-CM | POA: Insufficient documentation

## 2020-06-05 DIAGNOSIS — E1122 Type 2 diabetes mellitus with diabetic chronic kidney disease: Secondary | ICD-10-CM | POA: Insufficient documentation

## 2020-06-05 NOTE — Progress Notes (Signed)
Patient was identified by name and DOB.  He is here today to start his Dash insulin pump.  He did not have his PDM, just the pods, and said he does not know if it is at home.   We did some pump training:  We discussed how this pump delivers insulin, and the fact that he does not need the long acting insulin when he starts this.  He reported good understanding of this. He does not count carbs, and said he does not really want to do this.  We discussed his diet:  Bfast:  Chicken biscuit, or proten shake, or egg mcMuffic acB.  He was shown how these 3 meals all have varing abounts of carbohydrate and fat, which requires more/less insulin for good coverage.  He reports that he is willing to learn, but not while learning the pump.. Diet history:  Meals vary for days on dialysis Dialysis days: 11:30 Bfast:  As above, 2PM: meat 4 ounces, with 2 non starchy veg., and unsweat tea, or diet drink 5PM: Nabs a few times/wk 7PM: Supper:  4-6 ounces 2-3 non starchy veg. With no bread 11PM: snack-this varies: cookies, vanilla waffers, candy nabs, fruit cup. Non Dialysis days:  9:30:  Handful of fish crackers. 11:30.  Can be a breakfast as above or nabs and unsweat tea 4-5PM: snack of nabs, or fruit,  7PM-MN: as above. Discussed importance of balanced meal and what that means for hunger relief between meals, and reduced low blood sugars. Pump training:  IOB, need for no more long acting insulin and "basal bolus" insulin delivery He will go home to see if his PDM is there.  If it is, he will call me back to reschedule pump start for Thursday.

## 2020-06-07 ENCOUNTER — Encounter: Payer: Medicare Other | Admitting: Nutrition

## 2020-06-07 ENCOUNTER — Telehealth: Payer: Self-pay | Admitting: Nutrition

## 2020-06-07 ENCOUNTER — Other Ambulatory Visit: Payer: Self-pay

## 2020-06-07 DIAGNOSIS — Z794 Long term (current) use of insulin: Secondary | ICD-10-CM | POA: Diagnosis present

## 2020-06-07 DIAGNOSIS — E1165 Type 2 diabetes mellitus with hyperglycemia: Secondary | ICD-10-CM | POA: Diagnosis present

## 2020-06-07 DIAGNOSIS — Z992 Dependence on renal dialysis: Secondary | ICD-10-CM | POA: Diagnosis present

## 2020-06-07 DIAGNOSIS — N186 End stage renal disease: Secondary | ICD-10-CM | POA: Diagnosis present

## 2020-06-07 DIAGNOSIS — IMO0002 Reserved for concepts with insufficient information to code with codable children: Secondary | ICD-10-CM

## 2020-06-07 DIAGNOSIS — E1122 Type 2 diabetes mellitus with diabetic chronic kidney disease: Secondary | ICD-10-CM | POA: Diagnosis present

## 2020-06-07 DIAGNOSIS — E113591 Type 2 diabetes mellitus with proliferative diabetic retinopathy without macular edema, right eye: Secondary | ICD-10-CM | POA: Diagnosis not present

## 2020-06-07 NOTE — Telephone Encounter (Signed)
Micheal [name] reported no difficulty giving lunch bolus, but reports that blood sugar is still 224, 2hr. PcL.  Told him that he still has IOB and that his blood sugar should come down by supper.  He requested that I still call him this evening, and I agreed to do this after supper. He had no questions for me at this time.

## 2020-06-07 NOTE — Patient Instructions (Addendum)
Read over pump resource manual and pump manual  Call pump [phone] number if questions about his pump.

## 2020-06-07 NOTE — Progress Notes (Signed)
Micheal [name] was trained on the use of the Dash insulin pump.  We again discussed how this pump delivers insulin and he reports that he had not taken his Lantus (36u) last night.  Settings were put into pump by patient, per Dr. Quin Hoop orders.  Basal rate: .55u/hr. I/C ratio: 1 (patient will put in units of insulin in carb portions of bolus calculator. ISF: 50  Target 100 with correction over 140.    He is using U-200 Humalog insulin.  Written instructions given for 8u (in stead of his normal 16u of Humalog U-200 for his meal time insulin dose.  He re demonstrated how to give a bolus X2 and reported good understanding of this.  We also discussed IOB-what this is, and how the pump uses this  Information when calculation a meal time insulin dose.  He said he understood this, but agreed to read about this in his manual today.   He filled a pod with Humalog U-200 insulin and attached it to his right lower abdomen.  His pump was started at 11:00AM.  His blood sugar was 224.  No correction dose was given at this time.   We also reviewed low blood sugars-symptoms and treatments.  Glucose tablets given for this with directions to take 4 tablets for a 60 point blood sugar rise. He is wearing a LIbre sensor and we discussed the difference between blood sugar and sensor readings and the need to verify readings with a glucose reading when in doubt, or if arrows are pointing up or down.  He reported good understanding of this. We discussed how/when to give a correction dose, and how to put the pump in stop/run.  He reported that he felt as if he had learned a lot today, so we reviewed how to respond to alerts and alarms and an appointment was made to finish training next week. I will call him later today and tomorrow to see how he is doing.

## 2020-06-08 ENCOUNTER — Telehealth: Payer: Self-pay | Admitting: Nutrition

## 2020-06-08 NOTE — Telephone Encounter (Signed)
Patient reports that his blood sugar before supper last night 135 at 8:30PM.  He ate a snack without a bolus of 20 grams of carb and blood sugar at HS was 201.  FBS today was  179 at 4AM.  He ate nothing,  went to dialysis and came home at 11:30 AM and ate lunch. His blood sugar acL was 149.  He has eaten nothing since lunch, and his blood sugar now is 135 at 4:30PM.   He was told to bolus before his HS snack of 1u.  He agreed to do this.

## 2020-06-08 NOTE — Telephone Encounter (Signed)
Patient reported no difficulty giving bolus for lunch and supper.  His blood sugar came down to 135 acS last night, and he took his 8u as directed.  Blood sugar was now 203 1.5 hr. pcS with 6u of IOB.  He will be awake for the next 3 hours.  He was told to have a snack now and to monitor his blood sugar, because his 6u of IOB will continue to drop his blood sugar 50 points/unit.  He reported good understanding of this, and agreed to have his normal 15-25 gram carb snack of fruit or chips as usual, and eat more as needed.

## 2020-06-08 NOTE — Telephone Encounter (Signed)
Message left on machine to call me concerning his blood sugar readings and pump usage.

## 2020-06-09 NOTE — Patient Instructions (Signed)
Put battery in PDM and charge before coming to pump start Call to let me know if PDM is at your house.  Can Schedule for Thursday of this week.

## 2020-06-13 ENCOUNTER — Encounter: Payer: Medicare Other | Admitting: Nutrition

## 2020-06-13 ENCOUNTER — Telehealth: Payer: Self-pay | Admitting: Nutrition

## 2020-06-13 DIAGNOSIS — E1122 Type 2 diabetes mellitus with diabetic chronic kidney disease: Secondary | ICD-10-CM

## 2020-06-13 NOTE — Telephone Encounter (Signed)
Patient is here for his final pump training.  Blood sugars are in [facility] and linked to [facility], if you want to see them .  His average readings: MN-3AM: 192 3AM-6AM: 210 6AM-9AM-202 9AM-12PM-190 12PM-3PM-227 3PM-6PM:215 6PM-9PM: 207 9PM-12PM: 177 His basal rate is 0.8m, meal boluses: 8/10/8.   He is on U-200 Humalog.

## 2020-06-14 NOTE — Progress Notes (Signed)
Patient is here finish his pump training.  He reports no problems wearing/using the OmniPod, giving boluses, changing pods, but reports that his blood sugars are in the 200s all the time.   We reviewed the final topics on the checklist: How to change pump settings, High blood sugar protocol, sick days, temp basal rates/when and how to use these, alerts/alarms and emergency supplies to carry with him. Handouts were given on all of the above topics. He reported good understanding of this, and signed the checklist as understanding all topics.   Blood sugars: Libre readings: for last 14 day average : MN-3AM: 192 3AM-6AM: 210 6AM-9AM-202 9AM-12PM-190 12PM-3PM-227 3PM-6PM:215 6PM-9PM: 207 9PM-12PM: 177

## 2020-06-14 NOTE — Patient Instructions (Signed)
Read over handouts given and call if questions Read over Resource manual and pump manuel, and call help line if questions Call for appointment with Dr. Kelton Pillar in 2 weeks.

## 2020-06-14 NOTE — Telephone Encounter (Signed)
Message left on voice mail to increase his basal rate and boluses per Dr. Quin Hoop orders below:  Basal rate: 0.61 (can not do 0.605), and boluses increased to 10u before breakfast, lunch and supper

## 2020-06-28 ENCOUNTER — Encounter: Payer: Self-pay | Admitting: Internal Medicine

## 2020-06-28 ENCOUNTER — Ambulatory Visit (INDEPENDENT_AMBULATORY_CARE_PROVIDER_SITE_OTHER): Payer: Medicare Other | Admitting: Internal Medicine

## 2020-06-28 ENCOUNTER — Other Ambulatory Visit: Payer: Self-pay

## 2020-06-28 VITALS — BP 136/74 | HR 74 | Ht 68.0 in | Wt 238.0 lb

## 2020-06-28 DIAGNOSIS — E1122 Type 2 diabetes mellitus with diabetic chronic kidney disease: Secondary | ICD-10-CM

## 2020-06-28 DIAGNOSIS — E113591 Type 2 diabetes mellitus with proliferative diabetic retinopathy without macular edema, right eye: Secondary | ICD-10-CM

## 2020-06-28 DIAGNOSIS — Z794 Long term (current) use of insulin: Secondary | ICD-10-CM | POA: Diagnosis not present

## 2020-06-28 DIAGNOSIS — Z992 Dependence on renal dialysis: Secondary | ICD-10-CM

## 2020-06-28 DIAGNOSIS — E1165 Type 2 diabetes mellitus with hyperglycemia: Secondary | ICD-10-CM | POA: Diagnosis not present

## 2020-06-28 DIAGNOSIS — E1142 Type 2 diabetes mellitus with diabetic polyneuropathy: Secondary | ICD-10-CM | POA: Diagnosis not present

## 2020-06-28 DIAGNOSIS — N186 End stage renal disease: Secondary | ICD-10-CM

## 2020-06-28 LAB — POCT GLYCOSYLATED HEMOGLOBIN (HGB A1C): Hemoglobin A1C: 8.6 % — AB (ref 4.0–5.6)

## 2020-06-28 MED ORDER — TRANSPARENT I.V. SITE DRESSING MISC: 1.0000 | 1 refills | Status: AC

## 2020-06-28 NOTE — Progress Notes (Signed)
Name: Micheal [name]  Age/ Sex: 64 y.o., male   MRN/ DOB: [id number], Dec 01, 1955     PCP: Maurice Small, MD   Reason for Endocrinology Evaluation: Type 2 Diabetes Mellitus  Initial Endocrine Consultative Visit: 09/28/2018    PATIENT IDENTIFIER: [name] is a 64 y.o. male with a past medical history of DM, ESRD on HD (started on 03/2016), HTN, OSA. The patient has followed with Endocrinology clinic since 09/28/2018 for consultative assistance with management of his diabetes.  DIABETIC HISTORY:  [name] was diagnosed with T2DM 1992,has tried Metformin and  Glipizide in the past. Started insulin therapy years ago. His hemoglobin A1c has ranged from 6.7% in 2017, peaking at 12.3 %in 07/2018 .  He was started on the Omnipod in 05/2020  SUBJECTIVE:   During the last visit (10/27/2019): A1c 9.3 %. We adjusted his MDI regimen    Today (06/28/2020): Mr. [name] is here for a follow up on diabetes .  He checks his blood sugars 3 times daily through  freestyle libre. The patient has not had hypoglycemic episodes since the last clinic visit.     Denies nausea, diarrhea   HOME DIABETES REGIMEN:   Humalog 200/mL   PUMP SETTINGS & DOWNLOAD   BASAL SETTINGS: Time  Basal Rate (units/ hr)   0000 0.90  Total 24-hour basal: 19.1  BOLUS SETTINGS: Insulin:Carb Ratio: 1 Sensitivity: 50 Target BG: 100-130 Active Insulin Time: 4 hours   CURRENT PUMP STATISTICS: Average BG: 241  BG Readings: 39 Average Daily Carbs (g): 16 Average Total Daily Insulin: 36.6 Average Daily Basal: 19.1 (52 %) Average Daily Bolus: 17.5 (48 %)       CONTINUOUS GLUCOSE MONITORING RECORD INTERPRETATION    Dates of Recording:9/10-10/03/2020  Sensor description: freestyle libre   Results statistics:   CGM use % of time 63  Average and SD 209/30.5  Time in range  37  %  % Time Above 180 39  % Time above 250 24  % Time Below target 0    Glycemic patterns summary:  Hyperglycemia  through the day   Hypoglycemic episodes occurred none Overnight periods: within goal but variable     DIABETIC COMPLICATIONS: Microvascular complications:   CKD , retinopathy on the right per pt S/P laser, neuropathy based on exam    Last eye exam: Completed 02/02/2020  Macrovascular complications:   CHF   Denies: PVD, CVA  HISTORY:  Past Medical History:  Past Medical History:  Diagnosis Date  . Acute respiratory failure with hypoxia ([place])   . Anemia   . CAP (community acquired pneumonia)   . Chronic diastolic heart failure ([place])   . Chronic kidney disease    he is s/p AVF but not on HD yet  . Diabetes mellitus   . Hypertension   . Obesity (BMI 30-39.9)   . OSA (obstructive sleep apnea)    severe with AHI 72/hr   Past Surgical History:  Past Surgical History:  Procedure Laterality Date  . AV FISTULA PLACEMENT    . CATARACT EXTRACTION Right 01/27/2020   Dr. Wyatt Portela  . PERCUTANEOUS CORONARY STENT INTERVENTION (PCI-S)  04/2017    Social History:  reports that he has never smoked. He has never used smokeless tobacco. He reports that he does not drink alcohol and does not use drugs. Family History:  Family History  Problem Relation Age of Onset  . Heart failure Father   . Heart disease Father   . Heart failure  Other      HOME MEDICATIONS: Allergies as of 06/28/2020   No Known Allergies     Medication List       Accurate as of June 28, 2020  7:10 AM. If you have any questions, ask your nurse or doctor.        amLODipine 10 MG tablet Commonly known as: NORVASC Take 10 mg by mouth daily.   aspirin 81 MG EC tablet Take by mouth.   atorvastatin 40 MG tablet Commonly known as: LIPITOR Take 1 tablet (40 mg total) by mouth daily.   B-D UF III MINI PEN NEEDLES 31G X 5 MM Misc Generic drug: Insulin Pen Needle USE FOUR TIMES DAILY AS DIRECTED   calcitRIOL 0.5 MCG capsule Commonly known as: ROCALTROL Take 1 capsule (0.5 mcg total) by mouth  every Monday, Wednesday, and Friday with hemodialysis.   calcium acetate 667 MG capsule Commonly known as: PHOSLO Take by mouth 3 (three) times daily with meals.   fluticasone 50 MCG/ACT nasal spray Commonly known as: FLONASE Place 2 sprays into both nostrils daily.   FreeStyle Libre 14 Day Sensor Misc USE TO CHECK BLOOD SUGAR   glucose blood test strip Commonly known as: Civil engineer, contracting Four times daily   HumaLOG KwikPen 200 UNIT/ML KwikPen Generic drug: insulin lispro Inject 16 Units into the skin with breakfast, with lunch, and with evening meal. Max daily 90 units to include correctional scale   hydrALAZINE 25 MG tablet Commonly known as: APRESOLINE Take 1 tablet (25 mg total) by mouth 3 (three) times daily.   isosorbide dinitrate 30 MG tablet Commonly known as: ISORDIL Take 1 tablet (30 mg total) by mouth 3 (three) times daily. Please call and schedule office visit for further refills 1st attempt   Lantus SoloStar 100 UNIT/ML Solostar Pen Generic drug: insulin glargine Inject 36 Units into the skin daily.   metoprolol 200 MG 24 hr tablet Commonly known as: TOPROL-XL Take 1 tablet (200 mg total) by mouth daily. Please call and schedule office visit for further refills 1st attempt   nitroGLYCERIN 0.4 MG SL tablet Commonly known as: NITROSTAT Place under the tongue.   ofloxacin 0.3 % ophthalmic solution Commonly known as: OCUFLOX Place 1 drop into the right eye 4 (four) times daily.   omega-3 acid ethyl esters 1 g capsule Commonly known as: LOVAZA Take 1 g by mouth daily.   omeprazole 20 MG capsule Commonly known as: PRILOSEC Take 20 mg by mouth daily as needed.   Prednisolon-Gatiflox-Bromfenac 1-0.5-0.075 % Susp Place 1 drop into the right eye 4 (four) times daily.   prednisoLONE acetate 1 % ophthalmic suspension Commonly known as: PRED FORTE Place 1 drop into the right eye 4 (four) times daily.   sertraline 50 MG tablet Commonly known as: ZOLOFT Take  0.5 tablets by mouth daily.       OBJECTIVE:   Vital Signs: There were no vitals taken for this visit.   General: Pt appears well and is in NAD  Lungs: Clear with good BS bilat with no rales, rhonchi, or wheezes  Heart: RRR with normal S1 and S2 and no gallops; no murmurs; no rub  Abdomen: soft, nontender, RLQ lipohypertrophic area  Extremities: No pretibial edema.  Neuro: MS is good with appropriate affect, pt is alert and Ox3      DM foot exam:10/27/2019 The skin of the feet is intact without sores or ulcerations. The pedal pulses are 2+ on right and 2+ on left. The sensation is  decreased to a screening 5.07, 10 gram monofilament on the left       DATA REVIEWED:    10/26/2019 9.11 (H) 0.50 - 1.30 mg/dL INFIAN TIME   Potassium, Bld 4.9 (H) 3.6 - 5.1 mmol/L   Chloride 101 (H) 97 - 110 mmol/L   CO2 Content 22 (H) 22 - 32 mmol/L   Calcium 9.5 (H) 8.9 - 10.3 mg/dL   Phosphorus 6.0 (H) 2.5 - 5.5 mg/dL   CA X PHOS 56.9 (H) 15.0 - 75.0   Alkaline Phosphatase 57 (H) 38 - 195 IU/L   SGOT (AST) 14 (H) 12 - 50 IU/L   LDH 185 (H) 140 - 271 IU/L   PROTEIN  7.5 (H) 6.2 - 8.3 g/dL   ALBUMIN 3.8 (H) 3.5 - 5.2 g/dL   Albumin/Globulin Ratio 1.0 (L) 1.1 - 1.9 Ratio   Sodium 139 (L) 136 - 144 mmol/L   ALT 16 (L) 17 - 63 IU/L      ASSESSMENT / PLAN / RECOMMENDATIONS:   1) Type 2 Diabetes Mellitus, Poorly controlled, With Renal complications (ESRD on HD), neuropathic and retinal complication - Most recent A1c of 8.6 %. Goal A1c < 7.0 %.    - He still trying to learn the pump, he has a referral for carb counting.  - His Aq1c is improving.   - I have tried to prescribe the vial in 200u/ml but it doesn't seem they are all 100 u/ml   - I will check with Vaughan Basta when she is back on this   -I am going to make the following pump adjustment.  - I have advised him to continue to enter 8 grams for snacks but increase to 12 grams for meals     MEDICATIONS: Humalog 200  u/mL   BASAL SETTINGS: Time  Basal Rate (units/ hr)   0000 0.95    BOLUS SETTINGS: Insulin:Carb Ratio: 1 Sensitivity: 35 Target BG: 100-130 Active Insulin Time: 4 hours      EDUCATION / INSTRUCTIONS:  BG monitoring instructions: Patient is instructed to check his blood sugars 4 times a day, before each meal and bedtime.  Call [facility] if: BG persistently < 70 . I reviewed the Rule of 15 for the treatment of hypoglycemia in detail with the patient. Literature supplied.    2) Diabetic complications:   Eye: Does have known diabetic retinopathy.   Neuro/ Feet: Does have known diabetic peripheral neuropathy based on foot exam on 10/27/2019  Renal: Patient does  have known ESRD, patient is on HD   F/U in 4 months    Signed electronically by: Mack Guise, MD  [facility]  [address] [place], [place] [address] Phone: [phone] FAX: [phone]   CC: Maurice Small, MD [place] [address] [address] Phone: [phone]  Fax: [phone]  Return to Endocrinology clinic as below: Future Appointments  Date Time Provider [place]  06/28/2020 11:10 AM Sharod Petsch, Melanie Crazier, MD [facility] None  10/01/2020 10:45 AM Rankin, Clent Demark, MD [facility] None

## 2020-06-28 NOTE — Patient Instructions (Addendum)
Continue to enter 8 grams for a snack but increase to 12 grams for meals      HOW TO TREAT LOW BLOOD SUGARS (Blood sugar LESS THAN 70 MG/DL)  Please follow the RULE OF 15 for the treatment of hypoglycemia treatment (when your (blood sugars are less than 70 mg/dL)    STEP 1: Take 15 grams of carbohydrates when your blood sugar is low, which includes:   3-4 GLUCOSE TABS  OR  3-4 OZ OF JUICE OR REGULAR SODA OR  ONE TUBE OF GLUCOSE GEL     STEP 2: RECHECK blood sugar in 15 MINUTES STEP 3: If your blood sugar is still low at the 15 minute recheck --> then, go back to STEP 1 and treat AGAIN with another 15 grams of carbohydrates. He is

## 2020-09-18 ENCOUNTER — Other Ambulatory Visit (HOSPITAL_BASED_OUTPATIENT_CLINIC_OR_DEPARTMENT_OTHER): Payer: Self-pay | Admitting: Internal Medicine

## 2020-09-18 ENCOUNTER — Ambulatory Visit: Payer: 59 | Attending: Internal Medicine

## 2020-09-18 DIAGNOSIS — Z23 Encounter for immunization: Secondary | ICD-10-CM

## 2020-09-18 NOTE — Progress Notes (Signed)
   Covid-19 Vaccination Clinic  Name:  Micheal [name]    MRN: [id number] DOB: 11-28-1955  09/18/2020  [name] was observed post Covid-19 immunization for 15 minutes without incident. He was provided with Vaccine Information Sheet and instruction to access the V-Safe system.  Vaccinated by [facility] Brunette  [name] was instructed to call 911 with any severe reactions post vaccine: [name] Kitchen Difficulty breathing  . Swelling of face and throat  . A fast heartbeat  . A bad rash all over body  . Dizziness and weakness   Immunizations Administered    Name Date Dose VIS Date Route   Pfizer COVID-19 Vaccine 09/18/2020  1:37 PM 0.3 mL 07/11/2020 Intramuscular   Manufacturer: [place]   Lot: [id number]   [place]: [id number]

## 2020-09-19 MED FILL — PFIZER-BIONTECH COVID-19 VA: 30 | 21 days supply | Qty: 0 | Fill #0

## 2020-10-01 ENCOUNTER — Encounter (INDEPENDENT_AMBULATORY_CARE_PROVIDER_SITE_OTHER): Payer: Medicare Other | Admitting: Ophthalmology

## 2020-10-04 ENCOUNTER — Other Ambulatory Visit: Payer: Self-pay

## 2020-10-04 ENCOUNTER — Encounter (INDEPENDENT_AMBULATORY_CARE_PROVIDER_SITE_OTHER): Payer: Self-pay | Admitting: Ophthalmology

## 2020-10-04 ENCOUNTER — Ambulatory Visit (INDEPENDENT_AMBULATORY_CARE_PROVIDER_SITE_OTHER): Payer: Medicare Other | Admitting: Ophthalmology

## 2020-10-04 DIAGNOSIS — E113592 Type 2 diabetes mellitus with proliferative diabetic retinopathy without macular edema, left eye: Secondary | ICD-10-CM

## 2020-10-04 DIAGNOSIS — E113551 Type 2 diabetes mellitus with stable proliferative diabetic retinopathy, right eye: Secondary | ICD-10-CM | POA: Diagnosis not present

## 2020-10-04 DIAGNOSIS — Z961 Presence of intraocular lens: Secondary | ICD-10-CM

## 2020-10-04 DIAGNOSIS — E113591 Type 2 diabetes mellitus with proliferative diabetic retinopathy without macular edema, right eye: Secondary | ICD-10-CM | POA: Diagnosis not present

## 2020-10-04 DIAGNOSIS — Z794 Long term (current) use of insulin: Secondary | ICD-10-CM

## 2020-10-04 NOTE — Assessment & Plan Note (Signed)
No new NV

## 2020-10-04 NOTE — Progress Notes (Signed)
10/04/2020     CHIEF COMPLAINT Patient presents for Retina Follow Up (4 Month Diabetic f\u OU. OCT/Pt states vision has been stable. Denies any issues./BGL: 167/A1C: 9.1)   HISTORY OF PRESENT ILLNESS: Micheal [name] is a 65 y.o. male who presents to the clinic today for:   HPI    Retina Follow Up    Patient presents with  Diabetic Retinopathy.  In right eye.  Severity is moderate.  Duration of 4 months.  Since onset it is stable.  I, the attending physician,  performed the HPI with the patient and updated documentation appropriately. Additional comments: 4 Month Diabetic f\u OU. OCT Pt states vision has been stable. Denies any issues. BGL: 167 A1C: 9.1       Last edited by Tilda Franco on 10/04/2020  9:58 AM. (History)      Referring physician: Maurice Small, MD [address],  [place] [address]  HISTORICAL INFORMATION:   Selected notes from the MEDICAL RECORD NUMBER    Lab Results  Component Value Date   HGBA1C 8.6 (A) 06/28/2020     CURRENT MEDICATIONS: Current Outpatient Medications (Ophthalmic Drugs)  Medication Sig  . ofloxacin (OCUFLOX) 0.3 % ophthalmic solution Place 1 drop into the right eye 4 (four) times daily.  . Prednisolon-Gatiflox-Bromfenac 1-0.5-0.075 % SUSP Place 1 drop into the right eye 4 (four) times daily.  . prednisoLONE acetate (PRED FORTE) 1 % ophthalmic suspension Place 1 drop into the right eye 4 (four) times daily.   No current facility-administered medications for this visit. (Ophthalmic Drugs)   Current Outpatient Medications (Other)  Medication Sig  . amLODipine (NORVASC) 10 MG tablet Take 10 mg by mouth daily.  [name] Kitchen aspirin 81 MG EC tablet Take by mouth.  [name] Kitchen atorvastatin (LIPITOR) 40 MG tablet Take 1 tablet (40 mg total) by mouth daily.  . calcitRIOL (ROCALTROL) 0.5 MCG capsule Take 1 capsule (0.5 mcg total) by mouth every Monday, Wednesday, and Friday with hemodialysis.  [name] Kitchen calcium acetate (PHOSLO) 667 MG capsule  Take by mouth 3 (three) times daily with meals.  . Continuous Blood Gluc Sensor (FREESTYLE LIBRE 14 DAY SENSOR) MISC USE TO CHECK BLOOD SUGAR  . fluticasone (FLONASE) 50 MCG/ACT nasal spray Place 2 sprays into both nostrils daily.  [name] Kitchen glucose blood (ONETOUCH VERIO) test strip Four times daily  . hydrALAZINE (APRESOLINE) 25 MG tablet Take 1 tablet (25 mg total) by mouth 3 (three) times daily.  . Insulin Pen Needle (B-D UF III MINI PEN NEEDLES) 31G X 5 MM MISC USE FOUR TIMES DAILY AS DIRECTED  . isosorbide dinitrate (ISORDIL) 30 MG tablet Take 1 tablet (30 mg total) by mouth 3 (three) times daily. Please call and schedule office visit for further refills 1st attempt  . metoprolol (TOPROL-XL) 200 MG 24 hr tablet Take 1 tablet (200 mg total) by mouth daily. Please call and schedule office visit for further refills 1st attempt  . nitroGLYCERIN (NITROSTAT) 0.4 MG SL tablet Place under the tongue.  [name] Kitchen omega-3 acid ethyl esters (LOVAZA) 1 G capsule Take 1 g by mouth daily.    [name] Kitchen omeprazole (PRILOSEC) 20 MG capsule Take 20 mg by mouth daily as needed.   . sertraline (ZOLOFT) 50 MG tablet Take 0.5 tablets by mouth daily.   . Transparent Dressings (TRANSPARENT I.V. SITE DRESSING) MISC 1 Device by Does not apply route as directed.   No current facility-administered medications for this visit. (Other)      REVIEW OF  SYSTEMS:    ALLERGIES No Known Allergies  PAST MEDICAL HISTORY Past Medical History:  Diagnosis Date  . Acute respiratory failure with hypoxia ([place])   . Anemia   . CAP (community acquired pneumonia)   . Chronic diastolic heart failure ([place])   . Chronic kidney disease    he is s/p AVF but not on HD yet  . Diabetes mellitus   . Hypertension   . Nuclear sclerotic cataract of right eye 12/29/2019  . Obesity (BMI 30-39.9)   . OSA (obstructive sleep apnea)    severe with AHI 72/hr  . 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 Vitreous hemorrhage of right eye ([place]) 12/29/2019   Past Surgical History:  Procedure Laterality Date  . AV FISTULA PLACEMENT    . CATARACT EXTRACTION Right 01/27/2020   Dr. Wyatt Portela  . PERCUTANEOUS CORONARY STENT INTERVENTION (PCI-S)  04/2017    FAMILY HISTORY Family History  Problem Relation Age of Onset  . Heart failure Father   . Heart disease Father   . Heart failure Other     SOCIAL HISTORY Social History   Tobacco Use  . Smoking status: Never Smoker  . Smokeless tobacco: Never Used  Substance Use Topics  . Alcohol use: No    Alcohol/week: 0.0 standard drinks  . Drug use: No         OPHTHALMIC EXAM:  Base Eye Exam    Visual Acuity (Snellen - Linear)      Right Left   Dist [place] 20/50 + 20/25 -1   Dist ph  20/25 -1        Tonometry (Tonopen, 10:02 AM)      Right Left   Pressure 17 15       Pupils      Pupils Dark Light Shape React APD   Right PERRL 3 3 Round Minimal None   Left PERRL 3 3 Round Minimal None       Visual Fields (Counting fingers)      Left Right    Full Full       Neuro/Psych    Oriented x3: Yes   Mood/Affect: Normal       Dilation    Both eyes: 1.0% Mydriacyl, 2.5% Phenylephrine @ 10:02 AM        Slit Lamp and Fundus Exam    External Exam      Right Left   External Normal Normal       Slit Lamp Exam      Right Left   Lids/Lashes Normal Normal   Conjunctiva/Sclera White and quiet White and quiet   Cornea Clear Clear   Anterior Chamber Deep and quiet Deep and quiet   Iris Round and reactive Round and reactive   Lens Posterior chamber intraocular lens 2+ Nuclear sclerosis   Anterior Vitreous Normal Normal       Fundus Exam      Right Left   Posterior Vitreous Vitrectomized, clear Normal   Disc old nvd, cauterized Normal   C/D Ratio 0.45 0.45   Macula Normal Normal   Vessels PDR-quiet Retinopathy,, proliferative diabetic retinopathy active   Periphery Good PRP 360 Nasal and temporal  PRP, no new NV          IMAGING AND PROCEDURES  Imaging and Procedures for 10/04/20           ASSESSMENT/PLAN:  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 Type 2 diabetes mellitus with left eye affected by proliferative retinopathy without macular edema, without long-term current use of insulin ([place])  R51.8841   3. Stable treated proliferative diabetic retinopathy of right eye determined by examination associated with type 2 diabetes mellitus ([place])  Y60.6301   4. Pseudophakia, right eye  Z96.1     1.  OD looks great with quiescent proliferative diabetic retinopathy, status post vitrectomy for vitreous hemorrhage and active PDR  2.  OS with less extensive early PDR, status post moderate scatter PRP pattern will need completion 1 day.  3.  Findings discussed with the patient.  Patient asked to bring a driver next time should this be undertaken at that time  Ophthalmic Meds Ordered this visit:  No orders of the defined types were placed in this encounter.      Return in about 6 months (around 04/03/2021) for DILATE OU, COLOR FP.  Patient Instructions  Patient instructed to contact us promptly for new onset visual acuity declines or distortion    Explained the diagnoses, plan, and follow up with the patient and they expressed understanding.  Patient expressed understanding of the importance of proper follow up care.   Clent Demark Reginold Beale M.D. Diseases & Surgery of the Retina and Vitreous Retina & Diabetic [place] 10/04/20     Abbreviations: M myopia (nearsighted); A astigmatism; H hyperopia (farsighted); P presbyopia;  Mrx spectacle prescription;  CTL contact lenses; OD right eye; OS left eye; OU both eyes  XT exotropia; ET esotropia; PEK punctate epithelial keratitis; PEE punctate epithelial erosions; DES dry eye syndrome; MGD meibomian gland dysfunction; ATs artificial tears; PFAT's preservative free artificial tears; [place] nuclear sclerotic cataract; PSC posterior subcapsular cataract; ERM epi-retinal membrane; PVD posterior vitreous detachment; RD retinal detachment; DM diabetes mellitus; DR diabetic retinopathy; NPDR non-proliferative diabetic retinopathy; PDR proliferative diabetic retinopathy; CSME clinically significant macular edema; DME diabetic macular edema; dbh dot blot hemorrhages; CWS cotton wool spot; POAG primary open angle glaucoma; C/D cup-to-disc ratio; HVF humphrey visual field; GVF goldmann visual field; OCT optical coherence tomography; IOP intraocular pressure; BRVO Branch retinal vein occlusion; CRVO central retinal vein occlusion; CRAO central retinal artery occlusion; BRAO branch retinal artery occlusion; RT retinal tear; SB scleral buckle; PPV pars plana vitrectomy; VH Vitreous hemorrhage; PRP panretinal laser photocoagulation; IVK intravitreal kenalog; VMT vitreomacular traction; MH Macular hole;  NVD neovascularization of the disc; NVE neovascularization elsewhere; AREDS age related eye disease study; ARMD age related macular degeneration; POAG primary open angle glaucoma; EBMD epithelial/anterior basement membrane dystrophy; ACIOL anterior chamber intraocular lens; IOL intraocular lens; PCIOL posterior chamber intraocular lens; Phaco/IOL phacoemulsification with intraocular lens placement; [place] photorefractive keratectomy; LASIK laser assisted in situ keratomileusis; HTN hypertension; DM diabetes mellitus; COPD chronic obstructive pulmonary disease

## 2020-10-04 NOTE — Assessment & Plan Note (Signed)
Only minor PC opacity will observe

## 2020-10-04 NOTE — Patient Instructions (Signed)
Patient instructed to contact us promptly for new onset visual acuity declines or distortion

## 2020-11-01 ENCOUNTER — Encounter: Payer: Self-pay | Admitting: Internal Medicine

## 2020-11-01 ENCOUNTER — Ambulatory Visit (INDEPENDENT_AMBULATORY_CARE_PROVIDER_SITE_OTHER): Payer: Medicare Other | Admitting: Internal Medicine

## 2020-11-01 ENCOUNTER — Other Ambulatory Visit: Payer: Self-pay

## 2020-11-01 VITALS — BP 144/92 | HR 80 | Ht 68.0 in | Wt 240.2 lb

## 2020-11-01 DIAGNOSIS — E1142 Type 2 diabetes mellitus with diabetic polyneuropathy: Secondary | ICD-10-CM | POA: Diagnosis not present

## 2020-11-01 DIAGNOSIS — E113591 Type 2 diabetes mellitus with proliferative diabetic retinopathy without macular edema, right eye: Secondary | ICD-10-CM | POA: Diagnosis not present

## 2020-11-01 DIAGNOSIS — E1122 Type 2 diabetes mellitus with diabetic chronic kidney disease: Secondary | ICD-10-CM

## 2020-11-01 DIAGNOSIS — Z794 Long term (current) use of insulin: Secondary | ICD-10-CM | POA: Diagnosis not present

## 2020-11-01 DIAGNOSIS — E1165 Type 2 diabetes mellitus with hyperglycemia: Secondary | ICD-10-CM | POA: Diagnosis not present

## 2020-11-01 DIAGNOSIS — Z992 Dependence on renal dialysis: Secondary | ICD-10-CM

## 2020-11-01 DIAGNOSIS — N186 End stage renal disease: Secondary | ICD-10-CM

## 2020-11-01 LAB — POCT GLYCOSYLATED HEMOGLOBIN (HGB A1C): Hemoglobin A1C: 8.3 % — AB (ref 4.0–5.6)

## 2020-11-01 NOTE — Patient Instructions (Addendum)
Enter 10 grams for the meals Enter 6 grams for the snack     BASAL SETTINGS: Time  Basal Rate (units/ hr)   0000 1.2    BOLUS SETTINGS: Insulin:Carb Ratio: 1 Sensitivity:30      HOW TO TREAT LOW BLOOD SUGARS (Blood sugar LESS THAN 70 MG/DL)  Please follow the RULE OF 15 for the treatment of hypoglycemia treatment (when your (blood sugars are less than 70 mg/dL)    STEP 1: Take 15 grams of carbohydrates when your blood sugar is low, which includes:   3-4 GLUCOSE TABS  OR  3-4 OZ OF JUICE OR REGULAR SODA OR  ONE TUBE OF GLUCOSE GEL     STEP 2: RECHECK blood sugar in 15 MINUTES STEP 3: If your blood sugar is still low at the 15 minute recheck --> then, go back to STEP 1 and treat AGAIN with another 15 grams of carbohydrates. He is

## 2020-11-01 NOTE — Progress Notes (Signed)
Name: Micheal [name]  Age/ Sex: 65 y.o., male   MRN/ DOB: [id number], July 04, 1956     PCP: Maurice Small, MD   Reason for Endocrinology Evaluation: Type 2 Diabetes Mellitus  Initial Endocrine Consultative Visit: 09/28/2018    PATIENT IDENTIFIER: Micheal [name] is a 65 y.o. male with a past medical history of DM, ESRD on HD (started on 03/2016), HTN, OSA. The patient has followed with Endocrinology clinic since 09/28/2018 for consultative assistance with management of his diabetes.  DIABETIC HISTORY:  Mr. Rummel was diagnosed with T2DM 1992,has tried Metformin and  Glipizide in the past. Started insulin therapy years ago. His hemoglobin A1c has ranged from 6.7% in 2017, peaking at 12.3 %in 07/2018 .  He was started on the Omnipod in 05/2020  SUBJECTIVE:   During the last visit (07/06/2020): A1c 8.6 %. We adjusted his pump settings    Today (11/01/2020): Mr. Herod is here for a follow up on diabetes .  He checks his blood sugars 3 times daily through  freestyle libre. The patient has not had hypoglycemic episodes since the last clinic visit.     Denies nausea, diarrhea  Denies SOB   HOME DIABETES REGIMEN:   Humalog 200/mL   PUMP SETTINGS & DOWNLOAD   BASAL SETTINGS: Time  Basal Rate (units/ hr)   0000 1.05    BOLUS SETTINGS: Insulin:Carb Ratio: 1 Sensitivity:35 Target BG: 100-130 Active Insulin Time: 4 hours   CURRENT PUMP STATISTICS: Average BG: 223 BG Readings:1.6 /day Average Daily Carbs (g): 5 Average Total Daily Insulin: 31.5 Average Daily Basal: 23.2(79%) Average Daily Bolus: 6.1(21 %)     CONTINUOUS GLUCOSE MONITORING RECORD INTERPRETATION    Dates of Recording:1/28-2/06/2021  Sensor description: freestyle libre   Results statistics:   CGM use % of time 55  Average and SD 184/25.9  Time in range  50 %  % Time Above 180 41  % Time above 250 9  % Time Below target 0    Glycemic patterns summary:  Hyperglycemia tmiddle of the day and at  night  Hypoglycemic episodes occurred none Overnight periods: trend down to within goal     DIABETIC COMPLICATIONS: Microvascular complications:   CKD , retinopathy on the right per pt S/P laser, neuropathy based on exam    Last eye exam: Completed 02/02/2020  Macrovascular complications:   CHF   Denies: PVD, CVA  HISTORY:  Past Medical History:  Past Medical History:  Diagnosis Date  . Acute respiratory failure with hypoxia ([place])   . Anemia   . CAP (community acquired pneumonia)   . Chronic diastolic heart failure ([place])   . Chronic kidney disease    he is s/p AVF but not on HD yet  . Diabetes mellitus   . Hypertension   . Nuclear sclerotic cataract of right eye 12/29/2019  . Obesity (BMI 30-39.9)   . OSA (obstructive sleep apnea)    severe with AHI 72/hr  . 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 Vitreous hemorrhage of right eye ([place]) 12/29/2019   Past Surgical History:  Past Surgical History:  Procedure Laterality Date  . AV FISTULA PLACEMENT    . CATARACT EXTRACTION Right 01/27/2020   Dr. Wyatt Portela  . PERCUTANEOUS CORONARY STENT INTERVENTION (PCI-S)  04/2017    Social History:  reports that he has never smoked. He has never used smokeless tobacco. He reports that he does not  drink alcohol and does not use drugs. Family History:  Family History  Problem Relation Age of Onset  . Heart failure Father   . Heart disease Father   . Heart failure Other      HOME MEDICATIONS: Allergies as of 11/01/2020   No Known Allergies     Medication List       Accurate as of November 01, 2020 11:23 AM. If you have any questions, ask your nurse or doctor.        amLODipine 10 MG tablet Commonly known as: NORVASC Take 10 mg by mouth daily.   aspirin 81 MG EC tablet Take by mouth.   atorvastatin 20 MG tablet Commonly known as: LIPITOR Take by mouth. What changed: Another  medication with the same name was removed. Continue taking this medication, and follow the directions you see here. Changed by: Dorita Sciara, MD   B-D UF III MINI PEN NEEDLES 31G X 5 MM Misc Generic drug: Insulin Pen Needle USE FOUR TIMES DAILY AS DIRECTED   calcitRIOL 0.5 MCG capsule Commonly known as: ROCALTROL Take 1 capsule (0.5 mcg total) by mouth every Monday, Wednesday, and Friday with hemodialysis.   calcium acetate 667 MG capsule Commonly known as: PHOSLO Take by mouth 3 (three) times daily with meals.   fluticasone 50 MCG/ACT nasal spray Commonly known as: FLONASE Place 2 sprays into both nostrils daily.   FreeStyle Libre 14 Day Sensor Misc USE TO CHECK BLOOD SUGAR   glucose blood test strip Commonly known as: OneTouch Verio Four times daily   HumaLOG KwikPen 200 UNIT/ML KwikPen Generic drug: insulin lispro Inject into the skin.   hydrALAZINE 25 MG tablet Commonly known as: APRESOLINE Take 1 tablet (25 mg total) by mouth 3 (three) times daily.   isosorbide dinitrate 30 MG tablet Commonly known as: ISORDIL Take 1 tablet (30 mg total) by mouth 3 (three) times daily. Please call and schedule office visit for further refills 1st attempt   metoprolol 200 MG 24 hr tablet Commonly known as: TOPROL-XL Take 1 tablet (200 mg total) by mouth daily. Please call and schedule office visit for further refills 1st attempt   nitroGLYCERIN 0.4 MG SL tablet Commonly known as: NITROSTAT Place under the tongue.   ofloxacin 0.3 % ophthalmic solution Commonly known as: OCUFLOX Place 1 drop into the right eye 4 (four) times daily.   omega-3 acid ethyl esters 1 g capsule Commonly known as: LOVAZA Take 1 g by mouth daily.   omeprazole 20 MG capsule Commonly known as: PRILOSEC Take 20 mg by mouth daily as needed.   Prednisolon-Gatiflox-Bromfenac 1-0.5-0.075 % Susp Place 1 drop into the right eye 4 (four) times daily.   prednisoLONE acetate 1 % ophthalmic  suspension Commonly known as: PRED FORTE Place 1 drop into the right eye 4 (four) times daily.   sertraline 50 MG tablet Commonly known as: ZOLOFT Take 0.5 tablets by mouth daily.   Transparent I.V. Site Dressing Misc 1 Device by Does not apply route as directed.       OBJECTIVE:   Vital Signs: BP (!) 144/92   Pulse 80   Ht '5\' 8"'$  (1.727 m)   Wt 240 lb 4 oz (109 kg)   SpO2 98%   BMI 36.53 kg/m    General: Pt appears well and is in NAD  Lungs: Clear with good BS bilat with no rales, rhonchi, or wheezes  Heart: RRR with normal S1 and S2 and no gallops; no murmurs; no rub  Abdomen: soft, nontender, RLQ lipohypertrophic area  Extremities: No pretibial edema.  Neuro: MS is good with appropriate affect, pt is alert and Ox3      DM foot exam:11/01/2020 The skin of the feet is intact without sores or ulcerations. The pedal pulses are 2+ on right and 2+ on left. The sensation is intact  to a screening 5.07, 10 gram monofilament on the left       DATA REVIEWED:     .02High  Reference    Potassium, Bld 3.6 - 5.1 mmol/L 5.1   Chloride 97 - 110 mmol/L 98   CO2 Content 22 - 32 mmol/L 22   Calcium 8.9 - 10.3 mg/dL 8.5Low   Phosphorus 2.5 - 5.5 mg/dL 6.3High   CA X PHOS 15.0 - 75.0 53.6   Alkaline Phosphatase 38 - 195 IU/L 66   SGOT (AST) 12 - 50 IU/L 11Low   LDH 140 - 271 IU/L 173   PROTEIN       6.2 - 8.3 g/dL 7.3   ALBUMIN 3.5 - 5.2 g/dL 3.9   Albumin/Globulin Ratio 1.1 - 1.9 Ratio 1.2   Sodium 136 - 144 mmol/L 138   ALT 17 - 63 IU/L 15Low     ASSESSMENT / PLAN / RECOMMENDATIONS:   1) Type 2 Diabetes Mellitus, Poorly controlled, With Renal complications (ESRD on HD), neuropathic and retinal complication - Most recent A1c of 8.3 %. Goal A1c < 7.0 %.     - His Aq1c is improving.  - But he continues not to bolus for meals. He is depending on basal rate and correction.  - I have again encouraged him to bolus with meals ( 10 grams for a meal) and  5-6 units for a snack  -  Will adjust basal rate and SF as below      MEDICATIONS: Humalog 200 u/mL   BASAL SETTINGS: Time  Basal Rate (units/ hr)   0000 1.2    BOLUS SETTINGS: Insulin:Carb Ratio: 1 Sensitivity: 30 Target BG: 100-130 Active Insulin Time: 4 hours      EDUCATION / INSTRUCTIONS:  BG monitoring instructions: Patient is instructed to check his blood sugars 4 times a day, before each meal and bedtime.  Call [facility] if: BG persistently < 70 . I reviewed the Rule of 15 for the treatment of hypoglycemia in detail with the patient. Literature supplied.    2) Diabetic complications:   Eye: Does have known diabetic retinopathy.   Neuro/ Feet: Does have known diabetic peripheral neuropathy based on foot exam on 10/27/2019  Renal: Patient does  have known ESRD, patient is on HD   F/U in 4 months    Signed electronically by: Mack Guise, MD  [facility]  [address] [place], [place] [address] Phone: [phone] FAX: [phone]   CC: Maurice Small, MD [address] [address] Phone: [phone]  Fax: [phone]  Return to Endocrinology clinic as below: Future Appointments  Date Time Provider [place]  04/04/2021  9:45 AM Rankin, Clent Demark, MD RDE-RDE None

## 2020-11-26 ENCOUNTER — Other Ambulatory Visit: Payer: Self-pay | Admitting: Internal Medicine

## 2020-11-26 MED ORDER — BD PEN NEEDLE MINI U/F 31G X 5 MM MISC: 11 refills | Status: AC

## 2020-11-27 ENCOUNTER — Telehealth: Payer: Self-pay | Admitting: Internal Medicine

## 2020-11-27 NOTE — Telephone Encounter (Signed)
Pt's insurance no longer covers Humalog and will cover Novalog instead.  MEDICATION: Novalog  PHARMACY:   [id number] [address] [place] [address]  [phone] Fax:  [phone]     HAS THE PATIENT CONTACTED THEIR PHARMACY?  yes  IS THIS A 90 DAY SUPPLY : doesn't know because it will be new prescription  IS PATIENT OUT OF MEDICATION: no  IF NOT; HOW MUCH IS LEFT: 2 days  LAST APPOINTMENT DATE: '@3'$ /03/2021  NEXT APPOINTMENT DATE:'@8'$ /18/2022  DO WE HAVE YOUR PERMISSION TO LEAVE A DETAILED MESSAGE?:  OTHER COMMENTS:    **Let patient know to contact pharmacy at the end of the day to make sure medication is ready. **  ** Please notify patient to allow 48-72 hours to process**  **Encourage patient to contact the pharmacy for refills or [facility]

## 2020-11-28 MED ORDER — INSULIN ASPART 100 UNIT/ML ~~LOC~~ SOLN: SUBCUTANEOUS | 3 refills | Status: DC

## 2020-11-28 NOTE — Telephone Encounter (Signed)
Spoken to patient and notified Dr Quin Hoop comments. Verbalized understanding.   PA have been submitted to patient's insurance.

## 2020-11-28 NOTE — Telephone Encounter (Signed)
Ok to change

## 2020-12-03 NOTE — Telephone Encounter (Signed)
Received fax from patient's insurance that they have approved Humalog Kwikpen through  11/30/2020-11/30/2021  As long as patient remain covered by his prescriptions plan and there is no changes to plane benefits

## 2020-12-13 ENCOUNTER — Other Ambulatory Visit (HOSPITAL_COMMUNITY): Payer: Self-pay | Admitting: Psychiatry

## 2020-12-18 ENCOUNTER — Telehealth: Payer: Self-pay | Admitting: Internal Medicine

## 2020-12-18 MED ORDER — FREESTYLE LIBRE 2 SENSOR MISC: 2 refills | Status: AC

## 2020-12-18 NOTE — Telephone Encounter (Signed)
MEDICATION: Free Style Libre 2 Sensors  [facility]  [facility] [address] [facility] [place] [facility]TACTED [place]?  yes  IS THIS A 90 DAY SUPPLY :   IS PATIENT OUT OF MEDICATION: yes  IF NOT; HOW MUCH IS LEFT:   LAST APPOINTMENT DATE: '@3'$ /04/2021  NEXT APPOINTMENT DATE:'@8'$ /18/2022  DO WE HAVE YOUR PERMISSION TO LEAVE A DETAILED MESSAGE?: [phone], yes  OTHER COMMENTS: patient is also requesting that we contact DME supplier [employer] regarding their request for a RX for Dexcom G6 - he is going to have to transition to Dexcom because of insurance coverage changes  **Let patient know to contact pharmacy at the end of the day to make sure medication is ready. **  ** Please notify patient to allow 48-72 hours to process**  **Encourage patient to contact the pharmacy for refills or they can request refills through [facility]**

## 2020-12-18 NOTE — Telephone Encounter (Signed)
Updated. [facility] [employer] [facility]

## 2021-03-18 ENCOUNTER — Other Ambulatory Visit: Payer: Self-pay | Admitting: Internal Medicine

## 2021-03-18 NOTE — Telephone Encounter (Signed)
Pt verified been taking Humalog,-which not on the meds list but Novolog. Please advise

## 2021-04-04 ENCOUNTER — Other Ambulatory Visit: Payer: Self-pay

## 2021-04-04 ENCOUNTER — Ambulatory Visit (INDEPENDENT_AMBULATORY_CARE_PROVIDER_SITE_OTHER): Payer: Medicare Other | Admitting: Ophthalmology

## 2021-04-04 ENCOUNTER — Encounter (INDEPENDENT_AMBULATORY_CARE_PROVIDER_SITE_OTHER): Payer: Self-pay | Admitting: Ophthalmology

## 2021-04-04 DIAGNOSIS — Z794 Long term (current) use of insulin: Secondary | ICD-10-CM | POA: Diagnosis not present

## 2021-04-04 DIAGNOSIS — G4733 Obstructive sleep apnea (adult) (pediatric): Secondary | ICD-10-CM | POA: Diagnosis not present

## 2021-04-04 DIAGNOSIS — E113592 Type 2 diabetes mellitus with proliferative diabetic retinopathy without macular edema, left eye: Secondary | ICD-10-CM

## 2021-04-04 DIAGNOSIS — E113591 Type 2 diabetes mellitus with proliferative diabetic retinopathy without macular edema, right eye: Secondary | ICD-10-CM

## 2021-04-04 NOTE — Assessment & Plan Note (Signed)
Patient reports not using CPAP now for over 1 year

## 2021-04-04 NOTE — Progress Notes (Signed)
04/04/2021     CHIEF COMPLAINT Patient presents for Retina Follow Up (6 month fu OU and OCT/FP/Pt states VA OU stable since last visit. Pt denies FOL, floaters, or ocular pain OU. Micheal [name]: 5.0/LBS: 170/)   HISTORY OF PRESENT ILLNESS: Micheal [name] is a 65 y.o. male who presents to the clinic today for:   HPI     Retina Follow Up           Diagnosis: Diabetic Retinopathy   Laterality: both eyes   Onset: 6 months ago   Severity: mild   Duration: 6 months   Course: stable   Comments: 6 month fu OU and OCT/FP Pt states VA OU stable since last visit. Pt denies FOL, floaters, or ocular pain OU.  A1C: 5.0 LBS: 170        Last edited by Kendra Opitz, COA on 04/04/2021  9:47 AM.      Referring physician: Maurice Small, [name] [address],  [place] [address]  HISTORICAL INFORMATION:   Selected notes from the MEDICAL RECORD NUMBER    Lab Results  Component Value Date   HGBA1C 8.3 (A) 11/01/2020     CURRENT MEDICATIONS: Current Outpatient Medications (Ophthalmic Drugs)  Medication Sig   ofloxacin (OCUFLOX) 0.3 % ophthalmic solution Place 1 drop into the right eye 4 (four) times daily.   Prednisolon-Gatiflox-Bromfenac 1-0.5-0.075 % SUSP Place 1 drop into the right eye 4 (four) times daily.   prednisoLONE acetate (PRED FORTE) 1 % ophthalmic suspension Place 1 drop into the right eye 4 (four) times daily.   No current facility-administered medications for this visit. (Ophthalmic Drugs)   Current Outpatient Medications (Other)  Medication Sig   amLODipine (NORVASC) 10 MG tablet Take 10 mg by mouth daily.   aspirin 81 MG EC tablet Take by mouth.   atorvastatin (LIPITOR) 20 MG tablet Take by mouth.   calcitRIOL (ROCALTROL) 0.5 MCG capsule Take 1 capsule (0.5 mcg total) by mouth every Monday, Wednesday, and Friday with hemodialysis.   calcium acetate (PHOSLO) 667 MG capsule Take by mouth 3 (three) times daily with meals.   Continuous Blood Gluc Sensor  (FREESTYLE LIBRE 14 DAY SENSOR) MISC USE TO CHECK BLOOD SUGAR   Continuous Blood Gluc Sensor (FREESTYLE LIBRE 2 SENSOR) MISC Use as instructed to check blood sugar daily   COVID-19 mRNA vaccine, Pfizer, 30 MCG/0.3ML injection INJECT AS DIRECTED   fluticasone (FLONASE) 50 MCG/ACT nasal spray Place 2 sprays into both nostrils daily.   glucose blood (ONETOUCH VERIO) test strip Four times daily   hydrALAZINE (APRESOLINE) 25 MG tablet Take 1 tablet (25 mg total) by mouth 3 (three) times daily.   insulin aspart (NOVOLOG) 100 UNIT/ML injection Max daily 100 units through pump   insulin lispro (HUMALOG KWIKPEN) 200 UNIT/ML KwikPen 50 units daily per pump   Insulin Pen Needle (B-D UF III MINI PEN NEEDLES) 31G X 5 MM MISC USE FOUR TIMES DAILY AS DIRECTED   isosorbide dinitrate (ISORDIL) 30 MG tablet Take 1 tablet (30 mg total) by mouth 3 (three) times daily. Please call and schedule office visit for further refills 1st attempt   metoprolol (TOPROL-XL) 200 MG 24 hr tablet Take 1 tablet (200 mg total) by mouth daily. Please call and schedule office visit for further refills 1st attempt   nitroGLYCERIN (NITROSTAT) 0.4 MG SL tablet Place under the tongue.   omega-3 acid ethyl esters (LOVAZA) 1 G capsule Take 1 g by mouth daily.  omeprazole (PRILOSEC) 20 MG capsule Take 20 mg by mouth daily as needed.   sertraline (ZOLOFT) 50 MG tablet Take 0.5 tablets by mouth daily.    Transparent Dressings (TRANSPARENT I.V. SITE DRESSING) MISC 1 Device by Does not apply route as directed.   No current facility-administered medications for this visit. 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 Wyatt [name]   PERCUTANEOUS CORONARY STENT INTERVENTION (PCI-S)  04/2017    FAMILY HISTORY Family History  Problem Relation Age of Onset   Heart failure Father    Heart disease Father    Heart failure Other     SOCIAL HISTORY Social History   Tobacco Use   Smoking status: Never   Smokeless tobacco: Never  Substance Use Topics   Alcohol use: No    Alcohol/week: 0.0 standard drinks   Drug use: No         OPHTHALMIC EXAM:  Base Eye Exam     Visual Acuity (ETDRS)       Right Left   Dist [place] 20/50 +2 20/20 -1   Dist ph [facility] 20/25 -1          Tonometry (Tonopen, 9:51 AM)       Right Left   Pressure 18 17         Pupils       Pupils Dark Light Shape React APD   Right PERRL 3 2 Round Brisk None   Left PERRL 3 2 Round Brisk None         Visual Fields (Counting fingers)       Left Right    Full Full         Extraocular Movement       Right Left    Full Full         Neuro/Psych     Oriented x3: Yes   Mood/Affect: Normal         Dilation     Both eyes: 1.0% Mydriacyl, 2.5% Phenylephrine @ 9:51 AM           Slit Lamp and Fundus Exam     External Exam       Right Left   External Normal Normal         Slit Lamp Exam       Right Left   Lids/Lashes Normal Normal   Conjunctiva/Sclera White and quiet White and quiet   Cornea Clear Clear   Anterior Chamber Deep and quiet Deep and quiet   Iris Round and reactive Round and reactive   Lens Posterior chamber intraocular lens 2+ Nuclear  sclerosis   Anterior  Vitreous Normal Normal         Fundus Exam       Right Left   Posterior Vitreous Vitrectomized, clear Normal   Disc old nvd, cauterized Normal   C/D Ratio 0.5 0.5   Macula Normal Normal   Vessels PDR-quiet  PDR-quiet   Periphery Good PRP 360 Nasal and temporal PRP, no new NV            IMAGING AND PROCEDURES  Imaging and Procedures for 04/04/21  OCT, Retina - OU - Both Eyes       Right Eye Quality was good. Scan locations included subfoveal. Central Foveal Thickness: 296. Progression has been stable. Findings include abnormal foveal contour.   Left Eye Quality was good. Scan locations included subfoveal. Central Foveal Thickness: 278. Progression has been stable. Findings include vitreomacular adhesion .   Notes Less CME OD No active CSME OS      Color Fundus Photography Optos - OU - Both Eyes       Right Eye Progression has improved. Disc findings include normal observations. Macula : normal observations.   Left Eye Progression has improved. Disc findings include normal observations. Macula : normal observations.   Notes Bilateral proliferative diabetic retinopathy, now quiescent OD status post vitrectomy and removal of neovascularization of the disc and elsewhere.    Clear media OU, stable Good PRP OS, no active NVE             ASSESSMENT/PLAN:  OSA (obstructive sleep apnea) Patient reports not using CPAP now for over 1 year     ICD-10-CM   1. Type 2 diabetes mellitus with right eye affected by proliferative retinopathy without macular edema, with long-term current use of insulin (HCC)  E11.3591 OCT, Retina - OU - Both Eyes   Z79.4 Color Fundus Photography Optos - OU - Both Eyes    2. Type 2 diabetes mellitus with left eye affected by proliferative retinopathy without macular edema, without long-term current use of insulin (HCC)  E11.3592 OCT, Retina - OU - Both Eyes    Color Fundus Photography Optos - OU - Both Eyes    3. OSA (obstructive  sleep apnea)  G47.33       1.  Quiescent proliferative diabetic retinopathy right eye, status post vitrectomy for dense vitreous hemorrhage and progressive neovascular disease.  OS also with quiescent proliferative diabetic retinopathy status post PRP no signs of progression  2.  3.  Obstructive sleep apnea, severe measured in 2015.  Patient reports she is not been able to remain compliant due to mask issues and other barriers to successful use.  I explained the patient has risk of heart attack and stroke are magnified up to 10 fold without therapy successfully for this condition.  Vascular return for to his PCP for further options if any  Ophthalmic Meds Ordered this visit:  No orders of the defined types were placed in this encounter.      Return in about 6 months (around 10/05/2021) for DILATE OU, COLOR FP, OCT.  There are no Patient Instructions on file for this visit.   Explained the diagnoses, plan, and follow up with the patient and they expressed understanding.  Patient expressed understanding of the importance of proper follow up care.   Clent Demark Briyah Wheelwright M.D. Diseases & Surgery of the Retina and Vitreous Retina & Diabetic [place] 04/04/21     Abbreviations: M myopia (nearsighted); A astigmatism; H hyperopia (farsighted); P presbyopia; Mrx  spectacle prescription;  CTL contact lenses; OD right eye; OS left eye; OU both eyes  XT exotropia; ET esotropia; PEK punctate epithelial keratitis; PEE punctate epithelial erosions; DES dry eye syndrome; MGD meibomian gland dysfunction; ATs artificial tears; PFAT's preservative free artificial tears; [place] nuclear sclerotic cataract; PSC posterior subcapsular cataract; ERM epi-retinal membrane; PVD posterior vitreous detachment; RD retinal detachment; DM diabetes mellitus; DR diabetic retinopathy; NPDR non-proliferative diabetic retinopathy; PDR proliferative diabetic retinopathy; CSME clinically significant macular edema; DME diabetic  macular edema; dbh dot blot hemorrhages; CWS cotton wool spot; POAG primary open angle glaucoma; C/D cup-to-disc ratio; HVF humphrey visual field; GVF goldmann visual field; OCT optical coherence tomography; IOP intraocular pressure; BRVO Branch retinal vein occlusion; CRVO central retinal vein occlusion; CRAO central retinal artery occlusion; BRAO branch retinal artery occlusion; RT retinal tear; SB scleral buckle; PPV pars plana vitrectomy; VH Vitreous hemorrhage; PRP panretinal laser photocoagulation; IVK intravitreal kenalog; VMT vitreomacular traction; MH Macular hole;  NVD neovascularization of the disc; NVE neovascularization elsewhere; AREDS age related eye disease study; ARMD age related macular degeneration; POAG primary open angle glaucoma; EBMD epithelial/anterior basement membrane dystrophy; ACIOL anterior chamber intraocular lens; IOL intraocular lens; PCIOL posterior chamber intraocular lens; Phaco/IOL phacoemulsification with intraocular lens placement; [place] photorefractive keratectomy; LASIK laser assisted in situ keratomileusis; HTN hypertension; DM diabetes mellitus; COPD chronic obstructive pulmonary disease

## 2021-05-09 ENCOUNTER — Ambulatory Visit: Payer: Medicare Other | Admitting: Internal Medicine

## 2021-05-15 ENCOUNTER — Other Ambulatory Visit: Payer: Self-pay

## 2021-05-15 ENCOUNTER — Encounter: Payer: Self-pay | Admitting: Emergency Medicine

## 2021-05-15 ENCOUNTER — Ambulatory Visit
Admission: EM | Admit: 2021-05-15 | Discharge: 2021-05-15 | Disposition: A | Discharge status: Home / Self Care | Payer: Medicare Other | Attending: Emergency Medicine | Admitting: Emergency Medicine

## 2021-05-15 DIAGNOSIS — J069 Acute upper respiratory infection, unspecified: Secondary | ICD-10-CM

## 2021-05-15 MED ORDER — GUAIFENESIN-CODEINE 100-10 MG/5ML PO SOLN: 5.0000 mL | Freq: Three times a day (TID) | ORAL | 0 refills | Status: AC | PRN

## 2021-05-15 MED ORDER — BENZONATATE 200 MG PO CAPS: 200.0000 mg | ORAL_CAPSULE | Freq: Three times a day (TID) | ORAL | 0 refills | Status: AC | PRN

## 2021-05-15 NOTE — ED Provider Notes (Signed)
[facility]    CSN: [id number] Arrival date & time: 05/15/21  1254      History   Chief Complaint Chief Complaint  Patient presents with   URI    HPI Micheal [name] is a 65 y.o. male history of ESRD on dialysis, DM type II, hypertension, presenting today for evaluation of URI symptoms.  Reports associated cough and congestion over the past 2 days.  Wife here with similar symptoms.  Reports COVID PCR pending from dialysis this morning.  Denies known fevers.  Using cough drops without full relief of symptoms.  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, with long-term current use of insulin ([place]) 10/27/2019   Vitreous hemorrhage of right eye ([place]) 12/29/2019    Patient Active Problem List   Diagnosis Date Noted   Pseudophakia, right eye 10/04/2020   Follow-up examination after eye surgery 03/22/2020   Stable treated proliferative diabetic retinopathy of right eye determined by examination associated with type 2 diabetes mellitus ([place]) 12/29/2019   Type 2 diabetes mellitus with proliferative diabetic retinopathy of left eye without macular edema ([place]) 12/29/2019   Cortical age-related cataract, left eye 12/29/2019   Type 2 diabetes mellitus with diabetic polyneuropathy, with long-term current use of insulin ([place]) 10/27/2019   Type 2 diabetes mellitus with chronic kidney disease on chronic dialysis, with long-term current use of insulin ([place]) 12/28/2018   ESRD on dialysis ([place]) 04/19/2016   Acute on chronic renal failure ([place])  04/12/2016   CAP (community acquired pneumonia) 04/12/2016   Acute respiratory failure with hypoxia ([place]) 04/12/2016   Anemia 04/12/2016   Acute pulmonary edema ([place]) 04/12/2016   Acute respiratory failure ([place]) 04/12/2016   Hyperkalemia 04/12/2016   Acidosis 04/12/2016   Chronic diastolic heart failure (HCC)    Accelerated hypertension    OSA (obstructive sleep apnea)    Obesity (BMI 30-39.9)    Chronic kidney disease    Hypertension     Past Surgical History:  Procedure Laterality Date   AV FISTULA PLACEMENT     CATARACT EXTRACTION Right 01/27/2020   Dr. Wyatt Portela   PERCUTANEOUS CORONARY STENT INTERVENTION (PCI-S)  04/2017       Home Medications    Prior to Admission medications   Medication Sig Start Date End Date Taking? Authorizing Provider  benzonatate (TESSALON) 200 MG capsule Take 1 capsule (200 mg total) by mouth 3 (three) times daily as needed for up to 7 days for cough. 05/15/21 05/22/21 Yes Osborne Serio C, PA-C  guaiFENesin-codeine 100-10 MG/5ML syrup Take 5-10 mLs by mouth 3 (three) times daily as needed for cough. 05/15/21  Yes Zeric Baranowski C, PA-C  amLODipine (NORVASC) 10 MG tablet Take 10 mg by mouth daily.    [provider]  aspirin 81 MG EC tablet Take by mouth. 09/02/18   [provider]  atorvastatin (LIPITOR) 20 MG tablet Take by mouth. 10/25/20   [provider]  calcitRIOL (ROCALTROL)  0.5 MCG capsule Take 1 capsule (0.5 mcg total) by mouth every Monday, Wednesday, and Friday with hemodialysis. 04/21/16   Reyne Dumas, MD  calcium acetate (PHOSLO) 667 MG capsule Take by mouth 3 (three) times daily with meals.    [provider]  Continuous Blood Gluc Sensor (FREESTYLE LIBRE 14 DAY SENSOR) MISC USE TO CHECK BLOOD SUGAR 04/30/20   Elayne Snare, MD  Continuous Blood Gluc Sensor (FREESTYLE LIBRE 2 SENSOR) MISC Use as instructed to check blood sugar daily 12/18/20   Shamleffer, Melanie Crazier, MD  COVID-19 mRNA vaccine,  Pfizer, 30 MCG/0.3ML injection INJECT AS DIRECTED 09/18/20 09/18/21  Carlyle Basques, MD  fluticasone (FLONASE) 50 MCG/ACT nasal spray Place 2 sprays into both nostrils daily. 04/24/15   [provider]  glucose blood (ONETOUCH VERIO) test strip Four times daily 09/28/18   Shamleffer, Melanie Crazier, MD  hydrALAZINE (APRESOLINE) 25 MG tablet Take 1 tablet (25 mg total) by mouth 3 (three) times daily. 04/17/16   Reyne Dumas, MD  insulin aspart (NOVOLOG) 100 UNIT/ML injection Max daily 100 units through pump 11/28/20   Shamleffer, Melanie Crazier, MD  insulin lispro (HUMALOG KWIKPEN) 200 UNIT/ML KwikPen 50 units daily per pump 03/18/21   Shamleffer, Melanie Crazier, MD  Insulin Pen Needle (B-D UF III MINI PEN NEEDLES) 31G X 5 MM MISC USE FOUR TIMES DAILY AS DIRECTED 11/26/20   Shamleffer, Melanie Crazier, MD  isosorbide dinitrate (ISORDIL) 30 MG tablet Take 1 tablet (30 mg total) by mouth 3 (three) times daily. Please call and schedule office visit for further refills 1st attempt 08/18/18   Skeet Latch, MD  metoprolol (TOPROL-XL) 200 MG 24 hr tablet Take 1 tablet (200 mg total) by mouth daily. Please call and schedule office visit for further refills 1st attempt 08/18/18   Skeet Latch, MD  nitroGLYCERIN (NITROSTAT) 0.4 MG SL tablet Place under the tongue. 05/20/17   [provider]  ofloxacin (OCUFLOX) 0.3 % ophthalmic solution Place 1 drop into the right eye 4 (four) times daily. 03/19/20   Rankin, Clent Demark, MD  omega-3 acid ethyl esters (LOVAZA) 1 G capsule Take 1 g by mouth daily.    [provider]  omeprazole (PRILOSEC) 20 MG capsule Take 20 mg by mouth daily as needed.    [provider]  Prednisolon-Gatiflox-Bromfenac 1-0.5-0.075 % SUSP Place 1 drop into the right eye 4 (four) times daily. 01/10/20   [provider]  prednisoLONE acetate (PRED FORTE) 1 % ophthalmic suspension Place 1 drop into the right eye 4 (four) times daily. 03/19/20   Rankin, Clent Demark, MD  sertraline (ZOLOFT) 50 MG tablet Take 0.5 tablets by mouth daily.  05/27/15   [provider]  Transparent Dressings (TRANSPARENT I.V. SITE DRESSING) MISC 1 Device by Does not apply route as directed. 06/28/20   Shamleffer, Melanie Crazier, MD    Family History Family History  Problem Relation Age of Onset   Heart failure Father    Heart disease Father    Heart failure Other     Social History Social History   Tobacco Use   Smoking status: Never   Smokeless tobacco: Never  Substance Use Topics   Alcohol use: No    Alcohol/week: 0.0 standard drinks   Drug use: No     Allergies   Patient has no known allergies.   Review of Systems Review of Systems  Constitutional:  Negative for activity change, appetite change, chills, fatigue and fever.  HENT:  Positive for congestion and rhinorrhea.  Negative for ear pain, sinus pressure, sore throat and trouble swallowing.   Eyes:  Negative for discharge and redness.  Respiratory:  Positive for cough. Negative for chest tightness and shortness of breath.   Cardiovascular:  Negative for chest pain.  Gastrointestinal:  Negative for abdominal pain, diarrhea, nausea and vomiting.  Musculoskeletal:  Negative for myalgias.  Skin:  Negative for rash.  Neurological:  Negative for dizziness, light-headedness and headaches.    Physical Exam Triage Vital Signs ED Triage Vitals  Enc Vitals Group     BP      Pulse      Resp      Temp      Temp src      SpO2      Weight      Height      Head Circumference      Peak Flow      Pain Score      Pain Loc      Pain Edu?      Excl. in [place]?    No data found.  Updated Vital Signs BP (!) 142/88 (BP Location: Right Arm)   Pulse 76   Temp 98.2 F (36.8 C) (Oral)   Resp 18   SpO2 96%   Visual Acuity Right Eye Distance:   Left Eye Distance:   Bilateral Distance:    Right Eye Near:   Left Eye Near:    Bilateral Near:     Physical Exam Vitals and nursing note reviewed.   Constitutional:      Appearance: He is well-developed.     Comments: No acute distress  HENT:     Head: Normocephalic and atraumatic.     Ears:     Comments: Bilateral ears without tenderness to palpation of external auricle, tragus and mastoid, EAC's without erythema or swelling, TM's with good bony landmarks and cone of light. Non erythematous.      Nose: Nose normal.     Mouth/Throat:     Comments: Oral mucosa pink and moist, no tonsillar enlargement or exudate. Posterior pharynx patent and nonerythematous, no uvula deviation or swelling. Normal phonation.  Eyes:     Conjunctiva/sclera: Conjunctivae normal.  Cardiovascular:     Rate and Rhythm: Normal rate.  Pulmonary:     Effort: Pulmonary effort is normal. No respiratory distress.     Comments: Breathing comfortably at rest, CTABL, no wheezing, rales or other adventitious sounds auscultated   Abdominal:     General: There is no distension.  Musculoskeletal:        General: Normal range of motion.     Cervical back: Neck supple.  Skin:    General: Skin is warm and dry.  Neurological:     Mental Status: He is alert and oriented to person, place, and time.     UC Treatments / Results  Labs (all labs ordered are listed, but only abnormal results are displayed) Labs Reviewed - No data to display  EKG   Radiology No results found.  Procedures Procedures (including critical care time)  Medications Ordered in UC Medications - No data to display  Initial Impression / Assessment and Plan / UC Course  I have reviewed the triage vital signs and the nursing notes.  Pertinent labs & imaging results that were available during my care of the patient were reviewed by me and considered in my medical decision making (see chart for details).    Viral URI with cough-Covid test pending from dialyisis ,  exam reassuring, lungs clear to auscultation, suspect viral etiology and recommend symptomatic and supportive care at this  time.  Recommendations provided.  Rest and fluids.  Continue to monitor.  Discussed strict return precautions. Patient verbalized understanding and is agreeable with plan.  Final Clinical Impressions(s) / UC Diagnoses   Final diagnoses:  Viral URI with cough     Discharge Instructions      Continue to rest and fluids Tessalon for daytime cough Robitussin with codeine at bedtime Tylenol as needed for headaches, muscle discomfort from coughing May continue with other over-the-counter medicine as needed for cough/congestion if preferred Please follow-up if not improving or worsening     ED Prescriptions     Medication Sig Dispense Auth. Provider   benzonatate (TESSALON) 200 MG capsule Take 1 capsule (200 mg total) by mouth 3 (three) times daily as needed for up to 7 days for cough. 28 capsule Lannah Koike C, PA-C   guaiFENesin-codeine 100-10 MG/5ML syrup Take 5-10 mLs by mouth 3 (three) times daily as needed for cough. 120 mL Emmett Bracknell, [place] C, PA-C      PDMP not reviewed this encounter.   Janith Lima, PA-C 05/16/21 1011

## 2021-05-15 NOTE — ED Triage Notes (Addendum)
Patient presents to [facility] for evaluation of cough, nasal congestion, fatigue, headache, sore throat since Monday evening.  States the sore throat has since resolved.  Patient was COVID PCR tested at dialysis this morning

## 2021-05-15 NOTE — Discharge Instructions (Addendum)
Continue to rest and fluids Tessalon for daytime cough Robitussin with codeine at bedtime Tylenol as needed for headaches, muscle discomfort from coughing May continue with other over-the-counter medicine as needed for cough/congestion if preferred Please follow-up if not improving or worsening

## 2021-05-23 ENCOUNTER — Other Ambulatory Visit: Payer: Self-pay | Admitting: Internal Medicine

## 2021-05-23 DIAGNOSIS — E1165 Type 2 diabetes mellitus with hyperglycemia: Secondary | ICD-10-CM

## 2021-05-23 NOTE — Telephone Encounter (Signed)
Pt is requesting the Omnipod 5 for an upgrade to what he has currently. Pt would like a call back.     Pt is requesting it sent to [facility],

## 2021-05-23 NOTE — Telephone Encounter (Signed)
Called and spoke with patient he said that he spoke with pharmacist and was told about getting a upgrade. Pt hasn't talk with his insurance company regarding this, I told him that I will send  to this to the PA dept. For a possible PA .Once this has done we can send the order to his pharmacy on his behalf. Informed that we contact him back.  Micheal [name] can you check on PA for the [place] 5 for patient,Thanks

## 2021-05-28 ENCOUNTER — Telehealth: Payer: Self-pay | Admitting: Pharmacy Technician

## 2021-05-28 NOTE — Telephone Encounter (Addendum)
Patient Advocate Encounter   Received notification from [facility] that prior authorization for HUMALOG [facility] is required.   PA submitted on 05/28/2021 Key [id number] Status is cancelled, no PA needed.    [facility] will continue to follow   Ronney Asters, CPhT Patient Advocate [facility] Phone: [phone] Fax:  [phone]

## 2021-06-03 ENCOUNTER — Other Ambulatory Visit (HOSPITAL_COMMUNITY): Payer: Self-pay

## 2021-06-04 ENCOUNTER — Telehealth: Payer: Self-pay

## 2021-06-04 MED ORDER — OMNIPOD 5 DEXG7G6 INTRO GEN 5 KIT: 1.0000 | PACK | 0 refills | Status: DC

## 2021-06-04 MED ORDER — OMNIPOD 5 DEXG7G6 PODS GEN 5 MISC: 1.0000 | 3 refills | Status: DC

## 2021-06-04 NOTE — Telephone Encounter (Signed)
Duplicate

## 2021-06-04 NOTE — Telephone Encounter (Signed)
Can you print script out so they can be sent to [facility].

## 2021-06-04 NOTE — Addendum Note (Signed)
Addended by: Jefferson Fuel on: 06/04/2021 01:09 PM   Modules accepted: Orders

## 2021-06-05 ENCOUNTER — Other Ambulatory Visit: Payer: Self-pay | Admitting: Internal Medicine

## 2021-06-05 MED ORDER — OMNIPOD 5 DEXG7G6 INTRO GEN 5 KIT: 1.0000 | PACK | 0 refills | Status: AC

## 2021-06-05 MED ORDER — OMNIPOD 5 DEXG7G6 PODS GEN 5 MISC: 1.0000 | 3 refills | Status: DC

## 2021-06-06 ENCOUNTER — Other Ambulatory Visit: Payer: Self-pay | Admitting: Internal Medicine

## 2021-06-26 ENCOUNTER — Other Ambulatory Visit: Payer: Self-pay

## 2021-06-26 ENCOUNTER — Encounter: Payer: Medicare Other | Attending: Internal Medicine | Admitting: Nutrition

## 2021-06-26 DIAGNOSIS — E1142 Type 2 diabetes mellitus with diabetic polyneuropathy: Secondary | ICD-10-CM | POA: Insufficient documentation

## 2021-06-26 DIAGNOSIS — Z794 Long term (current) use of insulin: Secondary | ICD-10-CM | POA: Insufficient documentation

## 2021-06-26 NOTE — Patient Instructions (Addendum)
Read manuel Change pod every 3 days and put pump in automated mode after filling pod Call 800 help line if questions

## 2021-06-26 NOTE — Progress Notes (Signed)
Pt. Is here to transfer from his Dash to the OmniPod 5.  He reports that he ran out of dash pods 2 weeks ago, and has been giving himself Humalog q 5 hours.  He is on the Dexcom sensor Settings were transferred from his dash:  Basal rate: 1.2u/hr, ISF: 30, I/C: 1 (pt. Continue to put in units of insulin), target 120 with correction over 120.   He filled a pod with Humalog U200, and attached it to his right abdomen.  His dexcom was linked to his pod, and the pod was linked to [place].  He put the pump in the automated mode. WE discussed how to automated mode works to stop the flow of insulin when his blood sugars drop low, and give more basal insulin when the blood sugars go high.    We reviewed the checklist and the patient signed off as understanding all topics with no final questions.

## 2021-06-29 ENCOUNTER — Telehealth: Payer: Self-pay | Admitting: Nutrition

## 2021-06-29 NOTE — Telephone Encounter (Signed)
Micheal [name] reports that he is having no difficulties using his OmniPod 5 pump.  Says FBS today was 132.  Target blood sugar is 120, but his is just day 4 of his pump usage.  Says blood sugars dropped low 3 hours into his dialysis yesterday, that required treatment.  He was told to pump in stop 2 hours into his dialysis, and then restart it in one hour.  If his blood sugar continue to drop, call the office and we many need to stop the automated mode,and put his pump into a temp basal rate.  He agreed to do this and to call me in one week.  He had no final questions.

## 2021-07-04 ENCOUNTER — Ambulatory Visit (INDEPENDENT_AMBULATORY_CARE_PROVIDER_SITE_OTHER): Payer: Medicare Other | Admitting: Internal Medicine

## 2021-07-04 ENCOUNTER — Other Ambulatory Visit: Payer: Self-pay

## 2021-07-04 VITALS — BP 136/72 | HR 68 | Ht 68.0 in | Wt 238.0 lb

## 2021-07-04 DIAGNOSIS — E1165 Type 2 diabetes mellitus with hyperglycemia: Secondary | ICD-10-CM

## 2021-07-04 DIAGNOSIS — E1142 Type 2 diabetes mellitus with diabetic polyneuropathy: Secondary | ICD-10-CM

## 2021-07-04 DIAGNOSIS — Z992 Dependence on renal dialysis: Secondary | ICD-10-CM

## 2021-07-04 DIAGNOSIS — E113591 Type 2 diabetes mellitus with proliferative diabetic retinopathy without macular edema, right eye: Secondary | ICD-10-CM

## 2021-07-04 DIAGNOSIS — Z794 Long term (current) use of insulin: Secondary | ICD-10-CM

## 2021-07-04 DIAGNOSIS — N186 End stage renal disease: Secondary | ICD-10-CM

## 2021-07-04 DIAGNOSIS — E1122 Type 2 diabetes mellitus with diabetic chronic kidney disease: Secondary | ICD-10-CM | POA: Diagnosis not present

## 2021-07-04 LAB — POCT GLYCOSYLATED HEMOGLOBIN (HGB A1C): Hemoglobin A1C: 7.4 % — AB (ref 4.0–5.6)

## 2021-07-04 NOTE — Patient Instructions (Addendum)
Enter 8 grams for the meals     BASAL SETTINGS: Time  Basal Rate (units/ hr)   0000-1500 1.2  1500- 0000 1.35    BOLUS SETTINGS: Insulin:Carb Ratio: 1 Sensitivity:25     HOW TO TREAT LOW BLOOD SUGARS (Blood sugar LESS THAN 70 MG/DL) Please follow the RULE OF 15 for the treatment of hypoglycemia treatment (when your (blood sugars are less than 70 mg/dL)   STEP 1: Take 15 grams of carbohydrates when your blood sugar is low, which includes:  3-4 GLUCOSE TABS  OR 3-4 OZ OF JUICE OR REGULAR SODA OR ONE TUBE OF GLUCOSE GEL    STEP 2: RECHECK blood sugar in 15 MINUTES STEP 3: If your blood sugar is still low at the 15 minute recheck --> then, go back to STEP 1 and treat AGAIN with another 15 grams of carbohydrates. He is

## 2021-07-04 NOTE — Progress Notes (Signed)
Name: Micheal [name]  Age/ Sex: 65 y.o., male   MRN/ DOB: [id number], Sep 26, 1955     PCP: Maurice Small, MD (Inactive)   Reason for Endocrinology Evaluation: Type 2 Diabetes Mellitus  Initial Endocrine Consultative Visit: 09/28/2018    PATIENT IDENTIFIER: Micheal [name] is a 65 y.o. male with a past medical history of DM, ESRD on HD (started on 03/2016), HTN, OSA. The patient has followed with Endocrinology clinic since 09/28/2018 for consultative assistance with management of his diabetes.  DIABETIC HISTORY:  Micheal [name] was diagnosed with T2DM 1992,has tried Metformin and  Glipizide in the past. Started insulin therapy years ago. His hemoglobin A1c has ranged from 6.7% in 2017, peaking at 12.3 %in 07/2018 .  He was started on the Omnipod in 05/2020  SUBJECTIVE:   During the last visit (07/06/2020): A1c 8.6 %. We adjusted his pump settings    Today (07/04/2021): Micheal [name] is here for a follow up on diabetes .  He checks his blood sugars multiple times daily through Dexcom. The patient has not had hypoglycemic episodes since the last clinic visit.     He currently has the OmniPod 5, it is in auto mode Denies nausea, diarrhea  He is eligible for renal transplant  HOME DIABETES REGIMEN:  Humalog 200/mL   PUMP SETTINGS & DOWNLOAD   BASAL SETTINGS: Time  Basal Rate (units/ hr)   0000 1.2    BOLUS SETTINGS: Insulin:Carb Ratio: 1 Sensitivity:30 Target BG: 100-130 Active Insulin Time: 4 hours   CURRENT PUMP STATISTICS: Average BG: 249 BG Readings:1.3 /day Average Daily Carbs (g): 0 Average Total Daily Insulin: 31.4 Average Daily Basal: 27.4(87%) Average Daily Bolus: 4(13 %)     CONTINUOUS GLUCOSE MONITORING RECORD INTERPRETATION    Dates of Recording:9/30-10/13/2022  Sensor description: dexcom  Results statistics:   CGM use % of time 93  Average and SD 201/65  Time in range  42 %  % Time Above 180 36  % Time above 250 21  % Time Below target <1     Glycemic patterns summary: hyperglycemia at night with worsening hyperglycemia during the day  Hypoglycemic episodes occurred none Overnight periods: stable     DIABETIC COMPLICATIONS: Microvascular complications:  CKD , retinopathy on the right per pt S/P laser, neuropathy based on exam   Last eye exam: Completed 02/02/2020   Macrovascular complications:  CHF  Denies: PVD, CVA  HISTORY:  Past Medical History:  Past Medical History:  Diagnosis Date   Acute respiratory failure with hypoxia ([place])    Anemia    CAP (community acquired pneumonia)    Chronic diastolic heart failure ([place])    Chronic kidney disease    he is s/p AVF but not on HD yet   Diabetes mellitus    Hypertension    Nuclear sclerotic cataract of right eye 12/29/2019   Obesity (BMI 30-39.9)    OSA (obstructive sleep apnea)    severe with AHI 72/hr   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 Wyatt Portela   PERCUTANEOUS CORONARY STENT INTERVENTION (PCI-S)  04/2017   Social History:  reports that he has never smoked. He has never used smokeless tobacco. He reports that he  does not drink alcohol and does not use drugs. Family History:  Family History  Problem Relation Age of Onset   Heart failure Father    Heart disease Father    Heart failure Other      HOME MEDICATIONS: Allergies as of 07/04/2021   No Known Allergies      Medication List        Accurate as of July 04, 2021 11:25 AM. If you have any questions, ask your nurse or doctor.          STOP taking these medications    ofloxacin 0.3 % ophthalmic solution Commonly known as: OCUFLOX Stopped by: Dorita Sciara, MD    Prednisolon-Gatiflox-Bromfenac 1-0.5-0.075 % Susp Stopped by: Dorita Sciara, MD   prednisoLONE acetate 1 % ophthalmic suspension Commonly known as: PRED FORTE Stopped by: Dorita Sciara, MD       TAKE these medications    amLODipine 10 MG tablet Commonly known as: NORVASC Take 10 mg by mouth daily.   aspirin 81 MG EC tablet Take by mouth.   atorvastatin 20 MG tablet Commonly known as: LIPITOR Take by mouth.   B-D UF III MINI PEN NEEDLES 31G X 5 MM Misc Generic drug: Insulin Pen Needle USE FOUR TIMES DAILY AS DIRECTED   calcitRIOL 0.5 MCG capsule Commonly known as: ROCALTROL Take 1 capsule (0.5 mcg total) by mouth every Monday, Wednesday, and Friday with hemodialysis.   calcium acetate 667 MG capsule Commonly known as: PHOSLO Take by mouth 3 (three) times daily with meals.   fluticasone 50 MCG/ACT nasal spray Commonly known as: FLONASE Place 2 sprays into both nostrils daily.   FreeStyle Libre 2 Sensor Misc Use as instructed to check blood sugar daily   glucose blood test strip Commonly known as: Civil engineer, contracting Four times daily   guaiFENesin-codeine 100-10 MG/5ML syrup Take 5-10 mLs by mouth 3 (three) times daily as needed for cough.   HumaLOG KwikPen 200 UNIT/ML KwikPen Generic drug: insulin lispro 50 units daily per pump   hydrALAZINE 25 MG tablet Commonly known as: APRESOLINE Take 1 tablet (25 mg total) by mouth 3 (three) times daily.   insulin aspart 100 UNIT/ML injection Commonly known as: NovoLOG Max daily 100 units through pump   isosorbide dinitrate 30 MG tablet Commonly known as: ISORDIL Take 1 tablet (30 mg total) by mouth 3 (three) times daily. Please call and schedule office visit for further refills 1st attempt   metoprolol 200 MG 24 hr tablet Commonly known as: TOPROL-XL Take 1 tablet (200 mg total) by mouth daily. Please call and schedule office visit for further refills 1st attempt   nitroGLYCERIN 0.4 MG SL  tablet Commonly known as: NITROSTAT Place under the tongue.   omega-3 acid ethyl esters 1 g capsule Commonly known as: LOVAZA Take 1 g by mouth daily.   omeprazole 20 MG capsule Commonly known as: PRILOSEC Take 20 mg by mouth daily as needed.   Omnipod 5 G6 Intro (Gen 5) Kit 1 Device by Does not apply route every other day.   Omnipod 5 G6 Pod (Gen 5) Misc 1 Device by Does not apply route every other day.   Pfizer-BioNTech COVID-19 Vacc 30 MCG/0.3ML injection Generic drug: COVID-19 mRNA vaccine (Pfizer) INJECT AS DIRECTED   sertraline 50 MG tablet Commonly known as: ZOLOFT Take 0.5 tablets by mouth daily.   Transparent I.V. Site Dressing Misc 1 Device by Does not apply route as directed.  OBJECTIVE:    Vital Signs: BP 136/72 (BP Location: Right Arm, Patient Position: Sitting, Cuff Size: Large)   Pulse 68   Ht '5\' 8"'  (1.727 m)   Wt 238 lb (108 kg)   SpO2 99%   BMI 36.19 kg/m    General: Pt appears well and is in NAD  Lungs: Clear with good BS bilat with no rales, rhonchi, or wheezes  Heart: RRR with normal S1 and S2 and no gallops; no murmurs; no rub  Abdomen: soft, nontender, RLQ lipohypertrophic area  Extremities: No pretibial edema.  Neuro: MS is good with appropriate affect, pt is alert and Ox3       DM foot exam: 07/04/2021 The skin of the feet is intact without sores or ulcerations. The pedal pulses are 2+ on right and 2+ on left. The sensation is intact  to a screening 5.07, 10 gram monofilament on the left         DATA REVIEWED: 06/26/2021 Creatinine 11.21 Calcium 9.1 Phosphorus 5.7   ASSESSMENT / PLAN / RECOMMENDATIONS:   1) Type 2 Diabetes Mellitus, Sub-Optimally controlled, With Renal complications (ESRD on HD), neuropathic and retinal complication - Most recent A1c of 7.4 %. Goal A1c < 7.0 %.     - His Aq1c is skewed given end-stage renal disease on HD, his A1c Per Dexcom download is at 8.1% - He has been depending on the basal  rate and correction factor, he has not been bolusing for any meals. -I have encouraged him to enter 8 g of carbohydrate per meal from now on -I have made some adjustment to his basal rate and SF   MEDICATIONS: Humalog 200 u/mL     BASAL SETTINGS: Time  Basal Rate (units/ hr)   0000-1500 1.2  1500- 0000 1.35    BOLUS SETTINGS: Insulin:Carb Ratio: 1 Sensitivity:25    EDUCATION / INSTRUCTIONS: BG monitoring instructions: Patient is instructed to check his blood sugars 4 times a day, before each meal and bedtime. Call [facility] if: BG persistently < 70 I reviewed the Rule of 15 for the treatment of hypoglycemia in detail with the patient. Literature supplied.    2) Diabetic complications:  Eye: Does have known diabetic retinopathy.  Neuro/ Feet: Does have known diabetic peripheral neuropathy based on foot exam on 10/27/2019 Renal: Patient does  have known ESRD, patient is on HD   F/U in 6 months    Signed electronically by: Mack Guise, MD  [facility]  [address] [place], [place] [address] Phone: [phone] FAX: [phone]   CC: Maurice Small, MD (Inactive) [address] [place] [address] Phone: [phone]  Fax: [phone]  Return to Endocrinology clinic as below: Future Appointments  Date Time Provider [place]  10/22/2021 10:45 AM Rankin, Clent Demark, MD RDE-RDE None

## 2021-07-09 ENCOUNTER — Telehealth: Payer: Self-pay | Admitting: Nutrition

## 2021-07-09 NOTE — Telephone Encounter (Signed)
LVM to call me to schedule return visit for pump education if needed.  Telephone number given

## 2021-08-11 ENCOUNTER — Other Ambulatory Visit: Payer: Self-pay | Admitting: Internal Medicine

## 2021-10-10 ENCOUNTER — Other Ambulatory Visit: Payer: Self-pay

## 2021-10-10 ENCOUNTER — Encounter (INDEPENDENT_AMBULATORY_CARE_PROVIDER_SITE_OTHER): Payer: Medicare Other | Admitting: Ophthalmology

## 2021-10-10 MED ORDER — OMNIPOD 5 DEXG7G6 PODS GEN 5 MISC: 1.0000 | 3 refills | Status: AC

## 2021-10-10 NOTE — Telephone Encounter (Signed)
Script sent  

## 2021-10-22 ENCOUNTER — Encounter (INDEPENDENT_AMBULATORY_CARE_PROVIDER_SITE_OTHER): Payer: Medicare Other | Admitting: Ophthalmology

## 2021-10-24 ENCOUNTER — Other Ambulatory Visit: Payer: Self-pay | Admitting: Internal Medicine

## 2021-11-12 ENCOUNTER — Encounter (INDEPENDENT_AMBULATORY_CARE_PROVIDER_SITE_OTHER): Payer: Medicare Other | Admitting: Ophthalmology

## 2021-11-26 ENCOUNTER — Ambulatory Visit (INDEPENDENT_AMBULATORY_CARE_PROVIDER_SITE_OTHER): Payer: Medicare Other | Admitting: Ophthalmology

## 2021-11-26 ENCOUNTER — Encounter (INDEPENDENT_AMBULATORY_CARE_PROVIDER_SITE_OTHER): Payer: Self-pay | Admitting: Ophthalmology

## 2021-11-26 ENCOUNTER — Other Ambulatory Visit: Payer: Self-pay

## 2021-11-26 DIAGNOSIS — H2512 Age-related nuclear cataract, left eye: Secondary | ICD-10-CM

## 2021-11-26 DIAGNOSIS — E113592 Type 2 diabetes mellitus with proliferative diabetic retinopathy without macular edema, left eye: Secondary | ICD-10-CM

## 2021-11-26 DIAGNOSIS — Z794 Long term (current) use of insulin: Secondary | ICD-10-CM

## 2021-11-26 DIAGNOSIS — E113591 Type 2 diabetes mellitus with proliferative diabetic retinopathy without macular edema, right eye: Secondary | ICD-10-CM

## 2021-11-26 DIAGNOSIS — E113551 Type 2 diabetes mellitus with stable proliferative diabetic retinopathy, right eye: Secondary | ICD-10-CM | POA: Diagnosis not present

## 2021-11-26 DIAGNOSIS — G4733 Obstructive sleep apnea (adult) (pediatric): Secondary | ICD-10-CM

## 2021-11-26 NOTE — Assessment & Plan Note (Signed)
Quiescent PDR OD, ?

## 2021-11-26 NOTE — Assessment & Plan Note (Signed)
OS, moderate scatter PRP is holding PDR from progression.  No signs of worsening.  No residual macular edema. ?

## 2021-11-26 NOTE — Progress Notes (Signed)
11/26/2021     CHIEF COMPLAINT Patient presents for  Chief Complaint  Patient presents with   Diabetic Retinopathy without Macular Edema      HISTORY OF PRESENT ILLNESS: Micheal [name] is a 66 y.o. male who presents to the clinic today for:   HPI   6 mos fu ou oct fp. Patient states vision is stable and unchanged since last visit. Denies any new floaters or FOL.   Last edited by Laurin Coder on 11/26/2021  9:51 AM.      Referring physician: Debbra Riding, MD [address] [place],  [place] [address]  HISTORICAL INFORMATION:   Selected notes from the MEDICAL RECORD NUMBER    Lab Results  Component Value Date   HGBA1C 7.4 (A) 07/04/2021     CURRENT MEDICATIONS: No current outpatient medications on file. (Ophthalmic Drugs)   No current facility-administered medications for this visit. (Ophthalmic Drugs)   Current Outpatient Medications (Other)  Medication Sig   amLODipine (NORVASC) 10 MG tablet Take 10 mg by mouth daily.   aspirin 81 MG EC tablet Take by mouth.   atorvastatin (LIPITOR) 20 MG tablet Take by mouth.   calcitRIOL (ROCALTROL) 0.5 MCG capsule Take 1 capsule (0.5 mcg total) by mouth every Monday, Wednesday, and Friday with hemodialysis.   calcium acetate (PHOSLO) 667 MG capsule Take by mouth 3 (three) times daily with meals.   Continuous Blood Gluc Sensor (FREESTYLE LIBRE 2 SENSOR) MISC Use as instructed to check blood sugar daily (Patient not taking: Reported on 07/04/2021)   fluticasone (FLONASE) 50 MCG/ACT nasal spray Place 2 sprays into both nostrils daily.   glucose blood (ONETOUCH VERIO) test strip Four times daily   guaiFENesin-codeine 100-10 MG/5ML syrup Take 5-10 mLs by mouth 3 (three) times daily as needed for cough.   HUMALOG KWIKPEN 200 UNIT/ML KwikPen 50 UNITS DAILY PER PUMP   hydrALAZINE (APRESOLINE) 25 MG tablet Take 1 tablet (25 mg total) by mouth 3 (three) times daily.   insulin aspart (NOVOLOG) 100 UNIT/ML injection Max  daily 100 units through pump   Insulin Disposable Pump (OMNIPOD 5 G6 INTRO, GEN 5,) KIT 1 Device by Does not apply route every other day.   Insulin Disposable Pump (OMNIPOD 5 G6 POD, GEN 5,) MISC 1 Device by Does not apply route every other day.   Insulin Pen Needle (B-D UF III MINI PEN NEEDLES) 31G X 5 MM MISC USE FOUR TIMES DAILY AS DIRECTED   isosorbide dinitrate (ISORDIL) 30 MG tablet Take 1 tablet (30 mg total) by mouth 3 (three) times daily. Please call and schedule office visit for further refills 1st attempt   metoprolol (TOPROL-XL) 200 MG 24 hr tablet Take 1 tablet (200 mg total) by mouth daily. Please call and schedule office visit for further refills 1st attempt   nitroGLYCERIN (NITROSTAT) 0.4 MG SL tablet Place under the tongue.   omega-3 acid ethyl esters (LOVAZA) 1 G capsule Take 1 g by mouth daily.   omeprazole (PRILOSEC) 20 MG capsule Take 20 mg by mouth daily as needed.   sertraline (ZOLOFT) 50 MG tablet Take 0.5 tablets by mouth daily.    Transparent Dressings (TRANSPARENT I.V. SITE DRESSING) MISC 1 Device by Does not apply route as directed.   No current facility-administered medications for this visit. 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 Wyatt Portela   PERCUTANEOUS CORONARY STENT INTERVENTION (PCI-S)  04/2017    FAMILY HISTORY Family History  Problem Relation Age of Onset   Heart failure Father    Heart disease Father    Heart failure Other     SOCIAL HISTORY Social History   Tobacco Use   Smoking status: Never   Smokeless tobacco: Never  Substance Use Topics   Alcohol use: No    Alcohol/week: 0.0 standard drinks   Drug use: No         OPHTHALMIC EXAM:  Base Eye Exam     Visual Acuity (ETDRS)       Right Left   Dist [place] 20/40 20/25 +2   Dist ph [place] 20/30 -2          Tonometry (Tonopen, 9:51 AM)       Right Left   Pressure 18 17         Pupils       Pupils Dark Light APD   Right PERRL 4 3 None   Left PERRL 4 3 None         Visual Fields (Counting fingers)       Left Right    Full Full         Extraocular Movement       Right Left    Full Full         Neuro/Psych     Oriented x3: Yes   Mood/Affect: Normal         Dilation     Both eyes: 1.0% Mydriacyl, 2.5% Phenylephrine @ 9:51 AM           Slit Lamp and Fundus Exam     External Exam       Right Left   External Normal Normal         Slit Lamp Exam       Right Left   Lids/Lashes Normal Normal   Conjunctiva/Sclera White and quiet White and quiet   Cornea Clear Clear   Anterior Chamber Deep and quiet Deep and quiet   Iris Round and reactive Round and reactive   Lens Posterior chamber intraocular lens 2+ Nuclear sclerosis   Anterior Vitreous Normal Normal         Fundus Exam       Right Left   Posterior Vitreous Vitrectomized, clear Normal   Disc old nvd, cauterized Normal   C/D Ratio 0.5 0.5   Macula Normal Normal   Vessels PDR-quiet  PDR-quiet   Periphery Good PRP 360 Nasal and temporal PRP, no new NV            IMAGING AND PROCEDURES  Imaging and Procedures for 11/26/21  OCT,  Retina - OU - Both Eyes       Right Eye Quality was good. Scan locations  included subfoveal. Central Foveal Thickness: 296. Progression has been stable. Findings include abnormal foveal contour.   Left Eye Quality was good. Scan locations included subfoveal. Central Foveal Thickness: 278. Progression has been stable. Findings include vitreomacular adhesion .   Notes Less CME OD No active CSME OS      Color Fundus Photography Optos - OU - Both Eyes       Right Eye Progression has improved. Disc findings include normal observations. Macula : normal observations.   Left Eye Progression has improved. Disc findings include normal observations. Macula : normal observations.   Notes Bilateral proliferative diabetic retinopathy, now quiescent OD status post vitrectomy and removal of neovascularization of the disc and elsewhere.    Clear media OU, stable Good PRP OS, no active NVE             ASSESSMENT/PLAN:  Nuclear sclerotic cataract of left eye Slight progression follow-up Dr. Wyatt Portela as scheduled  Stable treated proliferative diabetic retinopathy of right eye determined by examination associated with type 2 diabetes mellitus ([place]) Quiescent PDR OD,  Type 2 diabetes mellitus with proliferative diabetic retinopathy of left eye without macular edema (HCC) OS, moderate scatter PRP is holding PDR from progression.  No signs of worsening.  No residual macular edema.  OSA (obstructive sleep apnea) Still noncompliant because and a little sleep     ICD-10-CM   1. Type 2 diabetes mellitus with left eye affected by proliferative retinopathy without macular edema, without long-term current use of insulin (HCC)  E11.3592 OCT, Retina - OU - Both Eyes    Color Fundus Photography Optos - OU - Both Eyes    2. Type 2 diabetes mellitus with right eye affected by proliferative retinopathy without macular edema, with long-term current use of insulin (HCC)  E11.3591 OCT, Retina - OU - Both Eyes   Z79.4 Color Fundus Photography Optos - OU - Both Eyes    3.  Nuclear sclerotic cataract of left eye  H25.12     4. Stable treated proliferative diabetic retinopathy of right eye determined by examination associated with type 2 diabetes mellitus ([place])  E11.3551     5. OSA (obstructive sleep apnea)  G47.33       1.  PDR OU currently stable no active macular edema  2.  OS with cataract, follow-up Dr. Katy Fitch  3.  OSA unable to use CPAP, patient treated by the notion of the inspire implant  Dr. Jerrell Belfast is a doctor he can see here in [facility] who is doing this  Ophthalmic Meds Ordered this visit:  No orders of the defined types were placed in this encounter.      Return in about 7 months (around 06/28/2022) for DILATE OU, COLOR FP, OCT.  There are no Patient Instructions on file for this visit.   Explained the diagnoses, plan, and follow up with the patient and they expressed understanding.  Patient expressed understanding of the importance of proper follow up care.   Clent Demark Livan Hires M.D. Diseases & Surgery of the Retina and Vitreous Retina & Diabetic [place] 11/26/21     Abbreviations: M myopia (nearsighted); A astigmatism; H hyperopia (farsighted); P presbyopia; Mrx spectacle prescription;  CTL contact lenses; OD right eye; OS left eye; OU both eyes  XT exotropia; ET esotropia; PEK punctate epithelial keratitis; PEE punctate epithelial erosions; DES dry eye syndrome; MGD meibomian  gland dysfunction; ATs artificial tears; PFAT's preservative free artificial tears; [place] nuclear sclerotic cataract; PSC posterior subcapsular cataract; ERM epi-retinal membrane; PVD posterior vitreous detachment; RD retinal detachment; DM diabetes mellitus; DR diabetic retinopathy; NPDR non-proliferative diabetic retinopathy; PDR proliferative diabetic retinopathy; CSME clinically significant macular edema; DME diabetic macular edema; dbh dot blot hemorrhages; CWS cotton wool spot; POAG primary open angle glaucoma; C/D cup-to-disc ratio; HVF humphrey  visual field; GVF goldmann visual field; OCT optical coherence tomography; IOP intraocular pressure; BRVO Branch retinal vein occlusion; CRVO central retinal vein occlusion; CRAO central retinal artery occlusion; BRAO branch retinal artery occlusion; RT retinal tear; SB scleral buckle; PPV pars plana vitrectomy; VH Vitreous hemorrhage; PRP panretinal laser photocoagulation; IVK intravitreal kenalog; VMT vitreomacular traction; MH Macular hole;  NVD neovascularization of the disc; NVE neovascularization elsewhere; AREDS age related eye disease study; ARMD age related macular degeneration; POAG primary open angle glaucoma; EBMD epithelial/anterior basement membrane dystrophy; ACIOL anterior chamber intraocular lens; IOL intraocular lens; PCIOL posterior chamber intraocular lens; Phaco/IOL phacoemulsification with intraocular lens placement; [place] photorefractive keratectomy; LASIK laser assisted in situ keratomileusis; HTN hypertension; DM diabetes mellitus; COPD chronic obstructive pulmonary disease

## 2021-11-26 NOTE — Assessment & Plan Note (Signed)
Still noncompliant because and a little sleep ?

## 2021-11-26 NOTE — Assessment & Plan Note (Signed)
Slight progression follow-up Dr. Wyatt Portela as scheduled ?

## 2021-11-26 NOTE — Patient Instructions (Signed)
Call Dr. Jerrell Belfast in [place], ear nose and throat specialist to evaluate the INSPIRE implant being implanted for t those who are not able to use CPAP ?

## 2022-01-02 ENCOUNTER — Ambulatory Visit: Payer: Medicare Other | Admitting: Internal Medicine

## 2022-01-02 NOTE — Progress Notes (Deleted)
? ?Name: Micheal [name]  ?Age/ Sex: 66 y.o., male   ?MRN/ DOB: [id number], [birth year]    ? ?PCP: Maurice Small, MD   ?Reason for Endocrinology Evaluation: Type 2 Diabetes Mellitus  ?Initial Endocrine Consultative Visit: 09/28/2018  ? ? ?PATIENT IDENTIFIER: Micheal [name] is a 66 y.o. male with a past medical history of DM, ESRD on HD (started on 03/2016), HTN, OSA. The patient has followed with Endocrinology clinic since 09/28/2018 for consultative assistance with management of his diabetes. ? ?DIABETIC HISTORY:  ?[name] was diagnosed with T2DM 1992,has tried Metformin and  Glipizide in the past. Started insulin therapy years ago. His hemoglobin A1c has ranged from 6.7% in 2017, peaking at 12.3 %in 07/2018 . ? ?He was started on the [place] in 05/2020 ? ?SUBJECTIVE:  ? ?During the last visit (07/04/2021): A1c 7.4 %. We adjusted his pump settings ? ? ? ?Today (01/02/2022): Mr. Hurn is here for a follow up on diabetes .  He checks his blood sugars multiple times daily through Dexcom. The patient has not had hypoglycemic episodes since the last clinic visit.   ? ? ?He currently has the OmniPod 5, it is in auto mode ?Denies nausea, diarrhea  ?He is eligible for renal transplant ? ?HOME DIABETES REGIMEN:  ?Humalog 200/mL  ? ?PUMP SETTINGS & DOWNLOAD ?  ?BASAL SETTINGS: ?Time  Basal Rate (units/ hr)   ?0000 1.2  ? ? ?BOLUS SETTINGS: ?Insulin:Carb Ratio: 1 ?Sensitivity:30 ?Target BG: 100-130 ?Active Insulin Time: 4 hours ? ? ?CURRENT PUMP STATISTICS: ?Average BG: 249 ?BG Readings:1.3 /day ?Average Daily Carbs (g): 0 ?Average Total Daily Insulin: 31.4 ?Average Daily Basal: 27.4(87%) ?Average Daily Bolus: 4(13 %) ? ? ? ? ?CONTINUOUS GLUCOSE MONITORING RECORD INTERPRETATION   ? ?Dates of Recording:9/30-10/13/2022 ? ?Sensor description: dexcom ? ?Results statistics: ?  ?CGM use % of time 93  ?Average and SD 201/65  ?Time in range  42 %  ?% Time Above 180 36  ?% Time above 250 21  ?% Time Below target <1  ? ? ?Glycemic  patterns summary: hyperglycemia at night with worsening hyperglycemia during the day  ?Hypoglycemic episodes occurred none ?Overnight periods: stable  ? ? ? ?DIABETIC COMPLICATIONS: ?Microvascular complications:  ?ESRD on HD, proliferative retinopathy b/L ,  neuropathy based on exam  ?Last eye exam: Completed 11/26/2021 ?  ?Macrovascular complications:  ?CHF  ?Denies: PVD, CVA ? ?HISTORY:  ?Past Medical History:  ?Past Medical History:  ?Diagnosis Date  ? Acute respiratory failure with hypoxia ([place])   ? Anemia   ? CAP (community acquired pneumonia)   ? Chronic diastolic heart failure ([place])   ? Chronic kidney disease   ? he is s/p AVF but not on HD yet  ? Diabetes mellitus   ? Hypertension   ? Nuclear sclerotic cataract of right eye 12/29/2019  ? Obesity (BMI 30-39.9)   ? OSA (obstructive sleep apnea)   ? severe with AHI 72/hr  ? 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 Vitreous hemorrhage of right eye ([place]) 12/29/2019  ? ?Past Surgical History:  ?Past Surgical History:  ?Procedure Laterality Date  ? AV FISTULA PLACEMENT    ? CATARACT EXTRACTION Right 01/27/2020  ? Dr. Wyatt Portela  ? PERCUTANEOUS CORONARY STENT INTERVENTION (PCI-S)  04/2017  ? ?Social History:  reports that he has never smoked. He has never used smokeless tobacco. He reports that he does not drink alcohol  and does not use drugs. ?Family History:  ?Family History  ?Problem Relation Age of Onset  ? Heart failure Father   ? Heart disease Father   ? Heart failure Other   ? ? ? ?HOME MEDICATIONS: ?Allergies as of 01/02/2022   ?No Known Allergies ?  ? ?  ?Medication List  ?  ? ?  ? Accurate as of January 02, 2022  7:27 AM. If you have any questions, ask your nurse or doctor.  ?  ?  ? ?  ? ?amLODipine 10 MG tablet ?Commonly known as: NORVASC ?Take 10 mg by mouth daily. ?  ?aspirin 81 MG EC tablet ?Take by mouth. ?  ?atorvastatin 20 MG tablet ?Commonly known as: LIPITOR ?Take by  mouth. ?  ?B-D UF III MINI PEN NEEDLES 31G X 5 MM Misc ?Generic drug: Insulin Pen Needle ?USE FOUR TIMES DAILY AS DIRECTED ?  ?calcitRIOL 0.5 MCG capsule ?Commonly known as: ROCALTROL ?Take 1 capsule (0.5 mcg total) by mouth every Monday, Wednesday, and Friday with hemodialysis. ?  ?calcium acetate 667 MG capsule ?Commonly known as: PHOSLO ?Take by mouth 3 (three) times daily with meals. ?  ?fluticasone 50 MCG/ACT nasal spray ?Commonly known as: FLONASE ?Place 2 sprays into both nostrils daily. ?  ?FreeStyle Libre 2 Sensor Misc ?Use as instructed to check blood sugar daily ?  ?glucose blood test strip ?Commonly known as: OneTouch Verio ?Four times daily ?  ?guaiFENesin-codeine 100-10 MG/5ML syrup ?Take 5-10 mLs by mouth 3 (three) times daily as needed for cough. ?  ?HumaLOG KwikPen 200 UNIT/ML KwikPen ?Generic drug: insulin lispro ?50 UNITS DAILY PER PUMP ?  ?hydrALAZINE 25 MG tablet ?Commonly known as: APRESOLINE ?Take 1 tablet (25 mg total) by mouth 3 (three) times daily. ?  ?insulin aspart 100 UNIT/ML injection ?Commonly known as: NovoLOG ?Max daily 100 units through pump ?  ?isosorbide dinitrate 30 MG tablet ?Commonly known as: ISORDIL ?Take 1 tablet (30 mg total) by mouth 3 (three) times daily. Please call and schedule office visit for further refills 1st attempt ?  ?metoprolol 200 MG 24 hr tablet ?Commonly known as: TOPROL-XL ?Take 1 tablet (200 mg total) by mouth daily. Please call and schedule office visit for further refills 1st attempt ?  ?nitroGLYCERIN 0.4 MG SL tablet ?Commonly known as: NITROSTAT ?Place under the tongue. ?  ?omega-3 acid ethyl esters 1 g capsule ?Commonly known as: LOVAZA ?Take 1 g by mouth daily. ?  ?omeprazole 20 MG capsule ?Commonly known as: PRILOSEC ?Take 20 mg by mouth daily as needed. ?  ?Omnipod 5 G6 Intro (Gen 5) Kit ?1 Device by Does not apply route every other day. ?  ?Omnipod 5 G6 Pod (Gen 5) Misc ?1 Device by Does not apply route every other day. ?  ?sertraline 50 MG  tablet ?Commonly known as: ZOLOFT ?Take 0.5 tablets by mouth daily. ?  ?Transparent I.V. Site Dressing Misc ?1 Device by Does not apply route as directed. ?  ? ?  ? ?  ?OBJECTIVE:  ?  ?Vital Signs: There were no vitals taken for this visit. ? ? ?General: Pt appears well and is in NAD  ?Lungs: Clear with good BS bilat with no rales, rhonchi, or wheezes  ?Heart: RRR with normal S1 and S2 and no gallops; no murmurs; no rub  ?Abdomen: soft, nontender, RLQ lipohypertrophic area  ?Extremities: No pretibial edema.  ?Neuro: MS is good with appropriate affect, pt is alert and Ox3  ?  ?   ?DM  foot exam: 07/04/2021 ?The skin of the feet is intact without sores or ulcerations. ?The pedal pulses are 2+ on right and 2+ on left. ?The sensation is intact  to a screening 5.07, 10 gram monofilament on the left  ?  ?  ? ? ? ?DATA REVIEWED: ?12/10/2021 ?A1c 7.9% ?TG 188 ?HDL 27 ?LDL 78 ?TSH 2.36 ? ? ? ? ?ASSESSMENT / PLAN / RECOMMENDATIONS:  ? ?1) Type 2 Diabetes Mellitus, Sub-Optimally controlled, With Renal complications (ESRD on HD), neuropathic and retinal complication - Most recent A1c of 7.4 %. Goal A1c < 7.0 %.   ? ? ?- His Aq1c is skewed given end-stage renal disease on HD, his A1c Per Dexcom download is at 8.1% ?- He has been depending on the basal rate and correction factor, he has not been bolusing for any meals. ?-I have encouraged him to enter 8 g of carbohydrate per meal from now on ?-I have made some adjustment to his basal rate and SF ? ? ?MEDICATIONS: ?Humalog 200 u/mL  ? ? ? ?BASAL SETTINGS: ?Time  Basal Rate (units/ hr)   ?0000-1500 1.2  ?1500- 0000 1.35  ? ? ?BOLUS SETTINGS: ?Insulin:Carb Ratio: 1 ?Sensitivity:25 ? ? ? ?EDUCATION / INSTRUCTIONS: ?BG monitoring instructions: Patient is instructed to check his blood sugars 4 times a day, before each meal and bedtime. ?Call [facility] if: BG persistently < 70 ?I reviewed the Rule of 15 for the treatment of hypoglycemia in detail with the patient.  Literature supplied. ?  ? ?2) Diabetic complications:  ?Eye: Does have known diabetic retinopathy.  ?Neuro/ Feet: Does have known diabetic peripheral neuropathy based on foot exam on 10/27/2019 ?Renal: Patient does  ha

## 2022-01-27 ENCOUNTER — Telehealth: Payer: Self-pay

## 2022-01-27 NOTE — Telephone Encounter (Signed)
LMTCB. Per Dr. Kelton Pillar patient needs to stay on the G6 because [place] will not talk to the insulin pump ?

## 2022-02-14 ENCOUNTER — Other Ambulatory Visit: Payer: Self-pay | Admitting: Urgent Care

## 2022-02-14 ENCOUNTER — Ambulatory Visit (HOSPITAL_BASED_OUTPATIENT_CLINIC_OR_DEPARTMENT_OTHER)
Admission: RE | Admit: 2022-02-14 | Discharge: 2022-02-14 | Disposition: A | Discharge status: Home / Self Care | Payer: Medicare Other | Source: Ambulatory Visit | Attending: Urgent Care | Admitting: Urgent Care

## 2022-02-14 ENCOUNTER — Ambulatory Visit
Admission: RE | Admit: 2022-02-14 | Discharge: 2022-02-14 | Disposition: A | Discharge status: Home / Self Care | Payer: BC Managed Care – PPO | Source: Ambulatory Visit | Attending: Urgent Care | Admitting: Urgent Care

## 2022-02-14 VITALS — BP 143/78 | HR 77 | Temp 98.4°F | Resp 20

## 2022-02-14 DIAGNOSIS — I5032 Chronic diastolic (congestive) heart failure: Secondary | ICD-10-CM

## 2022-02-14 DIAGNOSIS — J019 Acute sinusitis, unspecified: Secondary | ICD-10-CM

## 2022-02-14 DIAGNOSIS — R062 Wheezing: Secondary | ICD-10-CM | POA: Diagnosis not present

## 2022-02-14 DIAGNOSIS — R059 Cough, unspecified: Secondary | ICD-10-CM | POA: Insufficient documentation

## 2022-02-14 DIAGNOSIS — N186 End stage renal disease: Secondary | ICD-10-CM | POA: Diagnosis not present

## 2022-02-14 DIAGNOSIS — Z8709 Personal history of other diseases of the respiratory system: Secondary | ICD-10-CM

## 2022-02-14 DIAGNOSIS — R053 Chronic cough: Secondary | ICD-10-CM

## 2022-02-14 DIAGNOSIS — E119 Type 2 diabetes mellitus without complications: Secondary | ICD-10-CM

## 2022-02-14 DIAGNOSIS — Z794 Long term (current) use of insulin: Secondary | ICD-10-CM

## 2022-02-14 DIAGNOSIS — Z992 Dependence on renal dialysis: Secondary | ICD-10-CM

## 2022-02-14 MED ORDER — DOXYCYCLINE HYCLATE 100 MG PO CAPS: 100.0000 mg | ORAL_CAPSULE | Freq: Two times a day (BID) | ORAL | 0 refills | Status: AC

## 2022-02-14 MED ORDER — BENZONATATE 100 MG PO CAPS: 100.0000 mg | ORAL_CAPSULE | Freq: Three times a day (TID) | ORAL | 0 refills | Status: AC | PRN

## 2022-02-14 MED ORDER — PROMETHAZINE-DM 6.25-15 MG/5ML PO SYRP: 5.0000 mL | ORAL_SOLUTION | Freq: Every evening | ORAL | 0 refills | Status: AC | PRN

## 2022-02-14 NOTE — ED Triage Notes (Signed)
Pt c/o cough that began 2 weeks ago.

## 2022-02-14 NOTE — Progress Notes (Signed)
DG Chest 2 View  Result Date: 02/14/2022 CLINICAL DATA:  Cough EXAM: CHEST - 2 VIEW COMPARISON:  Chest x-ray dated April 14, 2016 FINDINGS: The heart size and mediastinal contours are within normal limits. Both lungs are clear. The visualized skeletal structures are unremarkable. IMPRESSION: No active cardiopulmonary disease. Electronically Signed   By: Yetta Glassman M.D.   On: 02/14/2022 15:58     We will treat for an acute sinus infection.  Recommend he start doxycycline and use cough suppression medications as necessary which is safe with his ESRD.

## 2022-02-14 NOTE — Discharge Instructions (Signed)
I have placed orders to have an x-ray done at the med center in [place].  Please had there now.  I will call you with your results and update our treatment plan if necessary after I get the report. 

## 2022-02-14 NOTE — ED Provider Notes (Signed)
[facility]   MRN: [id number] DOB: Jan 22, 1956  Subjective:   Micheal [name] is a 66 y.o. male presenting for 2-week history of persistent coughing, productive cough, coughing fits.  No active chest pain or shortness of breath.  Patient does have a significant history of acute pulmonary edema, chronic diastolic heart failure, pneumonia, ESRD on dialysis.  He is a type II diabetic on insulin.  No smoking, asthma, history of COPD.  He did have 1 sick contact with his wife who was seen yesterday.  No current facility-administered medications for this encounter.  Current Outpatient Medications:    amLODipine (NORVASC) 10 MG tablet, Take 10 mg by mouth daily., Disp: , Rfl:    aspirin 81 MG EC tablet, Take by mouth., Disp: , Rfl:    atorvastatin (LIPITOR) 20 MG tablet, Take by mouth., Disp: , Rfl:    calcitRIOL (ROCALTROL) 0.5 MCG capsule, Take 1 capsule (0.5 mcg total) by mouth every Monday, Wednesday, and Friday with hemodialysis., Disp: 30 capsule, Rfl: 1   calcium acetate (PHOSLO) 667 MG capsule, Take by mouth 3 (three) times daily with meals., Disp: , Rfl:    Continuous Blood Gluc Sensor (FREESTYLE LIBRE 2 SENSOR) MISC, Use as instructed to check blood sugar daily (Patient not taking: Reported on 07/04/2021), Disp: 6 each, Rfl: 2   fluticasone (FLONASE) 50 MCG/ACT nasal spray, Place 2 sprays into both nostrils daily., Disp: , Rfl: 5   glucose blood (ONETOUCH VERIO) test strip, Four times daily, Disp: 150 each, Rfl: 12   guaiFENesin-codeine 100-10 MG/5ML syrup, Take 5-10 mLs by mouth 3 (three) times daily as needed for cough., Disp: 120 mL, Rfl: 0   HUMALOG KWIKPEN 200 UNIT/ML KwikPen, 50 UNITS DAILY PER PUMP, Disp: 30 mL, Rfl: 1   hydrALAZINE (APRESOLINE) 25 MG tablet, Take 1 tablet (25 mg total) by mouth 3 (three) times daily., Disp: 90 tablet, Rfl: 1   insulin aspart (NOVOLOG) 100 UNIT/ML injection, Max daily 100 units through pump, Disp: 90 mL, Rfl: 3   Insulin  Disposable Pump (OMNIPOD 5 G6 INTRO, GEN 5,) KIT, 1 Device by Does not apply route every other day., Disp: 1 kit, Rfl: 0   Insulin Disposable Pump (OMNIPOD 5 G6 POD, GEN 5,) MISC, 1 Device by Does not apply route every other day., Disp: 9 each, Rfl: 3   Insulin Pen Needle (B-D UF III MINI PEN NEEDLES) 31G X 5 MM MISC, USE FOUR TIMES DAILY AS DIRECTED, Disp: 200 each, Rfl: 11   isosorbide dinitrate (ISORDIL) 30 MG tablet, Take 1 tablet (30 mg total) by mouth 3 (three) times daily. Please call and schedule office visit for further refills 1st attempt, Disp: 90 tablet, Rfl: 0   metoprolol (TOPROL-XL) 200 MG 24 hr tablet, Take 1 tablet (200 mg total) by mouth daily. Please call and schedule office visit for further refills 1st attempt, Disp: 30 tablet, Rfl: 0   nitroGLYCERIN (NITROSTAT) 0.4 MG SL tablet, Place under the tongue., Disp: , Rfl:    omega-3 acid ethyl esters (LOVAZA) 1 G capsule, Take 1 g by mouth daily., Disp: , Rfl:    omeprazole (PRILOSEC) 20 MG capsule, Take 20 mg by mouth daily as needed., Disp: , Rfl:    sertraline (ZOLOFT) 50 MG tablet, Take 0.5 tablets by mouth daily. , Disp: , Rfl: 0   Transparent Dressings (TRANSPARENT I.V. 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 Wyatt Portela   PERCUTANEOUS CORONARY STENT INTERVENTION (PCI-S)  04/2017    Family History  Problem Relation Age of Onset   Heart failure Father    Heart disease Father    Heart failure Other     Social History   Tobacco Use   Smoking status: Never   Smokeless tobacco: Never  Substance Use Topics   Alcohol use: No    Alcohol/week: 0.0 standard drinks   Drug use: No    ROS   Objective:   Vitals: BP (!) 143/78 (BP Location: Right Arm) Comment: left arm restriction, patient is a dialysis patient Comment (BP Location): Left arm restriction  Pulse 77   Temp 98.4 F (36.9 C) (Oral)   Resp 20   SpO2 98%   Physical Exam Constitutional:      General: He is not in acute distress.    Appearance: Normal appearance. He is well-developed. He is not ill-appearing, toxic-appearing or diaphoretic.  HENT:     Head: Normocephalic and atraumatic.     Right Ear: External ear normal.     Left Ear: External ear normal.     Nose: Nose normal.     Mouth/Throat:     Mouth: Mucous membranes are moist.  Eyes:     General: No scleral icterus.       Right eye: No discharge.        Left eye: No discharge.     Extraocular Movements: Extraocular movements intact.  Cardiovascular:     Rate and Rhythm: Normal rate and regular rhythm.     Heart sounds: Normal heart sounds. No murmur heard.   No friction rub. No gallop.  Pulmonary:     Effort: No respiratory distress.     Breath sounds: No stridor. Rhonchi (mild over mid lung fields) present. No wheezing or rales.     Comments: Persistent coughing throughout visit. Neurological:     Mental Status: He is alert and oriented to person, place, and time.  Psychiatric:        Mood and Affect: Mood normal.        Behavior: Behavior normal.        Thought Content: Thought content normal.    Assessment and Plan :   PDMP not reviewed this encounter.  1. Persistent cough   2.  Wheezing   3. ESRD on dialysis ([place])   4. Chronic diastolic heart failure ([place])   5. Type 2 diabetes mellitus treated with insulin ([place])   6. History of pulmonary edema    Will pursue outpatient imaging, x-ray order placed.  We will base antibiotic use off of those results  especially since he is a dialysis patient.  I will avoid steroid use given his history of acute pulmonary edema, chronic diastolic heart failure.  For now, recommended supportive care with cough suppression medications.  We will follow-up with x-ray results and update treatment plan as necessary. Counseled patient on potential for adverse effects with medications prescribed/recommended today, ER and return-to-clinic precautions discussed, patient verbalized understanding.    Jaynee Eagles, [place] 02/14/22 1538

## 2022-02-21 ENCOUNTER — Other Ambulatory Visit (HOSPITAL_COMMUNITY): Payer: Self-pay

## 2022-02-25 ENCOUNTER — Other Ambulatory Visit (HOSPITAL_COMMUNITY): Payer: Self-pay

## 2022-02-25 ENCOUNTER — Telehealth: Payer: Self-pay | Admitting: Pharmacy Technician

## 2022-02-25 NOTE — Telephone Encounter (Addendum)
Patient Advocate Encounter   Received notification from CoverMyMeds that prior authorization for Humalog U200 is required by his/her insurance [employer].  Per Test Claim: Novolog or Fiasp preferred both with $10 copays   [facility] PA Case ID: [id number]

## 2022-02-25 NOTE — Telephone Encounter (Signed)
Will FWD to Dr. Kelton Pillar.

## 2022-02-26 NOTE — Telephone Encounter (Signed)
Patient Advocate Encounter   Received notification that prior authorization for HumaLOG KwikPen 200UNIT/ML pen-injectors is required.   PA submitted on 02/26/2022 Key [id number] Status is pending

## 2022-02-27 ENCOUNTER — Other Ambulatory Visit (HOSPITAL_COMMUNITY): Payer: Self-pay

## 2022-03-06 NOTE — Telephone Encounter (Signed)
Received notification that the request for prior authorization for HumaLOG KwikPen 200UNIT/ML pen-injectors has been denied due to We did not receive documentation that you meet the criteria outlined above. The policy states that this medication may be approved when: *The member is unable to take the required number of formulary alternatives for the given diagnosis due to an intolerance or contraindication OR *The member has tried and failed the required number of formulary alternatives. Formulary alternatives are: Claiborne Billings, Novolog. (Requirement: 3 in a class with 3 or more alternatives, 2 in a class with 2 alternatives, or 1 in a class with only 1 alternative.)

## 2022-03-10 ENCOUNTER — Other Ambulatory Visit: Payer: Self-pay | Admitting: Internal Medicine

## 2022-03-10 MED ORDER — INSULIN ASPART 100 UNIT/ML IJ SOLN: INTRAMUSCULAR | 3 refills | Status: AC

## 2022-03-10 NOTE — Telephone Encounter (Signed)
LMTCB

## 2022-03-10 NOTE — Telephone Encounter (Signed)
Patient advise and states that he doesn't need appointment with [facility]. He will contact us once he is out of the hospital to schedule follow up.

## 2022-06-24 ENCOUNTER — Encounter (INDEPENDENT_AMBULATORY_CARE_PROVIDER_SITE_OTHER): Payer: Medicare Other | Admitting: Ophthalmology

## 2022-07-01 ENCOUNTER — Encounter (INDEPENDENT_AMBULATORY_CARE_PROVIDER_SITE_OTHER): Payer: Medicare Other | Admitting: Ophthalmology

## 2022-12-02 IMAGING — CR DG CHEST 2V
2 series · 2 of 2 positions shown · non-contrast
Comparison: Chest x-ray dated April 14, 2016

CLINICAL DATA: Cough

EXAM:
CHEST - 2 VIEW

[w chest pa]
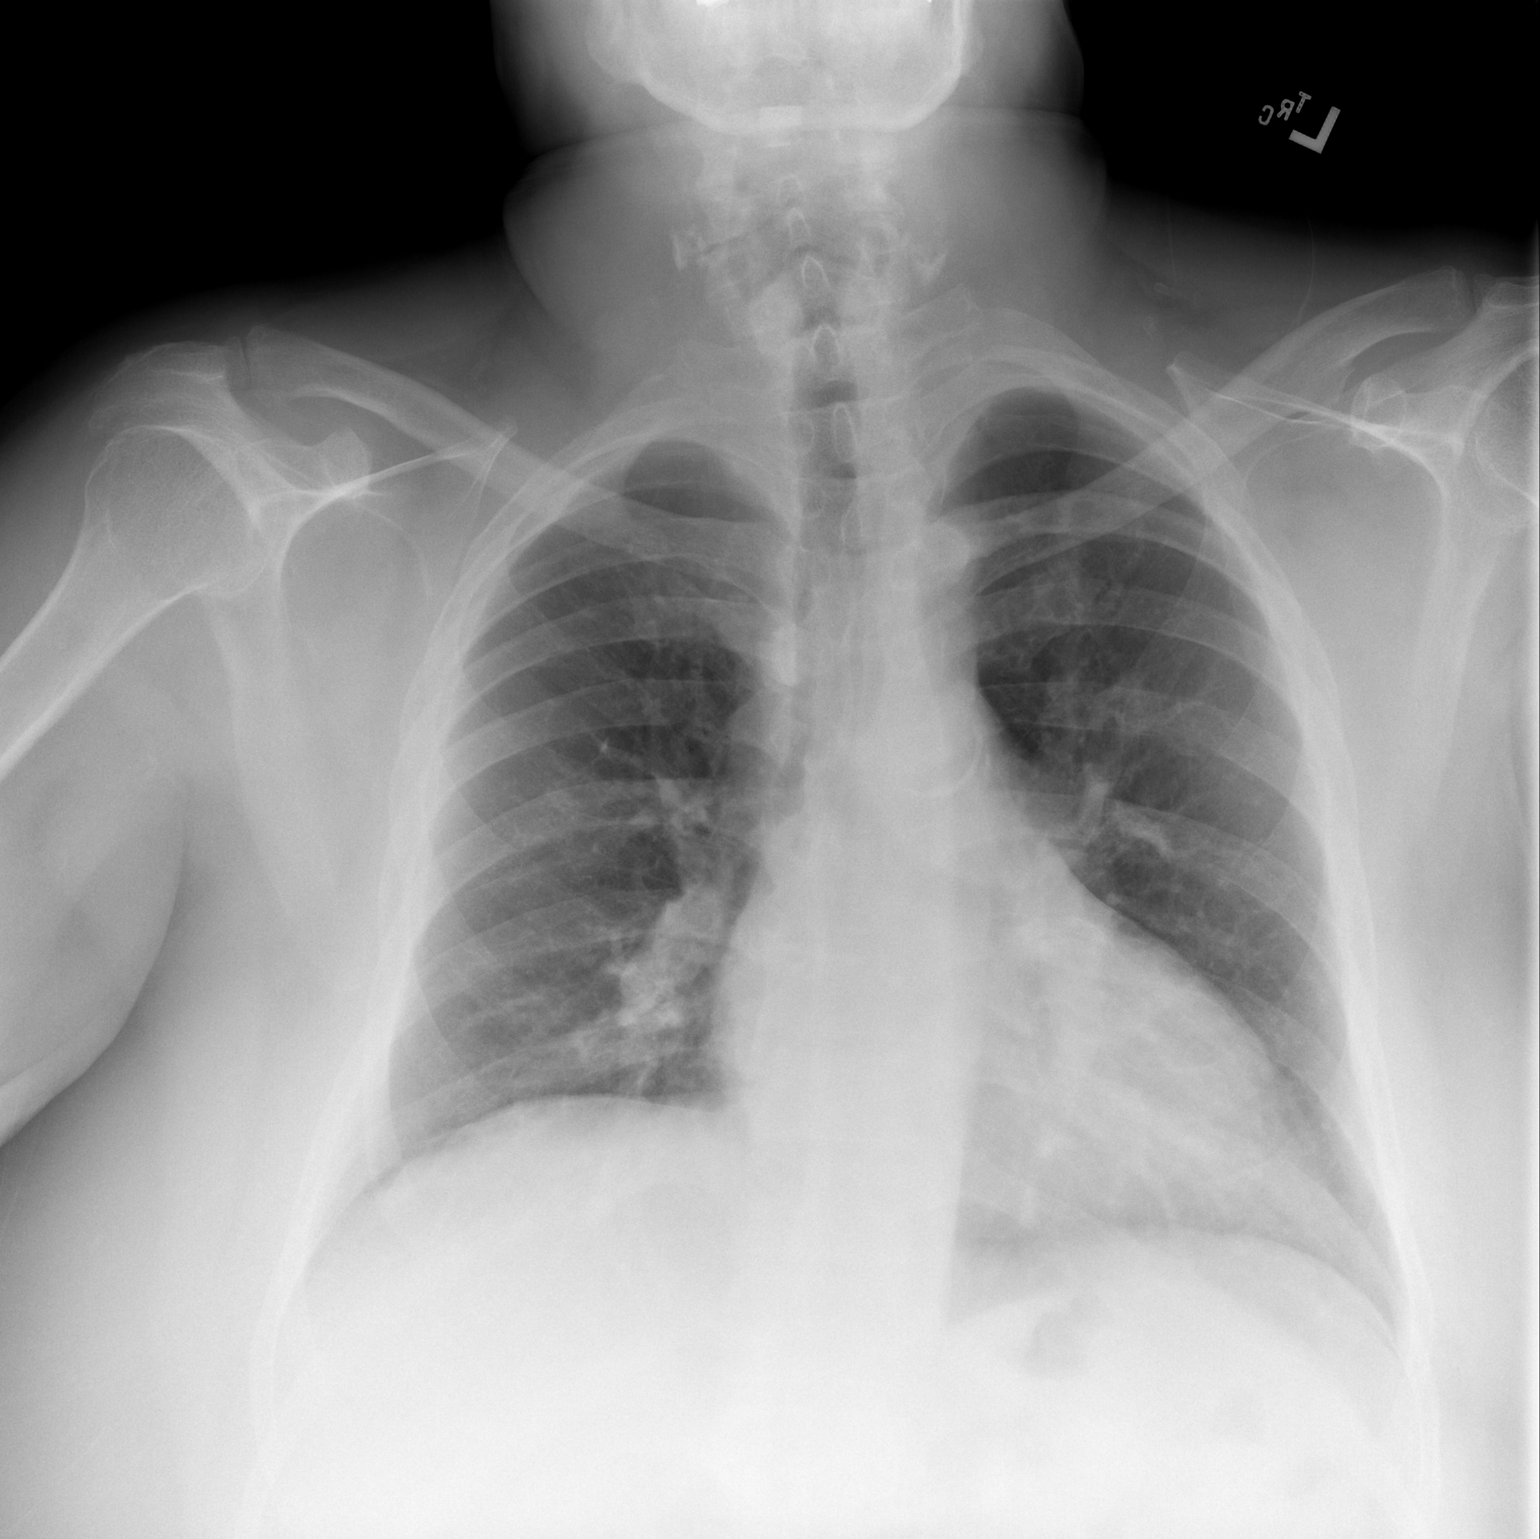

[w chest lat]
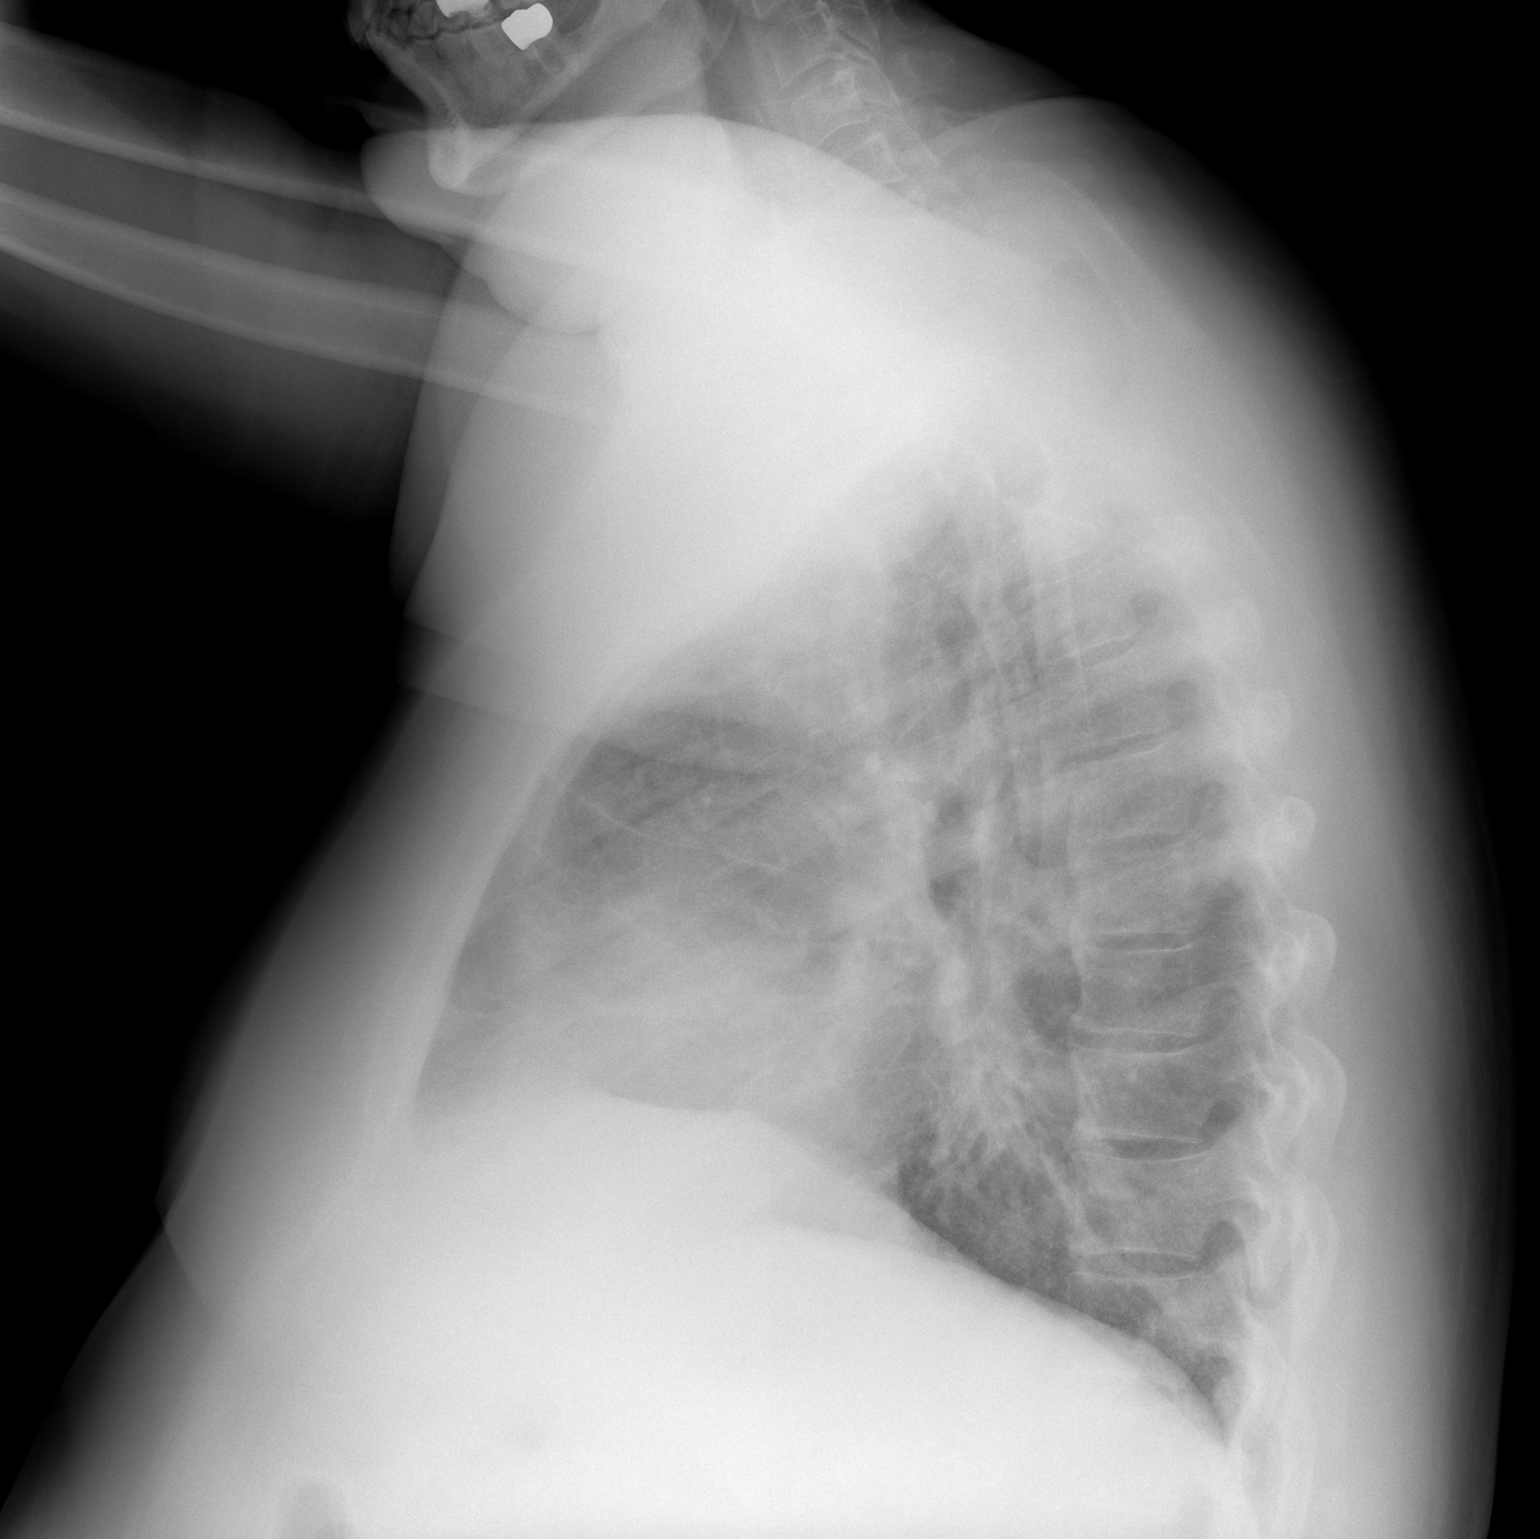

[2 of 2 positions shown; findings below may reference images not displayed]

FINDINGS: The heart size and mediastinal contours are within normal limits.
Both lungs are clear. The visualized skeletal structures are
unremarkable.
IMPRESSION: No active cardiopulmonary disease.
